# Patient Record
Sex: Female | Born: 1965
Health system: Southern US, Community
[De-identification: ages and names within clinical notes are randomized; demographics above are authoritative.]

## PROBLEM LIST (undated history)

## (undated) DIAGNOSIS — M5030 Other cervical disc degeneration, unspecified cervical region: Secondary | ICD-10-CM

## (undated) DIAGNOSIS — F32A Depression, unspecified: Secondary | ICD-10-CM

## (undated) DIAGNOSIS — R002 Palpitations: Secondary | ICD-10-CM

## (undated) DIAGNOSIS — F419 Anxiety disorder, unspecified: Secondary | ICD-10-CM

## (undated) DIAGNOSIS — M1990 Unspecified osteoarthritis, unspecified site: Secondary | ICD-10-CM

## (undated) DIAGNOSIS — D219 Benign neoplasm of connective and other soft tissue, unspecified: Secondary | ICD-10-CM

## (undated) DIAGNOSIS — Z905 Acquired absence of kidney: Secondary | ICD-10-CM

## (undated) DIAGNOSIS — M5126 Other intervertebral disc displacement, lumbar region: Secondary | ICD-10-CM

## (undated) DIAGNOSIS — E119 Type 2 diabetes mellitus without complications: Secondary | ICD-10-CM

## (undated) DIAGNOSIS — R0602 Shortness of breath: Secondary | ICD-10-CM

## (undated) DIAGNOSIS — K5900 Constipation, unspecified: Secondary | ICD-10-CM

## (undated) DIAGNOSIS — F329 Major depressive disorder, single episode, unspecified: Secondary | ICD-10-CM

## (undated) DIAGNOSIS — E739 Lactose intolerance, unspecified: Secondary | ICD-10-CM

## (undated) DIAGNOSIS — D649 Anemia, unspecified: Secondary | ICD-10-CM

## (undated) HISTORY — DX: Depression, unspecified: F32.A

## (undated) HISTORY — DX: Lactose intolerance, unspecified: E73.9

## (undated) HISTORY — DX: Unspecified osteoarthritis, unspecified site: M19.90

## (undated) HISTORY — DX: Other intervertebral disc displacement, lumbar region: M51.26

## (undated) HISTORY — DX: Constipation, unspecified: K59.00

## (undated) HISTORY — DX: Benign neoplasm of connective and other soft tissue, unspecified: D21.9

## (undated) HISTORY — DX: Anxiety disorder, unspecified: F41.9

## (undated) HISTORY — DX: Acquired absence of kidney: Z90.5

## (undated) HISTORY — PX: NEPHRECTOMY LIVING DONOR: SUR877

## (undated) HISTORY — DX: Shortness of breath: R06.02

## (undated) HISTORY — DX: Anemia, unspecified: D64.9

## (undated) HISTORY — DX: Palpitations: R00.2

## (undated) HISTORY — DX: Major depressive disorder, single episode, unspecified: F32.9

---

## 1993-05-09 HISTORY — PX: TUBAL LIGATION: SHX77

## 2005-05-22 HISTORY — PX: ABDOMINAL HYSTERECTOMY: SHX81

## 2007-09-12 DIAGNOSIS — Z905 Acquired absence of kidney: Secondary | ICD-10-CM

## 2007-09-12 HISTORY — DX: Acquired absence of kidney: Z90.5

## 2015-04-12 ENCOUNTER — Emergency Department (HOSPITAL_COMMUNITY)
Admission: EM | Admit: 2015-04-12 | Discharge: 2015-04-12 | Disposition: A | Discharge status: Home / Self Care | Payer: BLUE CROSS/BLUE SHIELD | Attending: Emergency Medicine | Admitting: Emergency Medicine

## 2015-04-12 ENCOUNTER — Encounter (HOSPITAL_COMMUNITY): Payer: Self-pay | Admitting: Emergency Medicine

## 2015-04-12 DIAGNOSIS — Z905 Acquired absence of kidney: Secondary | ICD-10-CM | POA: Insufficient documentation

## 2015-04-12 DIAGNOSIS — Z87448 Personal history of other diseases of urinary system: Secondary | ICD-10-CM | POA: Insufficient documentation

## 2015-04-12 DIAGNOSIS — M545 Low back pain: Secondary | ICD-10-CM | POA: Diagnosis present

## 2015-04-12 DIAGNOSIS — M5417 Radiculopathy, lumbosacral region: Secondary | ICD-10-CM | POA: Insufficient documentation

## 2015-04-12 MED ORDER — HYDROMORPHONE HCL 2 MG/ML IJ SOLN
2.0000 mg | Freq: Once | INTRAMUSCULAR | Status: AC
  Administered 2015-04-12: 2 mg via INTRAMUSCULAR
  Filled 2015-04-12: qty 1

## 2015-04-12 MED ORDER — OXYCODONE-ACETAMINOPHEN 5-325 MG PO TABS: 1.0000 | ORAL_TABLET | Freq: Four times a day (QID) | ORAL | Status: DC | PRN

## 2015-04-12 NOTE — ED Notes (Signed)
Pt c/o right sided leg pain x 6 days. She reports she was seen at another hospital and was given prednisone and hydrocodone for pain but is getting no relief.

## 2015-04-12 NOTE — ED Provider Notes (Signed)
CSN: [id number]     Arrival date & time 04/12/15  0408 History   First MD Initiated Contact with Patient 04/12/15 0413     Chief Complaint  Patient presents with  . Sciatica     (Consider location/radiation/quality/duration/timing/severity/associated sxs/prior Treatment) HPI Comments: Patient is a 49 year old female with history of solitary kidney. She presents with complaints of pain in her right buttock and right low back radiating into her leg. This is been present for the past week. She was seen at a hospital in [place] and started on prednisone and hydrocodone, however her pain is not improving. She denies any bowel or bladder complaints. She denies any weakness. She does report difficulty ambulating due to pain.  The history is provided by the patient.    Past Medical History  Diagnosis Date  . Renal disorder   . History of kidney removal    Past Surgical History  Procedure Laterality Date  . Kidney surgery    . Abdominal hysterectomy     No family history on file. Social History  Substance Use Topics  . Smoking status: Never Smoker   . Smokeless tobacco: None  . Alcohol Use: Yes     Comment: social   OB History    No data available     Review of Systems  All other systems reviewed and are negative.     Allergies  Review of patient's allergies indicates no known allergies.  Home Medications   Prior to Admission medications   Not on File   BP 146/91 mmHg  Pulse 80  Temp(Src) 97.4 F (36.3 C) (Oral)  Resp 18  SpO2 98% Physical Exam  Constitutional: She is oriented to person, place, and time. She appears well-developed and well-nourished. No distress.  HENT:  Head: Normocephalic and atraumatic.  Neck: Normal range of motion. Neck supple.  Musculoskeletal: Normal range of motion.  There is tenderness to palpation in the right buttock and right lumbar soft tissues.  Neurological: She is alert and oriented to person, place, and time.  Strength is 5  out of 5 in the bilateral lower extremities. DTRs are 2+ and symmetrical in the patellar and Achilles tendons. Is able to ambulate, however with discomfort.  Skin: Skin is warm and dry. She is not diaphoretic.  Nursing note and vitals reviewed.   ED Course  Procedures (including critical care time) Labs Review Labs Reviewed - No data to display  Imaging Review No results found. I have personally reviewed and evaluated these images and lab results as part of my medical decision-making.   EKG Interpretation None      MDM   Final diagnoses:  None    Patient presents with complaints that her consistent with a lumbosacral radiculopathy, possibly sciatica. There are no red flags in today's exam which would suggest an emergent situation. Her strength and reflexes are symmetrical and normal, and there are no bowel or bladder complaints. She will be given an injection of pain medication and discharged with Percocet. She is to follow-up with her primary doctor when she arrives at home in [place]. She is due to go back today.    Veryl Speak, MD 04/12/15 [phone]

## 2015-04-12 NOTE — Discharge Instructions (Signed)
Continue your prednisone as previously prescribed. Stop taking your hydrocodone.  Start Percocet as prescribed as needed for pain.  Follow-up with your primary doctor when you return home to discuss either further imaging or physical therapy if you're not improving.   Lumbosacral Radiculopathy Lumbosacral radiculopathy is a condition that involves the spinal nerves and nerve roots in the low back and bottom of the spine. The condition develops when these nerves and nerve roots move out of place or become inflamed and cause symptoms. CAUSES This condition may be caused by:  Pressure from a disk that bulges out of place (herniated disk). A disk is a plate of cartilage that separates bones in the spine.  Disk degeneration.  A narrowing of the bones of the lower back (spinal stenosis).  A tumor.  An infection.  An injury that places sudden pressure on the disks that cushion the bones of your lower spine. RISK FACTORS This condition is more likely to develop in:  Males aged 30-50 years.  Females aged 21-60 years.  People who lift improperly.  People who are overweight or live a sedentary lifestyle.  People who smoke.  People who perform repetitive activities that strain the spine. SYMPTOMS Symptoms of this condition include:  Pain that goes down from the back into the legs (sciatica). This is the most common symptom. The pain may be worse with sitting, coughing, or sneezing.  Pain and numbness in the arms and legs.  Muscle weakness.  Tingling.  Loss of bladder control or bowel control. DIAGNOSIS This condition is diagnosed with a physical exam and medical history. If the pain is lasting, you may have tests, such as:  MRI scan.  X-ray.  CT scan.  Myelogram.  Nerve conduction study. TREATMENT This condition is often treated with:  Hot packs and ice applied to affected areas.  Stretches to improve flexibility.  Exercises to strengthen back  muscles.  Physical therapy.  Pain medicine.  A steroid injection in the spine. In some cases, no treatment is needed. If the condition is long-lasting (chronic), or if symptoms are severe, treatment may involve surgery or lifestyle changes, such as following a weight loss plan. HOME CARE INSTRUCTIONS Medicines  Take medicines only as directed by your health care provider.  Do not drive or operate heavy machinery while taking pain medicine. Injury Care  Apply a heat pack to the injured area as directed by your health care provider.  Apply ice to the affected area:  Put ice in a plastic bag.  Place a towel between your skin and the bag.  Leave the ice on for 20-30 minutes, every 2 hours while you are awake or as needed. Or, leave the ice on for as long as directed by your health care provider. Other Instructions  If you were shown how to do any exercises or stretches, do them as directed by your health care provider.  If your health care provider prescribed a diet or exercise program, follow it as directed.  Keep all follow-up visits as directed by your health care provider. This is important. SEEK MEDICAL CARE IF:  Your pain does not improve over time even when taking pain medicines. SEEK IMMEDIATE MEDICAL CARE IF:  Your develop severe pain.  Your pain suddenly gets worse.  You develop increasing weakness in your legs.  You lose the ability to control your bladder or bowel.  You have difficulty walking or balancing.  You have a fever.   This information is not intended to  replace advice given to you by your health care provider. Make sure you discuss any questions you have with your health care provider.   Document Released: 05/08/2005 Document Revised: 09/22/2014 Document Reviewed: 05/04/2014 Elsevier Interactive Patient Education [employer].

## 2015-04-20 DIAGNOSIS — M5416 Radiculopathy, lumbar region: Secondary | ICD-10-CM | POA: Insufficient documentation

## 2015-06-18 DIAGNOSIS — G8929 Other chronic pain: Secondary | ICD-10-CM | POA: Insufficient documentation

## 2015-06-18 DIAGNOSIS — M5126 Other intervertebral disc displacement, lumbar region: Secondary | ICD-10-CM

## 2015-06-18 DIAGNOSIS — M545 Low back pain, unspecified: Secondary | ICD-10-CM | POA: Insufficient documentation

## 2015-06-18 HISTORY — DX: Other intervertebral disc displacement, lumbar region: M51.26

## 2015-06-28 DIAGNOSIS — M5416 Radiculopathy, lumbar region: Secondary | ICD-10-CM | POA: Diagnosis not present

## 2015-07-09 MED FILL — AMOXICILLIN 500 MG CAPSULE: 500 | 7 days supply | Qty: 30 | Fill #0

## 2015-07-13 DIAGNOSIS — M5127 Other intervertebral disc displacement, lumbosacral region: Secondary | ICD-10-CM | POA: Diagnosis not present

## 2015-07-13 HISTORY — PX: SPINE SURGERY: SHX786

## 2015-08-15 ENCOUNTER — Ambulatory Visit (INDEPENDENT_AMBULATORY_CARE_PROVIDER_SITE_OTHER): Payer: 59 | Admitting: Physician Assistant

## 2015-08-15 VITALS — BP 130/72 | HR 92 | Temp 97.9°F | Resp 16 | Ht 68.0 in | Wt 201.0 lb

## 2015-08-15 DIAGNOSIS — R05 Cough: Secondary | ICD-10-CM | POA: Diagnosis not present

## 2015-08-15 DIAGNOSIS — R059 Cough, unspecified: Secondary | ICD-10-CM

## 2015-08-15 DIAGNOSIS — J069 Acute upper respiratory infection, unspecified: Secondary | ICD-10-CM | POA: Diagnosis not present

## 2015-08-15 MED ORDER — PSEUDOEPHEDRINE-GUAIFENESIN ER 120-1200 MG PO TB12: 1.0000 | ORAL_TABLET | Freq: Two times a day (BID) | ORAL | Status: DC

## 2015-08-15 MED ORDER — BENZONATATE 100 MG PO CAPS: 100.0000 mg | ORAL_CAPSULE | Freq: Three times a day (TID) | ORAL | Status: DC | PRN

## 2015-08-15 NOTE — Patient Instructions (Signed)
Please take the mucinex-d twice daily for the next few days. Drink lots of water while you take this. Taking the tessalon every 8 hours will help with cough. If you're not better in 4 days or you start to feel worse with fevers please give [place] a call, you may require antibiotics at that time.   Upper Respiratory Infection, Adult Most upper respiratory infections (URIs) are a viral infection of the air passages leading to the lungs. A URI affects the nose, throat, and upper air passages. The most common type of URI is nasopharyngitis and is typically referred to as "the common cold." URIs run their course and usually go away on their own. Most of the time, a URI does not require medical attention, but sometimes a bacterial infection in the upper airways can follow a viral infection. This is called a secondary infection. Sinus and middle ear infections are common types of secondary upper respiratory infections. Bacterial pneumonia can also complicate a URI. A URI can worsen asthma and chronic obstructive pulmonary disease (COPD). Sometimes, these complications can require emergency medical care and may be life threatening.  CAUSES Almost all URIs are caused by viruses. A virus is a type of germ and can spread from one person to another.  RISKS FACTORS You may be at risk for a URI if:   You smoke.   You have chronic heart or lung disease.  You have a weakened defense (immune) system.   You are very young or very old.   You have nasal allergies or asthma.  You work in crowded or poorly ventilated areas.  You work in health care facilities or schools. SIGNS AND SYMPTOMS  Symptoms typically develop 2-3 days after you come in contact with a cold virus. Most viral URIs last 7-10 days. However, viral URIs from the influenza virus (flu virus) can last 14-18 days and are typically more severe. Symptoms may include:   Runny or stuffy (congested) nose.   Sneezing.   Cough.   Sore throat.    Headache.   Fatigue.   Fever.   Loss of appetite.   Pain in your forehead, behind your eyes, and over your cheekbones (sinus pain).  Muscle aches.  DIAGNOSIS  Your health care provider may diagnose a URI by:  Physical exam.  Tests to check that your symptoms are not due to another condition such as:  Strep throat.  Sinusitis.  Pneumonia.  Asthma. TREATMENT  A URI goes away on its own with time. It cannot be cured with medicines, but medicines may be prescribed or recommended to relieve symptoms. Medicines may help:  Reduce your fever.  Reduce your cough.  Relieve nasal congestion. HOME CARE INSTRUCTIONS   Take medicines only as directed by your health care provider.   Gargle warm saltwater or take cough drops to comfort your throat as directed by your health care provider.  Use a warm mist humidifier or inhale steam from a shower to increase air moisture. This may make it easier to breathe.  Drink enough fluid to keep your urine clear or pale yellow.   Eat soups and other clear broths and maintain good nutrition.   Rest as needed.   Return to work when your temperature has returned to normal or as your health care provider advises. You may need to stay home longer to avoid infecting others. You can also use a face mask and careful hand washing to prevent spread of the virus.  Increase the usage of your  inhaler if you have asthma.   Do not use any tobacco products, including cigarettes, chewing tobacco, or electronic cigarettes. If you need help quitting, ask your health care provider. PREVENTION  The best way to protect yourself from getting a cold is to practice good hygiene.   Avoid oral or hand contact with people with cold symptoms.   Wash your hands often if contact occurs.  There is no clear evidence that vitamin C, vitamin E, echinacea, or exercise reduces the chance of developing a cold. However, it is always recommended to get plenty  of rest, exercise, and practice good nutrition.  SEEK MEDICAL CARE IF:   You are getting worse rather than better.   Your symptoms are not controlled by medicine.   You have chills.  You have worsening shortness of breath.  You have brown or red mucus.  You have yellow or brown nasal discharge.  You have pain in your face, especially when you bend forward.  You have a fever.  You have swollen neck glands.  You have pain while swallowing.  You have white areas in the back of your throat. SEEK IMMEDIATE MEDICAL CARE IF:   You have severe or persistent:  Headache.  Ear pain.  Sinus pain.  Chest pain.  You have chronic lung disease and any of the following:  Wheezing.  Prolonged cough.  Coughing up blood.  A change in your usual mucus.  You have a stiff neck.  You have changes in your:  Vision.  Hearing.  Thinking.  Mood. MAKE SURE YOU:   Understand these instructions.  Will watch your condition.  Will get help right away if you are not doing well or get worse.   This information is not intended to replace advice given to you by your health care provider. Make sure you discuss any questions you have with your health care provider.   Document Released: 11/01/2000 Document Revised: 09/22/2014 Document Reviewed: 08/13/2013 Elsevier Interactive Patient Education [employer].

## 2015-08-15 NOTE — Progress Notes (Signed)
   Subjective:    Patient ID: Sarah Duncan, female    DOB: [birth year], 50 y.o.   MRN: [id number]  Chief Complaint  Patient presents with  . Cough    body aches/ cough since Monday   Medications, allergies, past medical history, surgical history, family history, social history and problem list reviewed and updated.  HPI  50 yof presents with above complaints.   Symptoms started gradually 6 days ago with non prod cough, sneezing, generalized body aches, chills, and pressure behind eyes. Persistent. No fevers. Denies sick contacts. Denies abd pain, n/v, diarrhea, otalgia, sore throat.   Review of Systems See HPI     Objective:   Physical Exam  Constitutional: She appears well-developed and well-nourished.  Non-toxic appearance. She does not have a sickly appearance. She does not appear ill. No distress.  BP 130/72 mmHg  Pulse 92  Temp(Src) 97.9 F (36.6 C)  Resp 16  Ht 5\' 8"  (1.727 m)  Wt 201 lb (91.173 kg)  BMI 30.57 kg/m2  SpO2 98%   HENT:  Right Ear: Tympanic membrane normal.  Left Ear: Tympanic membrane normal.  Nose: No mucosal edema or rhinorrhea. Right sinus exhibits no maxillary sinus tenderness and no frontal sinus tenderness. Left sinus exhibits no maxillary sinus tenderness and no frontal sinus tenderness.  Mouth/Throat: Uvula is midline, oropharynx is clear and moist and mucous membranes are normal.  Pulmonary/Chest: Effort normal and breath sounds normal. No tachypnea.      Assessment & Plan:   Viral URI - Plan: Pseudoephedrine-Guaifenesin (MUCINEX D) [phone] MG TB12  Cough - Plan: benzonatate (TESSALON) 100 MG capsule --suspect viral uri vs possible allergic rhinitis --mucinex d, tessalon, discussed flonase and daily antihistamine --let us know if no improvement 4-5 days or start feeling worse  Julieta Gutting, PA-C Physician Assistant-Certified [facility]  08/16/2015 9:17 AM

## 2015-08-16 ENCOUNTER — Encounter: Payer: Self-pay | Admitting: Physician Assistant

## 2015-10-12 ENCOUNTER — Encounter: Payer: Self-pay | Admitting: Physician Assistant

## 2015-10-12 ENCOUNTER — Ambulatory Visit (INDEPENDENT_AMBULATORY_CARE_PROVIDER_SITE_OTHER): Payer: 59 | Admitting: Family Medicine

## 2015-10-12 VITALS — BP 122/78 | HR 80 | Temp 98.2°F | Resp 16 | Ht 67.5 in | Wt 207.6 lb

## 2015-10-12 DIAGNOSIS — M545 Low back pain, unspecified: Secondary | ICD-10-CM

## 2015-10-12 DIAGNOSIS — Z13220 Encounter for screening for lipoid disorders: Secondary | ICD-10-CM | POA: Diagnosis not present

## 2015-10-12 DIAGNOSIS — R635 Abnormal weight gain: Secondary | ICD-10-CM

## 2015-10-12 DIAGNOSIS — Z7689 Persons encountering health services in other specified circumstances: Secondary | ICD-10-CM

## 2015-10-12 DIAGNOSIS — R6889 Other general symptoms and signs: Secondary | ICD-10-CM

## 2015-10-12 DIAGNOSIS — R002 Palpitations: Secondary | ICD-10-CM

## 2015-10-12 DIAGNOSIS — Z0000 Encounter for general adult medical examination without abnormal findings: Secondary | ICD-10-CM

## 2015-10-12 DIAGNOSIS — Z23 Encounter for immunization: Secondary | ICD-10-CM | POA: Diagnosis not present

## 2015-10-12 DIAGNOSIS — G8929 Other chronic pain: Secondary | ICD-10-CM

## 2015-10-12 DIAGNOSIS — Z13228 Encounter for screening for other metabolic disorders: Secondary | ICD-10-CM | POA: Diagnosis not present

## 2015-10-12 DIAGNOSIS — Z113 Encounter for screening for infections with a predominantly sexual mode of transmission: Secondary | ICD-10-CM

## 2015-10-12 DIAGNOSIS — Z114 Encounter for screening for human immunodeficiency virus [HIV]: Secondary | ICD-10-CM | POA: Diagnosis not present

## 2015-10-12 DIAGNOSIS — Z1329 Encounter for screening for other suspected endocrine disorder: Secondary | ICD-10-CM

## 2015-10-12 DIAGNOSIS — Z130 Encounter for screening for diseases of the blood and blood-forming organs and certain disorders involving the immune mechanism: Secondary | ICD-10-CM | POA: Diagnosis not present

## 2015-10-12 DIAGNOSIS — Z1159 Encounter for screening for other viral diseases: Secondary | ICD-10-CM | POA: Diagnosis not present

## 2015-10-12 DIAGNOSIS — Z905 Acquired absence of kidney: Secondary | ICD-10-CM

## 2015-10-12 LAB — CBC WITH DIFFERENTIAL/PLATELET
Basophils Absolute: 0 cells/uL (ref 0–200)
Basophils Relative: 0 %
EOS PCT: 2 %
Eosinophils Absolute: 122 cells/uL (ref 15–500)
HCT: 40.8 % (ref 35.0–45.0)
HEMOGLOBIN: 13.7 g/dL (ref 11.7–15.5)
LYMPHS ABS: 2745 {cells}/uL (ref 850–3900)
Lymphocytes Relative: 45 %
MCH: 28.2 pg (ref 27.0–33.0)
MCHC: 33.6 g/dL (ref 32.0–36.0)
MCV: 84.1 fL (ref 80.0–100.0)
MPV: 10.4 fL (ref 7.5–12.5)
Monocytes Absolute: 610 cells/uL (ref 200–950)
Monocytes Relative: 10 %
NEUTROS ABS: 2623 {cells}/uL (ref 1500–7800)
Neutrophils Relative %: 43 %
PLATELETS: 260 10*3/uL (ref 140–400)
RBC: 4.85 MIL/uL (ref 3.80–5.10)
RDW: 13.8 % (ref 11.0–15.0)
WBC: 6.1 10*3/uL (ref 3.8–10.8)

## 2015-10-12 LAB — POC MICROSCOPIC URINALYSIS (UMFC): MUCUS RE: ABSENT

## 2015-10-12 LAB — LIPID PANEL
CHOL/HDL RATIO: 3.6 ratio (ref <=5.0)
Cholesterol: 195 mg/dL (ref 125–200)
HDL: 54 mg/dL (ref >=46)
LDL CALC: 122 mg/dL (ref <130)
TRIGLYCERIDES: 96 mg/dL (ref <150)
VLDL: 19 mg/dL (ref <30)

## 2015-10-12 LAB — COMPREHENSIVE METABOLIC PANEL
ALBUMIN: 4.3 g/dL (ref 3.6–5.1)
ALT: 5 U/L — ABNORMAL LOW (ref 6–29)
AST: 13 U/L (ref 10–35)
Alkaline Phosphatase: 106 U/L (ref 33–115)
BILIRUBIN TOTAL: 0.3 mg/dL (ref 0.2–1.2)
BUN: 10 mg/dL (ref 7–25)
CO2: 23 mmol/L (ref 20–31)
CREATININE: 1.01 mg/dL (ref 0.50–1.10)
Calcium: 9.9 mg/dL (ref 8.6–10.2)
Chloride: 102 mmol/L (ref 98–110)
Glucose, Bld: 94 mg/dL (ref 65–99)
Potassium: 4.1 mmol/L (ref 3.5–5.3)
SODIUM: 137 mmol/L (ref 135–146)
TOTAL PROTEIN: 7.5 g/dL (ref 6.1–8.1)

## 2015-10-12 LAB — POCT URINALYSIS DIP (MANUAL ENTRY)
BILIRUBIN UA: NEGATIVE
BILIRUBIN UA: NEGATIVE
Blood, UA: NEGATIVE
GLUCOSE UA: NEGATIVE
LEUKOCYTES UA: NEGATIVE
Nitrite, UA: NEGATIVE
Protein Ur, POC: NEGATIVE
SPEC GRAV UA: 1.02
Urobilinogen, UA: 0.2
pH, UA: 5.5

## 2015-10-12 NOTE — Patient Instructions (Signed)
Check the dose of gabapentin-we may be able to increase it.  Bring the records from your hospitalization after your back surgery and a copy of the neurosurgeon's note.    IF you received an x-ray today, you will receive an invoice from [facility]. Please contact [facility] at [phone] with questions or concerns regarding your invoice.   IF you received labwork today, you will receive an invoice from [employer]. Please contact [facility] at [phone] with questions or concerns regarding your invoice.   Our billing staff will not be able to assist you with questions regarding bills from these companies.  You will be contacted with the lab results as soon as they are available. The fastest way to get your results is to activate your My Chart account. Instructions are located on the last page of this paperwork. If you have not heard from [place] regarding the results in 2 weeks, please contact this office.

## 2015-10-12 NOTE — Progress Notes (Signed)
Patient ID: Sarah Duncan, female    DOB: 1965-10-24, 50 y.o.   MRN: [id number]  PCP: No PCP Per Patient  Chief Complaint  Patient presents with  . Annual Exam    Subjective:   HPI: Presents to establish for primary care and for Annual Wellness visit. She is accompanied by her wife, Denyse Dago.  Cervical Cancer Screening: no longer a candidate. Breast Cancer Screening: mammogram in 2008 in preparation for renal transplant; lump, benign by biopsy. Colorectal Cancer Screening: not yet a candidate Bone Density Testing: not yet a candidate HIV Screening: due STI Screening: due Seasonal Influenza Vaccination: "I don't do 'em" Td/Tdap Vaccination: unknown last dose  Pneumococcal Vaccination: not yet a candidate Zoster Vaccination: not yet a candidate Frequency of Dental evaluation: complete edentula, fully compensdated. Frequency of Eye evaluation: last visit 2 years ago.   Patient Active Problem List   Diagnosis Date Noted  . Chronic lower back pain 06/18/2015    Past Medical History  Diagnosis Date  . History of kidney removal   . 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 Fibroids      Prior to Admission medications   Medication Sig Start Date End Date Taking? Authorizing Provider  Acetaminophen (TYLENOL ARTHRITIS PAIN PO) Take 650 mg by mouth daily.   Yes Historical Provider, MD  acetaminophen (TYLENOL) 650 MG CR tablet Take 650 mg by mouth every 8 (eight) hours as needed for pain.   Yes Historical Provider, MD  BIOTIN PO Take by mouth daily.   Yes Historical Provider, MD  Gabapentin (NEURONTIN PO) Take by mouth 3 (three) times daily.   Yes Historical Provider, MD  MELATONIN PO Take by mouth daily.   Yes Historical Provider, MD  Multiple Vitamin (MULTIVITAMIN) tablet Take 1 tablet by mouth daily.   Yes Historical Provider, MD    No Known Allergies  Past Surgical History  Procedure Laterality Date  . 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 Cesarean section  11/19/1989  . Tubal ligation  05/09/1993  . Abdominal hysterectomy  2007  . Spine surgery  07/13/2015    Family History  Problem Relation Age of Onset  . Diabetes Mother   . Hypertension Mother   . Diabetes Brother   . Hypertension Brother   . Cancer Father   . Endometriosis Daughter   . Diabetes Daughter   . HIV Son     Social History   Social History  . Marital Status: Married    Spouse Name: Ronni Rumble  . Number of Children: 4  . Years of Education: associates   Occupational History  . [employer]     out of work since 03/2015   Social History Main Topics  . Smoking status: Former Smoker -- 1 years    Types: Cigarettes  . Smokeless tobacco: Never Used  . Alcohol Use: 0.6 oz/week    1 Standard drinks or equivalent per week     Comment: social  . Drug Use: No  . Sexual Activity:    Partners: Female   Other Topics Concern  . None   Social History Narrative   Divorced. 4 adult children (3 in [place], one in [place]).   Re-Married 05/2015. Lives with her wife.   Her wife's children live locally.       Review of Systems  Constitutional: Positive for unexpected weight change (gain, since 03/2015; more eating, less exercise (my work was my exercise);  milkshakes). Negative for fever, chills, diaphoresis, activity change, appetite change and fatigue.  HENT: Negative.   Eyes: Positive for visual disturbance (has bifocals, doesn't wear them every day, last eye exam 2 years ago.). Negative for photophobia, pain, discharge, redness and itching.  Respiratory: Negative.   Cardiovascular: Positive for palpitations (began in 2010, wore a monitor, pacemaker considered, but she thinks it was due to stress at the time; have recurred with recent stress). Negative for chest pain and leg swelling.  Gastrointestinal: Negative.   Endocrine: Positive for cold intolerance. Negative for heat intolerance, polydipsia, polyphagia and  polyuria.  Genitourinary: Negative.   Musculoskeletal: Positive for back pain. Negative for myalgias, joint swelling, arthralgias, gait problem, neck pain and neck stiffness.  Skin: Negative.   Allergic/Immunologic: Negative.   Neurological: Positive for dizziness (occurs when she skips meals) and light-headedness (once a day; rapid position changes). Negative for tremors, seizures, syncope, facial asymmetry, speech difficulty, weakness, numbness and headaches.       "I have been told I was anemic."  Hematological: Negative.   Psychiatric/Behavioral: Negative for suicidal ideas, hallucinations, behavioral problems, confusion, sleep disturbance, self-injury, dysphoric mood, decreased concentration and agitation. The patient is not hyperactive. Nervous/anxious: back pain, job, finances.         Objective:  Physical Exam  Constitutional: She is oriented to person, place, and time. Vital signs are normal. She appears well-developed and well-nourished. She is active and cooperative. No distress.  BP 122/78 mmHg  Pulse 80  Temp(Src) 98.2 F (36.8 C) (Oral)  Resp 16  Ht 5' 7.5" (1.715 m)  Wt 207 lb 9.6 oz (94.167 kg)  BMI 32.02 kg/m2  SpO2 98%   HENT:  Head: Normocephalic and atraumatic.  Right Ear: Hearing, tympanic membrane, external ear and ear canal normal. No foreign bodies.  Left Ear: Hearing, tympanic membrane, external ear and ear canal normal. No foreign bodies.  Nose: Nose normal.  Mouth/Throat: Uvula is midline, oropharynx is clear and moist and mucous membranes are normal. She has dentures. No oral lesions. Normal dentition. No dental abscesses or uvula swelling. No oropharyngeal exudate.  Eyes: Conjunctivae, EOM and lids are normal. Pupils are equal, round, and reactive to light. Right eye exhibits no discharge. Left eye exhibits no discharge. No scleral icterus.  Fundoscopic exam:      The right eye shows no arteriolar narrowing, no AV nicking, no exudate, no hemorrhage and  no papilledema. The right eye shows red reflex.       The left eye shows no arteriolar narrowing, no AV nicking, no exudate, no hemorrhage and no papilledema. The left eye shows red reflex.  Neck: Trachea normal, normal range of motion and full passive range of motion without pain. Neck supple. No spinous process tenderness and no muscular tenderness present. No thyroid mass and no thyromegaly present.  Cardiovascular: Normal rate, regular rhythm, normal heart sounds, intact distal pulses and normal pulses.   Pulmonary/Chest: Effort normal and breath sounds normal. Right breast exhibits no inverted nipple, no mass, no nipple discharge, no skin change and no tenderness. Left breast exhibits no inverted nipple, no mass, no nipple discharge, no skin change and no tenderness. Breasts are symmetrical.    Musculoskeletal: She exhibits no edema or tenderness.       Cervical back: Normal.       Thoracic back: Normal.       Lumbar back: Normal.  Lymphadenopathy:       Head (right side): No tonsillar, no preauricular, no posterior auricular  and no occipital adenopathy present.       Head (left side): No tonsillar, no preauricular, no posterior auricular and no occipital adenopathy present.    She has no cervical adenopathy.       Right: No supraclavicular adenopathy present.       Left: No supraclavicular adenopathy present.  Neurological: She is alert and oriented to person, place, and time. She has normal strength and normal reflexes. No cranial nerve deficit. She exhibits normal muscle tone. Coordination and gait normal.  Skin: Skin is warm, dry and intact. No rash noted. She is not diaphoretic. No cyanosis or erythema. Nails show no clubbing.  Psychiatric: She has a normal mood and affect. Her speech is normal and behavior is normal. Judgment and thought content normal.      EKG reviewed with Dr. Tamala Julian. NSR.     Assessment & Plan:  1. Annual physical exam 2. Encounter to establish care Age  appropriate anticipatory guidance provided.  3. Need for Tdap vaccination - Tdap vaccine greater than or equal to 7yo IM  4. Screening for HIV (human immunodeficiency virus) - HIV antibody  5. Routine screening for STI (sexually transmitted infection) - GC/Chlamydia Probe Amp - Hepatitis B surface antibody - Hepatitis B surface antigen - Hepatitis C antibody - RPR  6. Screening for hyperlipidemia - Lipid panel  7. Screening for thyroid disorder See below  8. Screening for deficiency anemia - CBC with Differential/Platelet  9. Screening for metabolic disorder - Comprehensive metabolic panel - POCT urinalysis dipstick - POCT Microscopic Urinalysis ([facility])  10. Single kidney - Comprehensive metabolic panel  11. Chronic lower back pain She will get her previous records regarding this and the surgery she had recently. - Ambulatory referral to Physical Therapy  12. Weight gain Likely due to reduced activity and increased caloric intake. - TSH  13. Cold intolerance Await TSH. - TSH  14. Heart palpitations Normal EKG here today. Get previous records. Likely stress induced. - TSH - EKG 12-Lead   Fara Chute, PA-C Physician Assistant-Certified [facility]

## 2015-10-13 LAB — HIV ANTIBODY (ROUTINE TESTING W REFLEX): HIV 1&2 Ab, 4th Generation: NONREACTIVE

## 2015-10-13 LAB — HEPATITIS C ANTIBODY: HCV Ab: NEGATIVE

## 2015-10-13 LAB — TSH: TSH: 2.79 mIU/L

## 2015-10-13 LAB — GC/CHLAMYDIA PROBE AMP
CT Probe RNA: NOT DETECTED
GC Probe RNA: NOT DETECTED

## 2015-10-13 LAB — RPR

## 2015-10-13 LAB — HEPATITIS B SURFACE ANTIBODY, QUANTITATIVE: Hepatitis B-Post: 0 m[IU]/mL

## 2015-10-13 LAB — HEPATITIS B SURFACE ANTIGEN: HEP B S AG: NEGATIVE

## 2015-10-14 ENCOUNTER — Encounter: Payer: Self-pay | Admitting: Physician Assistant

## 2015-10-19 ENCOUNTER — Telehealth: Payer: Self-pay

## 2015-10-19 NOTE — Telephone Encounter (Signed)
Received 34 pages from Dr. Jacqlyn Larsen office

## 2015-10-28 ENCOUNTER — Encounter: Payer: Self-pay | Admitting: Physical Therapy

## 2015-10-28 ENCOUNTER — Ambulatory Visit: Payer: 59 | Attending: Family Medicine | Admitting: Physical Therapy

## 2015-10-28 DIAGNOSIS — R262 Difficulty in walking, not elsewhere classified: Secondary | ICD-10-CM | POA: Insufficient documentation

## 2015-10-28 DIAGNOSIS — R252 Cramp and spasm: Secondary | ICD-10-CM | POA: Diagnosis not present

## 2015-10-28 DIAGNOSIS — M545 Low back pain, unspecified: Secondary | ICD-10-CM

## 2015-10-28 NOTE — Therapy (Signed)
[place], [place], [address] Phone: [phone]   Fax:  [phone]  Physical Therapy Evaluation  Patient Details  Name: Sarah Duncan MRN: [id number] Date of Birth: 02-02-1966 Referring Provider: C. Jacqulynn Cadet  Encounter Date: 10/28/2015      PT End of Session - 10/28/15 1129    Visit Number 1   Date for PT Re-Evaluation 12/28/15   PT Start Time 1100   PT Stop Time 1155   PT Time Calculation (min) 55 min   Activity Tolerance Patient tolerated treatment well   Behavior During Therapy [facility] for tasks assessed/performed      Past Medical History  Diagnosis Date  . History of kidney removal   . 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 Fibroids     Past Surgical History  Procedure Laterality Date  . Nephrectomy living donor Left     donated to her cousin  . Cesarean section  11/19/1989  . Tubal ligation  05/09/1993  . Abdominal hysterectomy  2007  . Spine surgery  07/13/2015    There were no vitals filed for this visit.       Subjective Assessment - 10/28/15 1104    Subjective Patient reports that she has had low back pain for a few years.  She was found to have a ruptured disc and underwent a lumbar surgery, she is unsure of the type of surgery but denies fusion.  Reports that since the surgery the pain in her right leg is gone but now pain and pressure in the low back   Limitations Standing;Sitting;House hold activities   How long can you sit comfortably? 45 minutes   How long can you stand comfortably? 10 minutes   How long can you walk comfortably? 10 minutes   Patient Stated Goals have less pain   Currently in Pain? Yes   Pain Score 8    Pain Location Back   Pain Orientation Lower   Pain Descriptors / Indicators Aching   Pain Type Chronic pain   Pain Onset More than a month ago   Pain Frequency Constant   Aggravating Factors  standing and walking >  10 minutes pain will be up to 10/10, does not take pain meds due to only having one kidney   Pain Relieving Factors really reports nothing helps but at best a 6/10    Effect of Pain on Daily Activities limits everything            [facility] PT Assessment - 10/28/15 0001    Assessment   Medical Diagnosis LBP   Referring Provider Darlin Coco   Onset Date/Surgical Date 09/27/15   Prior Therapy no   Precautions   Precautions None   Balance Screen   Has the patient fallen in the past 6 months No   Has the patient had a decrease in activity level because of a fear of falling?  No   Is the patient reluctant to leave their home because of a fear of falling?  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#, recently was let go secondary to being out with the LBP since November   Leisure no exercise  Posture/Postural Control   Posture Comments increased lordosis   ROM / Strength   AROM / PROM / Strength AROM;Strength   AROM   Overall AROM Comments Lumbar ROM was decreased >75% with low back pain   Strength   Overall Strength Comments 3+/5 for the LE's with some LBP   Flexibility   Soft Tissue Assessment /Muscle Length --  she is very tight in the calves, HS and piriformis mms   Palpation   Palpation comment very tender in the lumbar parapsinals and into the buttocks                   [facility] Adult PT Treatment/Exercise - 10/28/15 0001    Modalities   Modalities Moist Heat;Electrical Stimulation   Moist Heat Therapy   Number Minutes Moist Heat 15 Minutes   Moist Heat Location Lumbar Spine   Electrical Stimulation   Electrical Stimulation Location Lumbar area   Electrical Stimulation Action IFC   Electrical Stimulation Parameters sitting   Electrical Stimulation  Goals Pain                PT Education - 10/28/15 1128    Education provided Yes   Education Details Wms flexion exercise   Person(s) Educated Patient   Methods Explanation;Demonstration;Handout   Comprehension Verbalized understanding;Returned demonstration          PT Short Term Goals - 10/28/15 1133    PT SHORT TERM GOAL #1   Title independent with initial HEP   Time 2   Period Weeks   Status New           PT Long Term Goals - 10/28/15 1133    PT LONG TERM GOAL #1   Title understand posture and body mechanics   Time 8   Period Weeks   Status New   PT LONG TERM GOAL #2   Title decrease pain 50%   Time 8   Period Weeks   Status New   PT LONG TERM GOAL #3   Title increase lumbar ROM 50%   Time 8   Period Weeks   Status New   PT LONG TERM GOAL #4   Title tolerate grocery shopping without increase of pain   Time 8   Period Weeks   Status New   PT LONG TERM GOAL #5   Title increase LE strength to 4/5   Time 8   Period Weeks   Status New               Plan - 10/28/15 1129    Clinical Impression Statement Patient reports LBP for a number of years, she reports she had a back surgery in February.  She reports that it helped the leg pain she was having but she has continued to have LBP, pain is rated high with minimal changes.  She is very limited in her ROM.   Rehab Potential Good   PT Frequency 2x / week   PT Duration 8 weeks   PT Treatment/Interventions ADLs/Self Care Home Management;Cryotherapy;Electrical Stimulation;Moist Heat;Therapeutic exercise;Therapeutic activities;Functional mobility training;Ultrasound;Traction;Neuromuscular re-education;Patient/family education;Manual techniques   PT Next Visit Plan slowly add exercises, could try traction   Consulted and Agree with Plan of Care Patient      Patient will benefit from skilled therapeutic intervention in order to improve the following deficits and impairments:  Decreased mobility,  Decreased range of motion, Difficulty walking, Decreased strength, Impaired flexibility, Increased muscle spasms, Pain, Improper body mechanics  Visit Diagnosis: Bilateral low  back pain without sciatica - Plan: PT plan of care cert/re-cert  Difficulty in walking, not elsewhere classified - Plan: PT plan of care cert/re-cert  Cramp and spasm - Plan: PT plan of care cert/re-cert     Problem List Patient Active Problem List   Diagnosis Date Noted  . 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 Chronic lower back pain 06/18/2015    Sumner Boast., PT 10/28/2015, 11:36 AM  [address] [place], [place], [address] Phone: [phone]   Fax:  [phone]  Name: Sarah Duncan MRN: [id number] Date of Birth: April 29, 1966

## 2015-11-03 ENCOUNTER — Ambulatory Visit: Payer: 59 | Admitting: Physical Therapy

## 2015-11-09 ENCOUNTER — Encounter: Payer: Self-pay | Admitting: Physician Assistant

## 2015-11-11 ENCOUNTER — Ambulatory Visit: Payer: 59 | Admitting: Physical Therapy

## 2015-11-11 ENCOUNTER — Encounter: Payer: Self-pay | Admitting: Physical Therapy

## 2015-11-11 DIAGNOSIS — M545 Low back pain, unspecified: Secondary | ICD-10-CM

## 2015-11-11 DIAGNOSIS — R252 Cramp and spasm: Secondary | ICD-10-CM | POA: Diagnosis not present

## 2015-11-11 DIAGNOSIS — R262 Difficulty in walking, not elsewhere classified: Secondary | ICD-10-CM | POA: Diagnosis not present

## 2015-11-11 NOTE — Therapy (Signed)
[place], [place], [address] Phone: [phone]   Fax:  [phone]  Physical Therapy Treatment  Patient Details  Name: Sarah Duncan MRN: [id number] Date of Birth: 1966-03-04 Referring Provider: C. Jacqulynn Cadet  Encounter Date: 11/11/2015      PT End of Session - 11/11/15 1132    Visit Number 2   Date for PT Re-Evaluation 12/28/15   PT Start Time 1100   PT Stop Time 1148   PT Time Calculation (min) 48 min   Activity Tolerance Patient tolerated treatment well   Behavior During Therapy [facility] for tasks assessed/performed      Past Medical History  Diagnosis Date  . History of kidney removal   . 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 Fibroids     Past Surgical History  Procedure Laterality Date  . Nephrectomy living donor Left     donated to her cousin  . Cesarean section  11/19/1989  . Tubal ligation  05/09/1993  . Abdominal hysterectomy  2007  . Spine surgery  07/13/2015    There were no vitals filed for this visit.      Subjective Assessment - 11/11/15 1105    Subjective Pt reports no change since evaluation. Pt report that doing her HEP hurts   Currently in Pain? Yes   Pain Score 7    Pain Location Back   Pain Orientation Lower                         [facility] Adult PT Treatment/Exercise - 11/11/15 0001    Exercises   Exercises Lumbar;Knee/Hip   Lumbar Exercises: Stretches   Passive Hamstring Stretch 3 reps;10 seconds  seated position   Piriformis Stretch 3 reps;10 seconds  seated   Lumbar Exercises: Machines for Strengthening   Cybex Knee Extension 5lb x5   Cybex Knee Flexion 15lb x5   Other Lumbar Machine Exercise Seated rows & lats 20lb 2x10   Knee/Hip Exercises: Aerobic   Nustep L4 x6 minutes   Modalities   Modalities Moist Heat;Electrical Stimulation   Moist Heat Therapy   Number Minutes Moist Heat 15 Minutes   Moist  Heat Location Lumbar Spine   Electrical Stimulation   Electrical Stimulation Location Lumbar area   Electrical Stimulation Action IFC   Electrical Stimulation Parameters sitting   Electrical Stimulation Goals Pain                  PT Short Term Goals - 10/28/15 1133    PT SHORT TERM GOAL #1   Title independent with initial HEP   Time 2   Period Weeks   Status New           PT Long Term Goals - 10/28/15 1133    PT LONG TERM GOAL #1   Title understand posture and body mechanics   Time 8   Period Weeks   Status New   PT LONG TERM GOAL #2   Title decrease pain 50%   Time 8   Period Weeks   Status New   PT LONG TERM GOAL #3   Title increase lumbar ROM 50%   Time 8   Period Weeks   Status New   PT LONG TERM GOAL #4   Title tolerate grocery shopping without increase of pain   Time 8   Period Weeks   Status New   PT  LONG TERM GOAL #5   Title increase LE strength to 4/5   Time 8   Period Weeks   Status New               Plan - 11/11/15 1133    Clinical Impression Statement Pt with pain throughout treatment. Pt reports that lying down causes increase pain, "I sleep sitting up in the bed". Pt reports only minor increase in pain with scapular stabilization intervention. Increase pain with leg curl and extensions. Pt also reports increase back pain with LE stretches in seated position.    Rehab Potential Good   PT Frequency 2x / week   PT Duration 8 weeks   PT Treatment/Interventions ADLs/Self Care Home Management;Cryotherapy;Electrical Stimulation;Moist Heat;Therapeutic exercise;Therapeutic activities;Functional mobility training;Ultrasound;Traction;Neuromuscular re-education;Patient/family education;Manual techniques   PT Next Visit Plan Try to get pt moving.      Patient will benefit from skilled therapeutic intervention in order to improve the following deficits and impairments:  Decreased mobility, Decreased range of motion, Difficulty walking,  Decreased strength, Impaired flexibility, Increased muscle spasms, Pain, Improper body mechanics  Visit Diagnosis: Bilateral low back pain without sciatica  Cramp and spasm  Difficulty in walking, not elsewhere classified     Problem List Patient Active Problem List   Diagnosis Date Noted  . 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 Chronic lower back pain 06/18/2015    Scot Jun, PTA  11/11/2015, 11:40 AM  [place], [place], [address] Phone: [phone]   Fax:  [phone]  Name: Sarah Duncan MRN: [id number] Date of Birth: July 16, 1965

## 2015-11-17 NOTE — Progress Notes (Signed)
History and physical examinations reviewed in detail with PA Jacqulynn Cadet.  EKG reviewed during visit. Agree with assessment and plan. Redford Behrle Elayne Guerin, M.D. [facility] [address] [place], [place]  [address] [phone] phone [phone] fax

## 2015-11-18 ENCOUNTER — Encounter: Payer: Self-pay | Admitting: Physical Therapy

## 2015-11-18 ENCOUNTER — Ambulatory Visit: Payer: 59 | Admitting: Physical Therapy

## 2015-11-18 ENCOUNTER — Telehealth: Payer: Self-pay

## 2015-11-18 DIAGNOSIS — R262 Difficulty in walking, not elsewhere classified: Secondary | ICD-10-CM

## 2015-11-18 DIAGNOSIS — R252 Cramp and spasm: Secondary | ICD-10-CM

## 2015-11-18 DIAGNOSIS — M545 Low back pain, unspecified: Secondary | ICD-10-CM

## 2015-11-18 NOTE — Telephone Encounter (Signed)
Patient needs forms completed based off her last CPE I will place them in Sarah Duncan's box on 11/18/15 if you could please return them to the FMLA/Disability box at the 102 checkout desk within 5-7 business days. Thank you!

## 2015-11-18 NOTE — Therapy (Signed)
[place], [place], [address] Phone: [phone]   Fax:  [phone]  Physical Therapy Treatment  Patient Details  Name: Sarah Duncan MRN: [id number] Date of Birth: 1965-09-17 Referring Provider: C. Jacqulynn Cadet  Encounter Date: 11/18/2015      PT End of Session - 11/18/15 1039    Visit Number 3   Date for PT Re-Evaluation 12/28/15   PT Start Time [id number]   PT Stop Time 1055   PT Time Calculation (min) 40 min   Activity Tolerance Patient limited by pain   Behavior During Therapy [facility] for tasks assessed/performed      Past Medical History  Diagnosis Date  . History of kidney removal   . 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 Fibroids     Past Surgical History  Procedure Laterality Date  . Nephrectomy living donor Left     donated to her cousin  . Cesarean section  11/19/1989  . Tubal ligation  05/09/1993  . Abdominal hysterectomy  2007  . Spine surgery  07/13/2015    There were no vitals filed for this visit.      Subjective Assessment - 11/18/15 1015    Subjective "I think I over did it a few days ago, I [place]" Pt reports that she has been in increase pain since   Currently in Pain? Yes   Pain Score 9    Pain Location Back                         [facility] Adult PT Treatment/Exercise - 11/18/15 0001    Lumbar Exercises: Seated   Long Arc Quad on Chair 1 set;Both;10 reps   Knee/Hip Exercises: Aerobic   Nustep L3 x6 minutes   Knee/Hip Exercises: Seated   Ball Squeeze x20   Other Seated Knee/Hip Exercises Seated march x10  Lside caused more pain   Hamstring Curl Both;1 set;10 reps   Hamstring Limitations red Tband    Modalities   Modalities Moist Heat;Electrical Stimulation   Moist Heat Therapy   Number Minutes Moist Heat 15 Minutes   Moist Heat Location Lumbar Spine   Electrical Stimulation   Electrical Stimulation Location Lumbar area   Electrical Stimulation Action IFC   Electrical Stimulation Parameters sitting   Electrical Stimulation Goals Pain                  PT Short Term Goals - 10/28/15 1133    PT SHORT TERM GOAL #1   Title independent with initial HEP   Time 2   Period Weeks   Status New           PT Long Term Goals - 10/28/15 1133    PT LONG TERM GOAL #1   Title understand posture and body mechanics   Time 8   Period Weeks   Status New   PT LONG TERM GOAL #2   Title decrease pain 50%   Time 8   Period Weeks   Status New   PT LONG TERM GOAL #3   Title increase lumbar ROM 50%   Time 8   Period Weeks   Status New   PT LONG TERM GOAL #4   Title tolerate grocery shopping without increase of pain   Time 8   Period Weeks   Status New   PT  LONG TERM GOAL #5   Title increase LE strength to 4/5   Time 8   Period Weeks   Status New               Plan - 11/18/15 1040    Clinical Impression Statement Pt again limited due to increase pain. Pt reports 9/10 pain pre treatment, able to complete limited amount of seated exercises. More pain with seated HS curls. Pt is unable to lay down and reports sleeping sitting up.   Rehab Potential Good   PT Frequency 2x / week   PT Duration 8 weeks   PT Treatment/Interventions ADLs/Self Care Home Management;Cryotherapy;Electrical Stimulation;Moist Heat;Therapeutic exercise;Therapeutic activities;Functional mobility training;Ultrasound;Traction;Neuromuscular re-education;Patient/family education;Manual techniques   PT Next Visit Plan Try to get pt moving.      Patient will benefit from skilled therapeutic intervention in order to improve the following deficits and impairments:  Decreased mobility, Decreased range of motion, Difficulty walking, Decreased strength, Impaired flexibility, Increased muscle spasms, Pain, Improper body mechanics  Visit Diagnosis: Bilateral low back pain without  sciatica  Cramp and spasm  Difficulty in walking, not elsewhere classified     Problem List Patient Active Problem List   Diagnosis Date Noted  . 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 Chronic lower back pain 06/18/2015    Scot Jun, PTA 11/18/2015, 10:48 AM  [place] [address] [place], [place], [address] Phone: [phone]   Fax:  [phone]  Name: Sarah Duncan MRN: [id number] Date of Birth: 08-Jun-1965

## 2015-11-20 NOTE — Telephone Encounter (Signed)
I have received the form and reviewed my notes from her CPE.  The forms are much more specific than FMLA forms. I need to review the questions with her, one by one.  I will plan to contact her on Monday 11/22/15.

## 2015-11-23 ENCOUNTER — Encounter: Payer: Self-pay | Admitting: Physician Assistant

## 2015-11-23 DIAGNOSIS — F329 Major depressive disorder, single episode, unspecified: Secondary | ICD-10-CM | POA: Insufficient documentation

## 2015-11-23 DIAGNOSIS — F32A Depression, unspecified: Secondary | ICD-10-CM | POA: Insufficient documentation

## 2015-11-25 ENCOUNTER — Encounter: Payer: 59 | Admitting: *Deleted

## 2015-11-25 ENCOUNTER — Telehealth: Payer: Self-pay | Admitting: *Deleted

## 2015-11-25 ENCOUNTER — Encounter: Payer: Self-pay | Admitting: Physician Assistant

## 2015-11-25 ENCOUNTER — Ambulatory Visit (INDEPENDENT_AMBULATORY_CARE_PROVIDER_SITE_OTHER): Payer: 59

## 2015-11-25 ENCOUNTER — Ambulatory Visit (INDEPENDENT_AMBULATORY_CARE_PROVIDER_SITE_OTHER): Payer: 59 | Admitting: Physician Assistant

## 2015-11-25 VITALS — BP 134/83 | HR 85 | Temp 98.6°F | Resp 16 | Ht 67.5 in | Wt 206.0 lb

## 2015-11-25 DIAGNOSIS — K5909 Other constipation: Secondary | ICD-10-CM

## 2015-11-25 DIAGNOSIS — M545 Low back pain, unspecified: Secondary | ICD-10-CM

## 2015-11-25 DIAGNOSIS — G8929 Other chronic pain: Secondary | ICD-10-CM

## 2015-11-25 DIAGNOSIS — M47816 Spondylosis without myelopathy or radiculopathy, lumbar region: Secondary | ICD-10-CM | POA: Diagnosis not present

## 2015-11-25 MED ORDER — CYCLOBENZAPRINE HCL ER 15 MG PO CP24: 15.0000 mg | ORAL_CAPSULE | Freq: Every day | ORAL | Status: DC | PRN

## 2015-11-25 MED ORDER — PREGABALIN 150 MG PO CAPS: 150.0000 mg | ORAL_CAPSULE | Freq: Two times a day (BID) | ORAL | Status: DC

## 2015-11-25 MED ORDER — HYDROCODONE-ACETAMINOPHEN 5-325 MG PO TABS: 1.0000 | ORAL_TABLET | Freq: Four times a day (QID) | ORAL | Status: DC | PRN

## 2015-11-25 NOTE — Telephone Encounter (Signed)
Faxed document to [employer] ATTN: Wynona Luna, per patient, the original was given to patient. Confirmation page received at 6:38 pm.

## 2015-11-25 NOTE — Patient Instructions (Addendum)
Let me know how these changes are working. Both the Lyrica and the Amrix doses can be increased if needed. Continue the Miralax.    IF you received an x-ray today, you will receive an invoice from [facility]. Please contact [facility] at [phone] with questions or concerns regarding your invoice.   IF you received labwork today, you will receive an invoice from [employer]. Please contact [facility] at [phone] with questions or concerns regarding your invoice.   Our billing staff will not be able to assist you with questions regarding bills from these companies.  You will be contacted with the lab results as soon as they are available. The fastest way to get your results is to activate your My Chart account. Instructions are located on the last page of this paperwork. If you have not heard from [place] regarding the results in 2 weeks, please contact this office.

## 2015-11-25 NOTE — Progress Notes (Signed)
Patient ID: Sarah Duncan, female    DOB: 14-Feb-1966, 50 y.o.   MRN: [id number]  PCP: Harrison Mons, PA-C  Subjective:   Chief Complaint  Patient presents with  . Back Pain  . form to fill out    HPI Presents for evaluation of progressively worsening LBP, and to have some forms completed. She is accompanied by her wife, Ronni Rumble.  She requests resumption of the percocet she took previously. At her last visit with me, I recommended that she increase the gabapentin by 300 mg daily, up to a total of 900 mg TID as tolerated. Increased the gabapentin up to 900 mg TID, and has experienced constipation, but no improvement in the pain. Recall she had stopped the percocet, not wanting to become addicted, and started the gabapentin, following her surgery in 06/2014. 3 sessions of PT. The PT has advised her that he doesn't think he has more to offer, as she cannot tolerate the stretching, but that is not documented in the notes I have access to in the EMR. Feels like her pain is just getting worse.  No recurrent radiculopathy. No loss of bowel/bladder control. No saddle anesthesia.  Isn't to take NSAIDS due to having only one kidney (she donated a kidney to a cousin in 2009). Flexeril caused her to "feel like a zombie" but she doesn't recall if it helped the pain. OTC Aspercreme patched helped some before her original pain was so bad that she sought evaluation, which ultimately revealed a herniated disc and required surgical intervention.     Review of Systems  Constitutional: Negative.  Negative for activity change, appetite change, fatigue and unexpected weight change.  HENT: Negative for congestion, dental problem, ear pain, hearing loss, mouth sores, postnasal drip, rhinorrhea, sneezing, sore throat, tinnitus and trouble swallowing.   Eyes: Negative for photophobia, pain, redness and visual disturbance.  Respiratory: Negative for cough, chest tightness and shortness  of breath.   Cardiovascular: Negative for chest pain, palpitations and leg swelling.  Gastrointestinal: Positive for constipation. Negative for nausea, vomiting, abdominal pain, diarrhea and blood in stool.  Genitourinary: Negative for dysuria, urgency, frequency and hematuria.  Musculoskeletal: Positive for back pain. Negative for myalgias, arthralgias, gait problem and neck stiffness.  Skin: Negative for rash.  Neurological: Negative for dizziness, speech difficulty, weakness, light-headedness, numbness and headaches.  Hematological: Negative for adenopathy.  Psychiatric/Behavioral: Negative for confusion and sleep disturbance. The patient is not nervous/anxious.        Patient Active Problem List   Diagnosis Date Noted  . BMI 31.0-31.9,adult 11/25/2015  . Depression 11/23/2015  . 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 Chronic lower back pain 06/18/2015     Prior to Admission medications   Medication Sig Start Date End Date Taking? Authorizing Provider  Acetaminophen (TYLENOL ARTHRITIS PAIN PO) Take 650 mg by mouth daily.   Yes Historical Provider, MD  acetaminophen (TYLENOL) 650 MG CR tablet Take 650 mg by mouth every 8 (eight) hours as needed for pain.   Yes Historical Provider, MD  BIOTIN PO Take by mouth daily.   Yes Historical Provider, MD  Gabapentin (NEURONTIN PO) Take by mouth 3 (three) times daily.   Yes Historical Provider, MD  MELATONIN PO Take by mouth daily.   Yes Historical Provider, MD  Multiple Vitamin (MULTIVITAMIN) tablet Take 1 tablet by mouth daily.   Yes Historical Provider, MD  HYDROcodone-acetaminophen (NORCO/VICODIN) 5-325 MG tablet Take 1 tablet by mouth every 6 (six) hours as needed for moderate pain. Reported  on 11/25/2015    Historical Provider, MD     No Known Allergies     Objective:  Physical Exam  Constitutional: She is oriented to person, place, and time. She appears well-developed and well-nourished. She is active and cooperative. No distress.  BP  134/83 mmHg  Pulse 85  Temp(Src) 98.6 F (37 C) (Oral)  Resp 16  Ht 5' 7.5" (1.715 m)  Wt 206 lb (93.441 kg)  BMI 31.77 kg/m2  SpO2 99%   Eyes: Conjunctivae are normal.  Pulmonary/Chest: Effort normal.  Musculoskeletal:       Cervical back: Normal.       Thoracic back: She exhibits decreased range of motion, tenderness, bony tenderness and pain. She exhibits no swelling, no edema, no deformity, no laceration, no spasm and normal pulse.       Lumbar back: She exhibits decreased range of motion, tenderness, bony tenderness and pain. She exhibits no swelling, no edema, no deformity, no laceration, no spasm and normal pulse.  Neurological: She is alert and oriented to person, place, and time. She displays no atrophy and no tremor. No cranial nerve deficit or sensory deficit. She exhibits normal muscle tone. She displays no seizure activity.  Reflex Scores:      Patellar reflexes are 1+ on the right side and 1+ on the left side.      Achilles reflexes are 1+ on the right side and 1+ on the left side. Reduced strength in both lower extremities due to pain in the low back (not radiating) on testing. SLR causes pain, again, not radicular, bilaterally. No ankle clonus.  Psychiatric: She has a normal mood and affect. Her speech is normal and behavior is normal.       Dg Lumbar Spine Complete  11/25/2015  CLINICAL DATA:  Chronic low back pain.  Status post lumbar surgery. EXAM: LUMBAR SPINE - COMPLETE 4+ VIEW COMPARISON:  None. FINDINGS: Minimal convex right lumbar spine curvature. Five lumbar type vertebral bodies. Sacroiliac joints are symmetric. Colonic stool burden suggests constipation. Maintenance of vertebral body height and alignment. Loss of intervertebral disc height at L4-5. Facet arthropathy at this level and at the lumbosacral junction. IMPRESSION: Spondylosis, without acute osseous abnormality. Possible constipation. Electronically Signed   By: Abigail Miyamoto M.D.   On: 11/25/2015 18:13         Assessment & Plan:   1. Chronic lower back pain No benefit with increased gabapentin, and constipation, which is likely to exacerbate her pain. Stop gabapentin. Start pregabalin. Trial of Amrix, to see if it helps without causing too much drowsiness. WOuld consider lidocaine patches next, and referral to orthopedics. - DG Lumbar Spine Complete; Future - HYDROcodone-acetaminophen (NORCO/VICODIN) 5-325 MG tablet; Take 1 tablet by mouth every 6 (six) hours as needed for moderate pain. Reported on 11/25/2015  Dispense: 60 tablet; Refill: 0 - pregabalin (LYRICA) 150 MG capsule; Take 1 capsule (150 mg total) by mouth 2 (two) times daily.  Dispense: 60 capsule; Refill: 0 - cyclobenzaprine (AMRIX) 15 MG 24 hr capsule; Take 1 capsule (15 mg total) by mouth daily as needed for muscle spasms.  Dispense: 30 capsule; Refill: 0  2. Other constipation Stop gabapentin. Minimize use of hydrocodone. Continue Miralax.   Fara Chute, PA-C Physician Assistant-Certified [facility]

## 2015-11-25 NOTE — Telephone Encounter (Signed)
Called patient and left a message on 11/25/15, to let her know that she will need to come in for an OV to get the forms completed I will place the forms in the pick up draw at 102 for whenever she comes in to be seen. There is also a blank copy scanned into the system under media file listed as "Signed request for records"

## 2015-11-25 NOTE — Patient Instructions (Signed)
     IF you received an x-ray today, you will receive an invoice from [facility]. Please contact [facility] at [phone] with questions or concerns regarding your invoice.   IF you received labwork today, you will receive an invoice from [facility]. Please contact [facility] at [phone] with questions or concerns regarding your invoice.   Our billing staff will not be able to assist you with questions regarding bills from these companies.  You will be contacted with the lab results as soon as they are available. The fastest way to get your results is to activate your My Chart account. Instructions are located on the last page of this paperwork. If you have not heard from us regarding the results in 2 weeks, please contact this office.      

## 2015-11-29 MED FILL — HYDROCODON-APAP 5-325: 5-325 | 15 days supply | Qty: 60 | Fill #0

## 2015-11-29 MED FILL — LYRICA 150 MG CAPSULE: 150 | 30 days supply | Qty: 60 | Fill #0

## 2015-11-30 ENCOUNTER — Ambulatory Visit: Payer: 59 | Attending: Family Medicine | Admitting: Physical Therapy

## 2015-11-30 ENCOUNTER — Encounter: Payer: Self-pay | Admitting: Physical Therapy

## 2015-11-30 DIAGNOSIS — R252 Cramp and spasm: Secondary | ICD-10-CM | POA: Diagnosis not present

## 2015-11-30 DIAGNOSIS — R262 Difficulty in walking, not elsewhere classified: Secondary | ICD-10-CM | POA: Insufficient documentation

## 2015-11-30 DIAGNOSIS — M545 Low back pain, unspecified: Secondary | ICD-10-CM

## 2015-11-30 MED FILL — AMRIX 15 MG CAPSULE ER: 15 | 30 days supply | Qty: 30 | Fill #0

## 2015-11-30 NOTE — Therapy (Signed)
[place], [place], [address] Phone: [phone]   Fax:  [phone]  Physical Therapy Treatment  Patient Details  Name: Sarah Duncan MRN: [id number] Date of Birth: 03-26-1966 Referring Provider: C. Jacqulynn Cadet  Encounter Date: 11/30/2015      PT End of Session - 11/30/15 1504    Visit Number 4   Date for PT Re-Evaluation 12/28/15   PT Start Time [id number]   PT Stop Time 1522   PT Time Calculation (min) 54 min   Activity Tolerance Patient limited by pain   Behavior During Therapy [facility] for tasks assessed/performed      Past Medical History  Diagnosis Date  . 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 Fibroids   . Anemia     Past Surgical History  Procedure Laterality Date  . Nephrectomy living donor Left     donated to her cousin  . Cesarean section  11/19/1989  . Tubal ligation  05/09/1993  . Abdominal hysterectomy  2007  . Spine surgery  07/13/2015    Dr. Christia Reading    There were no vitals filed for this visit.      Subjective Assessment - 11/30/15 1429    Subjective Pt reports that she is doing a little bit better than she was last time   Currently in Pain? Yes   Pain Score 6   Took pain pills 30 minutes ago                         [facility] Adult PT Treatment/Exercise - 11/30/15 0001    Lumbar Exercises: Machines for Strengthening   Other Lumbar Machine Exercise Seated rows & lats 10lb 2x10   Knee/Hip Exercises: Aerobic   Nustep L3 x6 minutes   Knee/Hip Exercises: Seated   Long Arc Quad 2 sets;Both;10 reps   [facility] x20   Other Seated Knee/Hip Exercises Seated march 2x10   Hamstring Curl Both;1 set;10 reps   Hamstring Limitations red Tband    Abduction/Adduction  15 reps;Strengthening;2 sets;Both   Modalities   Modalities Moist Heat;Electrical Stimulation   Moist Heat Therapy   Number Minutes Moist Heat 15 Minutes   Moist Heat  Location Lumbar Spine   Electrical Stimulation   Electrical Stimulation Location Lumbar area   Electrical Stimulation Action IFC   Electrical Stimulation Parameters Sitting   Electrical Stimulation Goals Pain   Manual Therapy   Manual Therapy Scapular mobilization                  PT Short Term Goals - 10/28/15 1133    PT SHORT TERM GOAL #1   Title independent with initial HEP   Time 2   Period Weeks   Status New           PT Long Term Goals - 10/28/15 1133    PT LONG TERM GOAL #1   Title understand posture and body mechanics   Time 8   Period Weeks   Status New   PT LONG TERM GOAL #2   Title decrease pain 50%   Time 8   Period Weeks   Status New   PT LONG TERM GOAL #3   Title increase lumbar ROM 50%   Time 8   Period Weeks   Status New   PT LONG TERM GOAL #4   Title tolerate grocery shopping without increase of pain  Time 8   Period Weeks   Status New   PT LONG TERM GOAL #5   Title increase LE strength to 4/5   Time 8   Period Weeks   Status New               Plan - 11/30/15 1505    Clinical Impression Statement Pt continues to be limited by pain. Pt with a slightly better tolerance to LE exercises compared to previous treatments. Previously pt unable to tolerate any activity or passive stretching due to low back pain. Today pt able to complete more interventions  in the seated position but still reports pain.  Pt attempted to get into the supine position for passive LE stretching. Pt unable to tolerate supine position for LE stretching.   Rehab Potential Good   PT Frequency 2x / week   PT Duration 8 weeks   PT Treatment/Interventions ADLs/Self Care Home Management;Cryotherapy;Electrical Stimulation;Moist Heat;Therapeutic exercise;Therapeutic activities;Functional mobility training;Ultrasound;Traction;Neuromuscular re-education;Patient/family education;Manual techniques   PT Next Visit Plan assess Tx, progress as tolerated.      Patient  will benefit from skilled therapeutic intervention in order to improve the following deficits and impairments:  Decreased mobility, Decreased range of motion, Difficulty walking, Decreased strength, Impaired flexibility, Increased muscle spasms, Pain, Improper body mechanics  Visit Diagnosis: Bilateral low back pain without sciatica  Cramp and spasm  Difficulty in walking, not elsewhere classified     Problem List Patient Active Problem List   Diagnosis Date Noted  . BMI 31.0-31.9,adult 11/25/2015  . Depression 11/23/2015  . 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 Chronic lower back pain 06/18/2015    Scot Jun, PTA   11/30/2015, 3:11 PM  [place], [place], [address] Phone: [phone]   Fax:  [phone]  Name: Sarah Duncan MRN: [id number] Date of Birth: [birth year]

## 2015-12-07 ENCOUNTER — Ambulatory Visit: Payer: 59 | Admitting: Physical Therapy

## 2015-12-07 ENCOUNTER — Encounter: Payer: Self-pay | Admitting: Physical Therapy

## 2015-12-07 DIAGNOSIS — R262 Difficulty in walking, not elsewhere classified: Secondary | ICD-10-CM

## 2015-12-07 DIAGNOSIS — M545 Low back pain, unspecified: Secondary | ICD-10-CM

## 2015-12-07 DIAGNOSIS — R252 Cramp and spasm: Secondary | ICD-10-CM | POA: Diagnosis not present

## 2015-12-07 NOTE — Therapy (Signed)
[place], [place], [address] Phone: [phone]   Fax:  [phone]  Physical Therapy Treatment  Patient Details  Name: ATIYAH HIGGENS MRN: [id number] Date of Birth: [birth year] Referring Provider: C. Jacqulynn Cadet  Encounter Date: 12/07/2015      PT End of Session - 12/07/15 1015    Visit Number 5   Date for PT Re-Evaluation 12/28/15   PT Start Time 0932   PT Stop Time 1030   PT Time Calculation (min) 58 min   Activity Tolerance Patient tolerated treatment well   Behavior During Therapy [facility] for tasks assessed/performed      Past Medical History  Diagnosis Date  . 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 Fibroids   . Anemia     Past Surgical History  Procedure Laterality Date  . Nephrectomy living donor Left     donated to her cousin  . Cesarean section  11/19/1989  . Tubal ligation  05/09/1993  . Abdominal hysterectomy  2007  . Spine surgery  07/13/2015    Dr. Christia Reading    There were no vitals filed for this visit.      Subjective Assessment - 12/07/15 0934    Subjective Pt reports that she was all right after last treatment. Pt reports that she did not take her medicine this morning, but her med's does help when she takes it    Currently in Pain? Yes   Pain Score 6    Pain Location Back   Pain Orientation Lower                         [facility] Adult PT Treatment/Exercise - 12/07/15 0001    Lumbar Exercises: Stretches   Passive Hamstring Stretch 10 seconds;4 reps  seated position    Lumbar Exercises: Standing   Row 15 reps   Theraband Level (Row) Level 2 (Red)   Shoulder Extension 15 reps;Theraband   Theraband Level (Shoulder Extension) Level 2 (Red)   Shoulder ADduction 15 reps   Shoulder Adduction Limitations 2   Knee/Hip Exercises: Aerobic   Nustep L3 x6 minutes   Knee/Hip Exercises: Seated   Long Arc Quad 2 sets;Both;15 reps   Ball Squeeze 2x15   Hamstring Curl Both;15 reps;2 sets   Hamstring Limitations yellow Tband   Abduction/Adduction  15 reps;Strengthening;2 sets;Both   Abd/Adduction Limitations green Tband    Sit to Sand 5 reps;with UE support  from lowered UBE seat.    Modalities   Modalities Moist Heat;Electrical Stimulation   Moist Heat Therapy   Number Minutes Moist Heat 15 Minutes   Moist Heat Location Lumbar Spine   Electrical Stimulation   Electrical Stimulation Location Lumbar area   Electrical Stimulation Action IFC   Electrical Stimulation Parameters sitting   Electrical Stimulation Goals Pain                  PT Short Term Goals - 10/28/15 1133    PT SHORT TERM GOAL #1   Title independent with initial HEP   Time 2   Period Weeks   Status New           PT Long Term Goals - 10/28/15 1133    PT LONG TERM GOAL #1   Title understand posture and body mechanics   Time 8   Period Weeks   Status New   PT LONG TERM GOAL #  2   Title decrease pain 50%   Time 8   Period Weeks   Status New   PT LONG TERM GOAL #3   Title increase lumbar ROM 50%   Time 8   Period Weeks   Status New   PT LONG TERM GOAL #4   Title tolerate grocery shopping without increase of pain   Time 8   Period Weeks   Status New   PT LONG TERM GOAL #5   Title increase LE strength to 4/5   Time 8   Period Weeks   Status New               Plan - 12/07/15 1015    Clinical Impression Statement Pt with a better tolerance of therapeutic exercises. Despite reports pain pt able to push through and complete all of today's interventions. Does have more pain with LE interventions R>L.    Rehab Potential Good   PT Frequency 2x / week   PT Duration 8 weeks   PT Treatment/Interventions ADLs/Self Care Home Management;Cryotherapy;Electrical Stimulation;Moist Heat;Therapeutic exercise;Therapeutic activities;Functional mobility training;Ultrasound;Traction;Neuromuscular re-education;Patient/family  education;Manual techniques   PT Next Visit Plan assess Tx, progress as tolerated.      Patient will benefit from skilled therapeutic intervention in order to improve the following deficits and impairments:  Decreased mobility, Decreased range of motion, Difficulty walking, Decreased strength, Impaired flexibility, Increased muscle spasms, Pain, Improper body mechanics  Visit Diagnosis: Cramp and spasm  Difficulty in walking, not elsewhere classified  Bilateral low back pain without sciatica     Problem List Patient Active Problem List   Diagnosis Date Noted  . BMI 31.0-31.9,adult 11/25/2015  . Depression 11/23/2015  . 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 Chronic lower back pain 06/18/2015    Scot Jun, PTA  12/07/2015, 10:18 AM  [place], [place], [address] Phone: [phone]   Fax:  [phone]  Name: PAITON KOESTLER MRN: [id number] Date of Birth: December 20, 1965

## 2015-12-14 ENCOUNTER — Encounter: Payer: Self-pay | Admitting: Physical Therapy

## 2015-12-14 ENCOUNTER — Ambulatory Visit: Payer: 59 | Admitting: Physical Therapy

## 2015-12-14 DIAGNOSIS — R262 Difficulty in walking, not elsewhere classified: Secondary | ICD-10-CM

## 2015-12-14 DIAGNOSIS — M545 Low back pain, unspecified: Secondary | ICD-10-CM

## 2015-12-14 DIAGNOSIS — R252 Cramp and spasm: Secondary | ICD-10-CM | POA: Diagnosis not present

## 2015-12-14 NOTE — Therapy (Signed)
[place], [place], [address] Phone: [phone]   Fax:  [phone]  Physical Therapy Treatment  Patient Details  Name: Sarah Duncan MRN: [id number] Date of Birth: April 24, 1966 Referring Provider: C. Jacqulynn Cadet  Encounter Date: 12/14/2015      PT End of Session - 12/14/15 1012    Visit Number 6   Date for PT Re-Evaluation 12/28/15   PT Start Time 0933   PT Stop Time 1028   PT Time Calculation (min) 55 min   Activity Tolerance Patient tolerated treatment well;Patient limited by pain   Behavior During Therapy [facility] for tasks assessed/performed      Past Medical History:  Diagnosis Date  . Anemia   . Fibroids   . 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 ABDOMINAL HYSTERECTOMY  2007  . CESAREAN SECTION  11/19/1989  . 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 SPINE SURGERY  07/13/2015   Dr. Christia Reading  . TUBAL LIGATION  05/09/1993    There were no vitals filed for this visit.      Subjective Assessment - 12/14/15 0937    Subjective "Saturday we went to [place] to the flea market, I was good for about an hour then I hd to stop"   Currently in Pain? Yes   Pain Score 6    Pain Location Back                         [facility] Adult PT Treatment/Exercise - 12/14/15 0001      Lumbar Exercises: Standing   Other Standing Lumbar Exercises Sit to stand x5  with UE use     Lumbar Exercises: Seated   Long Arc Quad on Chair Both;10 reps;2 sets   LAQ on Chair Weights (lbs) 3     Lumbar Exercises: Supine   Other Supine Lumbar Exercises Seeated rows blue Tband 2x15   Other Supine Lumbar Exercises seated back ext black tband x10     Knee/Hip Exercises: Aerobic   Nustep L3 x7 minutes     Knee/Hip Exercises: Seated   Hamstring Curl Both;15 reps;2 sets   Hamstring Limitations red     Modalities   Modalities Moist Heat;Electrical Stimulation     Moist Heat Therapy   Number Minutes Moist Heat 15 Minutes   Moist Heat Location Lumbar Spine     Electrical Stimulation   Electrical Stimulation Location Lumbar area   Electrical Stimulation Action IFC   Electrical Stimulation Parameters sitting   Electrical Stimulation Goals Pain                  PT Short Term Goals - 12/14/15 0941      PT SHORT TERM GOAL #1   Title independent with initial HEP   Status Achieved           PT Long Term Goals - 12/14/15 0940      PT LONG TERM GOAL #1   Title understand posture and body mechanics   Status Partially Met     PT LONG TERM GOAL #2   Title decrease pain 50%   Status On-going     PT LONG TERM GOAL #3   Title increase lumbar ROM 50%   Status On-going     PT LONG TERM GOAL #4   Title  tolerate grocery shopping without increase of pain   Status Partially Met     PT LONG TERM GOAL #5   Title increase LE strength to 4/5   Status On-going               Plan - 12/14/15 1013    Clinical Impression Statement Pt continues to be limited by pain. despite reporting pain throughout treatment pat able to complete all exercises. Pt does state that she was able to walk for an hour this weekend before having increase back pain. Throughout session Pt L extremities appears to be stronger and causes less pain compared to R.   Rehab Potential Good   PT Frequency 2x / week   PT Duration 8 weeks   PT Treatment/Interventions ADLs/Self Care Home Management;Cryotherapy;Electrical Stimulation;Moist Heat;Therapeutic exercise;Therapeutic activities;Functional mobility training;Ultrasound;Traction;Neuromuscular re-education;Patient/family education;Manual techniques   PT Next Visit Plan assess Tx, progress as tolerated.      Patient will benefit from skilled therapeutic intervention in order to improve the following deficits and impairments:  Decreased mobility,  Decreased range of motion, Difficulty walking, Decreased strength, Impaired flexibility, Increased muscle spasms, Pain, Improper body mechanics  Visit Diagnosis: Cramp and spasm  Difficulty in walking, not elsewhere classified  Bilateral low back pain without sciatica     Problem List Patient Active Problem List   Diagnosis Date Noted  . BMI 31.0-31.9,adult 11/25/2015  . Depression 11/23/2015  . 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 Chronic lower back pain 06/18/2015    Scot Jun, PTA  12/14/2015, 10:16 AM  [place], [place], [address] Phone: [phone]   Fax:  [phone]  Name: Sarah Duncan MRN: [id number] Date of Birth: Dec 30, 1965

## 2015-12-21 ENCOUNTER — Ambulatory Visit: Payer: 59 | Admitting: Physical Therapy

## 2015-12-29 ENCOUNTER — Ambulatory Visit: Payer: 59 | Attending: Physician Assistant | Admitting: Physical Therapy

## 2015-12-29 ENCOUNTER — Encounter: Payer: Self-pay | Admitting: Physical Therapy

## 2015-12-29 DIAGNOSIS — R252 Cramp and spasm: Secondary | ICD-10-CM | POA: Insufficient documentation

## 2015-12-29 DIAGNOSIS — M545 Low back pain, unspecified: Secondary | ICD-10-CM

## 2015-12-29 DIAGNOSIS — R262 Difficulty in walking, not elsewhere classified: Secondary | ICD-10-CM | POA: Diagnosis not present

## 2015-12-29 NOTE — Therapy (Signed)
[place], [place], [address] Phone: [phone]   Fax:  [phone]  Physical Therapy Treatment  Patient Details  Name: Sarah Duncan MRN: [id number] Date of Birth: 11/24/1965 Referring Provider: C. Jacqulynn Cadet  Encounter Date: 12/29/2015      PT End of Session - 12/29/15 1441    Visit Number 7   Date for PT Re-Evaluation 12/28/15   PT Start Time 1347   PT Stop Time 1453   PT Time Calculation (min) 66 min   Activity Tolerance Patient tolerated treatment well;Patient limited by pain   Behavior During Therapy [facility] for tasks assessed/performed      Past Medical History:  Diagnosis Date  . Anemia   . Fibroids   . 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 ABDOMINAL HYSTERECTOMY  2007  . CESAREAN SECTION  11/19/1989  . 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 SPINE SURGERY  07/13/2015   Dr. Christia Reading  . TUBAL LIGATION  05/09/1993    There were no vitals filed for this visit.      Subjective Assessment - 12/29/15 1347    Subjective "Since the last time I was here we did a lot of bottom stuff, that killed me"   Currently in Pain? Yes   Pain Score 6   On med's right now   Pain Location Back            [facility] PT Assessment - 12/29/15 0001      Posture/Postural Control   Posture Comments increased lordosis     ROM / Strength   AROM / PROM / Strength AROM;Strength     AROM   Overall AROM Comments Lumbar ROM was decreased >75% with low back pain, decreased 25% with extension      Strength   Overall Strength Comments 3+/5 bilat knee flexors' 4/5 bilat knee extensors, Hip abd/add 3+/5, hip flexors 4/5     Palpation   Palpation comment very tender in the lumbar paraspinals and into the buttocks                     [facility] Adult PT Treatment/Exercise - 12/29/15 0001      Lumbar Exercises: Seated   Long Arc Quad on Chair Both;10 reps;2 sets   LAQ on Chair Weights (lbs) 0   Sit to Stand 5 reps  x2 with UE support     Lumbar Exercises: Supine   Other Supine Lumbar Exercises Seated rows & Lats 20lb 2x10   Other Supine Lumbar Exercises seated back ext black tband 2x10; Seated OHP with yellow ball     Knee/Hip Exercises: Stretches   Passive Hamstring Stretch 2 reps;10 seconds  in seated position.     Knee/Hip Exercises: Aerobic   Nustep L3 x7 minutes     Knee/Hip Exercises: Standing   Hip Abduction Both;1 set;10 reps   Hip Extension Both;1 set;10 reps     Modalities   Modalities Moist Heat;Electrical Stimulation     Moist Heat Therapy   Number Minutes Moist Heat 15 Minutes   Moist Heat Location Lumbar Spine     Electrical Stimulation   Electrical Stimulation Location Lumbar area   Electrical Stimulation Action IFC   Electrical Stimulation Parameters sitting to pt tolerance   Electrical Stimulation Goals Pain  PT Short Term Goals - 12/14/15 0941      PT SHORT TERM GOAL #1   Title independent with initial HEP   Status Achieved           PT Long Term Goals - 12/29/15 1400      PT LONG TERM GOAL #3   Title increase lumbar ROM 50%   Status On-going     PT LONG TERM GOAL #5   Title increase LE strength to 4/5   Status On-going               Plan - 12/29/15 1441    Clinical Impression Statement Pt again limited by pain, pt reports pain throughout treatment with all interventions. Pt lumbar ROM has increase with extension, but has remained the same with all other movements. LE strength has improve only with extension. Pt is limited positionally she is unable to tolerate supine position.  Pt stated that she has been in more pain and has to take more pain medicine since last treatment which consisted or more LE strengthening.    Rehab Potential Fair   PT Frequency 2x / week   PT Duration 8 weeks   PT  Treatment/Interventions ADLs/Self Care Home Management;Cryotherapy;Electrical Stimulation;Moist Heat;Therapeutic exercise;Therapeutic activities;Functional mobility training;Ultrasound;Traction;Neuromuscular re-education;Patient/family education;Manual techniques   PT Next Visit Plan assess Tx, progress as tolerated.      Patient will benefit from skilled therapeutic intervention in order to improve the following deficits and impairments:  Decreased mobility, Decreased range of motion, Difficulty walking, Decreased strength, Impaired flexibility, Increased muscle spasms, Pain, Improper body mechanics  Visit Diagnosis: Difficulty in walking, not elsewhere classified  Bilateral low back pain without sciatica  Cramp and spasm     Problem List Patient Active Problem List   Diagnosis Date Noted  . BMI 31.0-31.9,adult 11/25/2015  . Depression 11/23/2015  . 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 Chronic lower back pain 06/18/2015    Scot Jun 12/29/2015, 2:47 PM  [place], [place], [address] Phone: [phone]   Fax:  [phone]  Name: Sarah Duncan MRN: [id number] Date of Birth: [birth year]

## 2016-01-02 ENCOUNTER — Encounter: Payer: Self-pay | Admitting: Physician Assistant

## 2016-01-03 MED ORDER — LIDOCAINE 5 % EX PTCH: 1.0000 | MEDICATED_PATCH | CUTANEOUS | 0 refills | Status: DC

## 2016-01-03 NOTE — Telephone Encounter (Signed)
Patient notified via My Chart.  Meds ordered this encounter  Medications  . lidocaine (LIDODERM) 5 %    Sig: Place 1 patch onto the skin daily. Remove & Discard patch within 12 hours or as directed by MD    Dispense:  30 patch    Refill:  0    Order Specific Question:   Supervising Provider    Answer:   Brigitte Pulse, EVA N [[id number]]

## 2016-01-04 MED FILL — LIDOCAINE 5% PATCH: 5 | 30 days supply | Qty: 30 | Fill #0

## 2016-01-05 ENCOUNTER — Encounter: Payer: Self-pay | Admitting: Physical Therapy

## 2016-01-05 ENCOUNTER — Ambulatory Visit: Payer: 59 | Admitting: Physical Therapy

## 2016-01-05 DIAGNOSIS — M545 Low back pain, unspecified: Secondary | ICD-10-CM

## 2016-01-05 DIAGNOSIS — R252 Cramp and spasm: Secondary | ICD-10-CM | POA: Diagnosis not present

## 2016-01-05 DIAGNOSIS — R262 Difficulty in walking, not elsewhere classified: Secondary | ICD-10-CM

## 2016-01-05 NOTE — Therapy (Signed)
[place], [place], [address] Phone: [phone]   Fax:  [phone]  Physical Therapy Treatment  Patient Details  Name: PATRESE BRUTTO MRN: [id number] Date of Birth: [birth year] Referring Provider: C. Jacqulynn Cadet  Encounter Date: 01/05/2016      PT End of Session - 01/05/16 1010    Visit Number 8   Date for PT Re-Evaluation 01/28/16   PT Start Time 0930   PT Stop Time 1025   PT Time Calculation (min) 55 min   Activity Tolerance Patient tolerated treatment well;Patient limited by pain   Behavior During Therapy [facility] for tasks assessed/performed      Past Medical History:  Diagnosis Date  . Anemia   . Fibroids   . 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 ABDOMINAL HYSTERECTOMY  2007  . CESAREAN SECTION  11/19/1989  . 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 SPINE SURGERY  07/13/2015   Dr. Christia Reading  . TUBAL LIGATION  05/09/1993    There were no vitals filed for this visit.      Subjective Assessment - 01/05/16 0929    Subjective "Im ok, Im always in pain"   Currently in Pain? Yes   Pain Score 6    Pain Location Back   Pain Orientation Lower                         [facility] Adult PT Treatment/Exercise - 01/05/16 0001      Lumbar Exercises: Seated   Long Arc Quad on Chair Both;2 sets;15 reps   LAQ on Chair Weights (lbs) 3     Lumbar Exercises: Supine   Other Supine Lumbar Exercises Seated OHP blue ball 2x10; Seated lumbar extensions black Tband 3x10   Other Supine Lumbar Exercises Seated overhead throws with yellow ball 2x10     Knee/Hip Exercises: Aerobic   Nustep L3 x7 minutes     Knee/Hip Exercises: Standing   Hip Abduction Both;10 reps;2 sets   Hip Extension Both;10 reps;2 sets   Other Standing Knee Exercises Standing march 2x10     Modalities   Modalities  Moist Heat;Electrical Stimulation     Moist Heat Therapy   Number Minutes Moist Heat 15 Minutes   Moist Heat Location Lumbar Spine     Electrical Stimulation   Electrical Stimulation Location Lumbar area   Electrical Stimulation Action IFC   Electrical Stimulation Parameters sitting to pt tolerance   Electrical Stimulation Goals Pain                  PT Short Term Goals - 12/14/15 0941      PT SHORT TERM GOAL #1   Title independent with initial HEP   Status Achieved           PT Long Term Goals - 12/29/15 1400      PT LONG TERM GOAL #3   Title increase lumbar ROM 50%   Status On-going     PT LONG TERM GOAL #5   Title increase LE strength to 4/5   Status On-going               Plan - 01/05/16 1010    Clinical Impression Statement Continues to be limited by pain. Despite, pt with abetter tolerance to activity. She completed all interventions  well but does report pain throughout. Pt reports no change since starting therapy   Rehab Potential Fair   PT Frequency 2x / week   PT Duration 8 weeks   PT Treatment/Interventions ADLs/Self Care Home Management;Cryotherapy;Electrical Stimulation;Moist Heat;Therapeutic exercise;Therapeutic activities;Functional mobility training;Ultrasound;Traction;Neuromuscular re-education;Patient/family education;Manual techniques   PT Next Visit Plan assess Tx, HS stretching in seated position      Patient will benefit from skilled therapeutic intervention in order to improve the following deficits and impairments:  Decreased mobility, Decreased range of motion, Difficulty walking, Decreased strength, Impaired flexibility, Increased muscle spasms, Pain, Improper body mechanics  Visit Diagnosis: Difficulty in walking, not elsewhere classified  Bilateral low back pain without sciatica  Cramp and spasm     Problem List Patient Active Problem List   Diagnosis Date Noted  . BMI 31.0-31.9,adult 11/25/2015  . Depression  11/23/2015  . 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 Chronic lower back pain 06/18/2015    Scot Jun, PTA  01/05/2016, 10:12 AM  [place], [place], [address] Phone: [phone]   Fax:  [phone]  Name: JAHMIA JOSE MRN: [id number] Date of Birth: Apr 20, 1966

## 2016-01-19 ENCOUNTER — Encounter: Payer: Self-pay | Admitting: Physical Therapy

## 2016-01-19 ENCOUNTER — Ambulatory Visit: Payer: 59 | Admitting: Physical Therapy

## 2016-01-19 DIAGNOSIS — M545 Low back pain, unspecified: Secondary | ICD-10-CM

## 2016-01-19 DIAGNOSIS — R252 Cramp and spasm: Secondary | ICD-10-CM | POA: Diagnosis not present

## 2016-01-19 DIAGNOSIS — R262 Difficulty in walking, not elsewhere classified: Secondary | ICD-10-CM

## 2016-01-19 NOTE — Therapy (Signed)
[place], [place], [address] Phone: [phone]   Fax:  [phone]  Physical Therapy Treatment  Patient Details  Name: Sarah Duncan MRN: [id number] Date of Birth: [birth year] Referring Provider: C. Jacqulynn Cadet  Encounter Date: 01/19/2016      PT End of Session - 01/19/16 1016    Visit Number 9   Date for PT Re-Evaluation 01/28/16   PT Start Time 0930   PT Stop Time 1030   PT Time Calculation (min) 60 min   Activity Tolerance Patient tolerated treatment well   Behavior During Therapy [facility] for tasks assessed/performed      Past Medical History:  Diagnosis Date  . Anemia   . Fibroids   . 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 ABDOMINAL HYSTERECTOMY  2007  . CESAREAN SECTION  11/19/1989  . 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 SPINE SURGERY  07/13/2015   Dr. Christia Reading  . TUBAL LIGATION  05/09/1993    There were no vitals filed for this visit.      Subjective Assessment - 01/19/16 0939    Subjective "I can walk a little more, but still cant stand for a long period of time"   Currently in Pain? Yes   Pain Score 6   "Im medicated"   Pain Location Back                         [facility] Adult PT Treatment/Exercise - 01/19/16 0001      Lumbar Exercises: Aerobic   UBE (Upper Arm Bike) L2 x4 min      Lumbar Exercises: Machines for Strengthening   Other Lumbar Machine Exercise Seated rows & lats 20lb 2x10     Lumbar Exercises: Seated   Long Arc Quad on Chair Both;2 sets;15 reps   LAQ on Chair Weights (lbs) 3   Sit to Stand 10 reps  x2, elevated UBE seat      Lumbar Exercises: Supine   Other Supine Lumbar Exercises black Tband x10   Other Supine Lumbar Exercises Seated overhead throws with yellow ball 2x10     Knee/Hip Exercises: Aerobic   Nustep L4 x7 minutes     Modalities   Modalities Moist Heat;Electrical Stimulation     Moist Heat Therapy   Number Minutes Moist Heat 15 Minutes   Moist Heat Location Lumbar Spine     Electrical Stimulation   Electrical Stimulation Location Lumbar area   Electrical Stimulation Action IFC   Electrical Stimulation Parameters sitting to pt tolerance    Electrical Stimulation Goals Pain                  PT Short Term Goals - 12/14/15 0941      PT SHORT TERM GOAL #1   Title independent with initial HEP   Status Achieved           PT Long Term Goals - 01/19/16 1021      PT LONG TERM GOAL #1   Title understand posture and body mechanics   Status Achieved     PT LONG TERM GOAL #2   Title decrease pain 50%   Status On-going     PT LONG TERM GOAL #3   Title increase lumbar ROM 50%   Status On-going  PT LONG TERM GOAL #4   Title tolerate grocery shopping without increase of pain   Status Partially Met     PT LONG TERM GOAL #5   Title increase LE strength to 4/5   Status On-going               Plan - 01/19/16 1019    Clinical Impression Statement Pt return to therapy after 2 week, she was on vacation. Pt reports thst she has been able to walk more. Pt with abetter tolerance to activity this date. Pt completed all exercises without reporting increase pain. Pt did reports some discomfort with weights LAQ's with RLE initially but it subsided as with additional reps.   Rehab Potential Fair   PT Frequency 2x / week   PT Duration 8 weeks   PT Treatment/Interventions ADLs/Self Care Home Management;Cryotherapy;Electrical Stimulation;Moist Heat;Therapeutic exercise;Therapeutic activities;Functional mobility training;Ultrasound;Traction;Neuromuscular re-education;Patient/family education;Manual techniques   PT Next Visit Plan assess Tx, HS stretching in seated position      Patient will benefit from skilled therapeutic intervention in order to improve the following deficits and  impairments:  Decreased mobility, Decreased range of motion, Difficulty walking, Decreased strength, Impaired flexibility, Increased muscle spasms, Pain, Improper body mechanics  Visit Diagnosis: Difficulty in walking, not elsewhere classified  Bilateral low back pain without sciatica  Cramp and spasm     Problem List Patient Active Problem List   Diagnosis Date Noted  . BMI 31.0-31.9,adult 11/25/2015  . Depression 11/23/2015  . 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 Chronic lower back pain 06/18/2015    Scot Jun, PTA  01/19/2016, 10:22 AM  [place], [place], [address] Phone: [phone]   Fax:  [phone]  Name: Sarah Duncan MRN: [id number] Date of Birth: 09-06-1965

## 2016-01-27 ENCOUNTER — Ambulatory Visit: Payer: 59 | Attending: Physician Assistant | Admitting: Physical Therapy

## 2016-01-27 ENCOUNTER — Encounter: Payer: Self-pay | Admitting: Physical Therapy

## 2016-01-27 DIAGNOSIS — R262 Difficulty in walking, not elsewhere classified: Secondary | ICD-10-CM | POA: Diagnosis not present

## 2016-01-27 DIAGNOSIS — M545 Low back pain, unspecified: Secondary | ICD-10-CM

## 2016-01-27 DIAGNOSIS — R252 Cramp and spasm: Secondary | ICD-10-CM | POA: Insufficient documentation

## 2016-01-27 NOTE — Therapy (Signed)
[place], [place], [address] Phone: [phone]   Fax:  [phone]  Physical Therapy Treatment  Patient Details  Name: DEMIKA LANGENDERFER MRN: [id number] Date of Birth: 1966-05-15 Referring Provider: C. Jacqulynn Cadet  Encounter Date: 01/27/2016      PT End of Session - 01/27/16 1056    Visit Number 10   Date for PT Re-Evaluation 01/28/16   PT Start Time 0258   PT Stop Time 1110   PT Time Calculation (min) 55 min   Activity Tolerance Patient tolerated treatment well   Behavior During Therapy [facility] for tasks assessed/performed      Past Medical History:  Diagnosis Date  . Anemia   . Fibroids   . 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 ABDOMINAL HYSTERECTOMY  2007  . CESAREAN SECTION  11/19/1989  . 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 SPINE SURGERY  07/13/2015   Dr. Christia Reading  . TUBAL LIGATION  05/09/1993    There were no vitals filed for this visit.      Subjective Assessment - 01/27/16 1015    Subjective "Im ok"   Currently in Pain? Yes   Pain Score 7    Pain Location Back            [facility] PT Assessment - 01/27/16 0001      Posture/Postural Control   Posture Comments increased lordosis     AROM   Overall AROM Comments Lumbar ROM was decreased 50% with low back pain, Extension WFL                     [facility] Adult PT Treatment/Exercise - 01/27/16 0001      Lumbar Exercises: Aerobic   UBE (Upper Arm Bike) L3 x7 min      Lumbar Exercises: Seated   Long Arc Quad on Chair Both;2 sets;15 reps   LAQ on Chair Weights (lbs) 3     Lumbar Exercises: Supine   Other Supine Lumbar Exercises black Tband ext x15; Seated blue Tband rows 2x15; seated trunk rotations yellow ball 2x10     Knee/Hip Exercises: Standing   Hip Extension Both;10 reps;2 sets     Knee/Hip Exercises:  Seated   Marching Limitations 2x10   Marching Weights 3 lbs.   Hamstring Curl Both;1 set;10 reps   Hamstring Limitations blue Tband      Modalities   Modalities Moist Heat;Electrical Stimulation     Moist Heat Therapy   Number Minutes Moist Heat 15 Minutes   Moist Heat Location Lumbar Spine     Electrical Stimulation   Electrical Stimulation Location Lumbar area   Electrical Stimulation Action IFC   Electrical Stimulation Parameters sitting to pt tolerance   Electrical Stimulation Goals Pain                  PT Short Term Goals - 12/14/15 0941      PT SHORT TERM GOAL #1   Title independent with initial HEP   Status Achieved           PT Long Term Goals - 01/27/16 1059      PT LONG TERM GOAL #1   Title understand posture and body mechanics   Status Achieved     PT LONG TERM GOAL #2   Title decrease pain 50%  Status On-going     PT LONG TERM GOAL #3   Title increase lumbar ROM 50%   Status On-going     PT LONG TERM GOAL #4   Title tolerate grocery shopping without increase of pain   Status Partially Met     PT LONG TERM GOAL #5   Title increase LE strength to 4/5   Status On-going               Plan - 01/27/16 1056    Clinical Impression Statement Pt demos good carryover from previous treatment with a better tolerance to activity. Completed all of today's interventions but continues to report back pain throughout. Again pt reports that she can stand and walk more. Pt has also progressed increasing her lumbar ROM.   Rehab Potential Fair   PT Frequency 2x / week   PT Duration 8 weeks   PT Treatment/Interventions ADLs/Self Care Home Management;Cryotherapy;Electrical Stimulation;Moist Heat;Therapeutic exercise;Therapeutic activities;Functional mobility training;Ultrasound;Traction;Neuromuscular re-education;Patient/family education;Manual techniques   PT Next Visit Plan HS stretching in seated position, progress as tolerated       Patient  will benefit from skilled therapeutic intervention in order to improve the following deficits and impairments:  Decreased mobility, Decreased range of motion, Difficulty walking, Decreased strength, Impaired flexibility, Increased muscle spasms, Pain, Improper body mechanics  Visit Diagnosis: Difficulty in walking, not elsewhere classified  Cramp and spasm  Bilateral low back pain without sciatica     Problem List Patient Active Problem List   Diagnosis Date Noted  . BMI 31.0-31.9,adult 11/25/2015  . Depression 11/23/2015  . 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 Chronic lower back pain 06/18/2015    Scot Jun, PTA  01/27/2016, 10:59 AM  [place], [place], [address] Phone: [phone]   Fax:  [phone]  Name: CRISLYN WILLBANKS MRN: [id number] Date of Birth: Dec 11, 1965

## 2016-01-27 NOTE — Addendum Note (Signed)
Addended by: Sumner Boast on: 01/27/2016 01:09 PM   Modules accepted: Orders

## 2016-02-03 ENCOUNTER — Ambulatory Visit: Payer: 59 | Admitting: Physical Therapy

## 2016-02-03 ENCOUNTER — Encounter: Payer: Self-pay | Admitting: Physical Therapy

## 2016-02-03 ENCOUNTER — Other Ambulatory Visit: Payer: Self-pay | Admitting: Physician Assistant

## 2016-02-03 DIAGNOSIS — R262 Difficulty in walking, not elsewhere classified: Secondary | ICD-10-CM | POA: Diagnosis not present

## 2016-02-03 DIAGNOSIS — M545 Low back pain, unspecified: Secondary | ICD-10-CM

## 2016-02-03 DIAGNOSIS — R252 Cramp and spasm: Secondary | ICD-10-CM | POA: Diagnosis not present

## 2016-02-03 DIAGNOSIS — G8929 Other chronic pain: Secondary | ICD-10-CM

## 2016-02-03 NOTE — Therapy (Signed)
[place], [place], [address] Phone: [phone]   Fax:  [phone]  Physical Therapy Treatment  Patient Details  Name: Sarah Duncan MRN: [id number] Date of Birth: 03-20-1966 Referring Provider: C. Jacqulynn Cadet  Encounter Date: 02/03/2016      PT End of Session - 02/03/16 1010    Visit Number 11   Date for PT Re-Evaluation 02/27/16   PT Start Time 0930   PT Stop Time 1025   PT Time Calculation (min) 55 min   Activity Tolerance Patient tolerated treatment well   Behavior During Therapy [facility] for tasks assessed/performed      Past Medical History:  Diagnosis Date  . Anemia   . Fibroids   . 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 ABDOMINAL HYSTERECTOMY  2007  . CESAREAN SECTION  11/19/1989  . 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 SPINE SURGERY  07/13/2015   Dr. Christia Reading  . TUBAL LIGATION  05/09/1993    There were no vitals filed for this visit.      Subjective Assessment - 02/03/16 0933    Subjective "I had a set back, I called myself trying to clean up the house Monday, and was in the bed for 2 days after that"   Currently in Pain? Yes   Pain Score 8    Pain Location Back                         [facility] Adult PT Treatment/Exercise - 02/03/16 0001      Posture/Postural Control   Posture Comments increased lordosis     Lumbar Exercises: Stretches   Passive Hamstring Stretch 3 reps;10 seconds  sitting     Lumbar Exercises: Aerobic   UBE (Upper Arm Bike) L3 x7 min      Lumbar Exercises: Standing   Row 15 reps;Theraband  x2   Theraband Level (Row) Other (comment)  black   Other Standing Lumbar Exercises standing march 2x10   Other Standing Lumbar Exercises Standing OHP with blue ball 2x10; Standing ball overhead ext 2x10; Trunk rotations x10     Moist Heat  Therapy   Number Minutes Moist Heat 15 Minutes   Moist Heat Location Lumbar Spine     Electrical Stimulation   Electrical Stimulation Location Lumbar area   Electrical Stimulation Action IFC   Electrical Stimulation Parameters sitting to pt tolerance   Electrical Stimulation Goals Pain                  PT Short Term Goals - 12/14/15 0941      PT SHORT TERM GOAL #1   Title independent with initial HEP   Status Achieved           PT Long Term Goals - 01/27/16 1059      PT LONG TERM GOAL #1   Title understand posture and body mechanics   Status Achieved     PT LONG TERM GOAL #2   Title decrease pain 50%   Status On-going     PT LONG TERM GOAL #3   Title increase lumbar ROM 50%   Status On-going     PT LONG TERM GOAL #4   Title tolerate grocery shopping without increase of pain   Status Partially Met     PT  LONG TERM GOAL #5   Title increase LE strength to 4/5   Status On-going               Plan - 02/03/16 1011    Clinical Impression Statement Today's interventions performed in standing. Pt able to complete them all but reports increase pain. Pt reports no pain med's taken this morning.    PT Frequency 2x / week   PT Duration 8 weeks   PT Treatment/Interventions ADLs/Self Care Home Management;Cryotherapy;Electrical Stimulation;Moist Heat;Therapeutic exercise;Therapeutic activities;Functional mobility training;Ultrasound;Traction;Neuromuscular re-education;Patient/family education;Manual techniques   PT Next Visit Plan standing interventions      Patient will benefit from skilled therapeutic intervention in order to improve the following deficits and impairments:  Decreased mobility, Decreased range of motion, Difficulty walking, Decreased strength, Impaired flexibility, Increased muscle spasms, Pain, Improper body mechanics  Visit Diagnosis: Difficulty in walking, not elsewhere classified  Cramp and spasm  Bilateral low back pain without  sciatica     Problem List Patient Active Problem List   Diagnosis Date Noted  . BMI 31.0-31.9,adult 11/25/2015  . Depression 11/23/2015  . 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 Chronic lower back pain 06/18/2015    Scot Jun 02/03/2016, 10:15 AM  [place], [place], [address] Phone: [phone]   Fax:  [phone]  Name: Sarah Duncan MRN: [id number] Date of Birth: Sep 19, 1965

## 2016-02-08 MED ORDER — CYCLOBENZAPRINE HCL ER 15 MG PO CP24: 15.0000 mg | ORAL_CAPSULE | Freq: Every day | ORAL | 1 refills | Status: DC | PRN

## 2016-02-08 NOTE — Telephone Encounter (Signed)
[facility] is calling because the patient would like the amrix refill for 10MG  plain instead of 15MG  because it's cheaper. Please advise!

## 2016-02-08 NOTE — Telephone Encounter (Signed)
Chelle, it looks like you gave pt a month's trial of Amrix. Do you want to give her RFs to continue?

## 2016-02-08 NOTE — Telephone Encounter (Signed)
Meds ordered this encounter  Medications  . cyclobenzaprine (AMRIX) 15 MG 24 hr capsule    Sig: Take 1 capsule (15 mg total) by mouth daily as needed for muscle spasms.    Dispense:  90 capsule    Refill:  1

## 2016-02-09 ENCOUNTER — Ambulatory Visit: Payer: 59 | Admitting: Physical Therapy

## 2016-02-09 MED FILL — AMRIX 15 MG CAPSULE ER: 15 | 30 days supply | Qty: 30 | Fill #0

## 2016-02-09 NOTE — Telephone Encounter (Signed)
Called pharm to see if they are asking to change to regular flexeril instead of ER Amrix. They reported that the Amrix card is not working and they have called the pharm help # on card. They were told that because pt has [employer] ins, the card doesn't recognize the pharmacy as a pharm, but as a health system, and it will not approve it. According to pharm, the Amrix help desk stated they can not fix the problem. Chelle, do you want to OK switch to flexeril?

## 2016-02-09 NOTE — Telephone Encounter (Signed)
[facility] called back and they compared how it was run through last time when it went through to this time, and it was put through as 30 day supply last time. They changed current Rx to 30 day and it went through with no problem. No need to switch to IR.

## 2016-02-14 ENCOUNTER — Ambulatory Visit: Payer: 59 | Admitting: Physical Therapy

## 2016-02-14 ENCOUNTER — Encounter: Payer: Self-pay | Admitting: Physical Therapy

## 2016-02-14 DIAGNOSIS — R252 Cramp and spasm: Secondary | ICD-10-CM

## 2016-02-14 DIAGNOSIS — M545 Low back pain, unspecified: Secondary | ICD-10-CM

## 2016-02-14 DIAGNOSIS — R262 Difficulty in walking, not elsewhere classified: Secondary | ICD-10-CM

## 2016-02-14 NOTE — Therapy (Signed)
[place], [place], [address] Phone: [phone]   Fax:  [phone]  Physical Therapy Treatment  Patient Details  Name: Sarah Duncan MRN: [id number] Date of Birth: 07/06/1965 Referring Provider: C. Jacqulynn Cadet  Encounter Date: 02/14/2016      PT End of Session - 02/14/16 0841    Visit Number 12   Date for PT Re-Evaluation 02/27/16   PT Start Time 0758   PT Stop Time 0857   PT Time Calculation (min) 59 min   Activity Tolerance Patient tolerated treatment well   Behavior During Therapy [facility] for tasks assessed/performed      Past Medical History:  Diagnosis Date  . Anemia   . Fibroids   . 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 ABDOMINAL HYSTERECTOMY  2007  . CESAREAN SECTION  11/19/1989  . 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 SPINE SURGERY  07/13/2015   Dr. Christia Reading  . TUBAL LIGATION  05/09/1993    There were no vitals filed for this visit.      Subjective Assessment - 02/14/16 0759    Subjective "Ive been ok, I cant complain this time"   Currently in Pain? Yes   Pain Score 6   "Im medicated"   Pain Location Back   Pain Orientation Left;Right                         [facility] Adult PT Treatment/Exercise - 02/14/16 0001      Lumbar Exercises: Aerobic   UBE (Upper Arm Bike) L3 x7 min      Lumbar Exercises: Machines for Strengthening   Cybex Knee Extension 5lb 2x10   Cybex Knee Flexion 15lb 2x10   Other Lumbar Machine Exercise Seated rows & lats 20lb 2x10     Lumbar Exercises: Standing   Row 15 reps;Theraband   Theraband Level (Row) Level 3 (Green)   Shoulder Extension Strengthening;Both;10 reps   Theraband Level (Shoulder Extension) Level 3 (Green)   Other Standing Lumbar Exercises standing march 2x10   Other Standing Lumbar Exercises Standing OHP with blue  ball 2x10; Standing ball overhead ext 2x10; Trunk rotations x10     Lumbar Exercises: Seated   Sit to Stand 5 reps  x3 with UE support     Moist Heat Therapy   Number Minutes Moist Heat 15 Minutes   Moist Heat Location Lumbar Spine     Electrical Stimulation   Electrical Stimulation Location Lumbar area   Electrical Stimulation Action IFC   Electrical Stimulation Parameters sitting   Electrical Stimulation Goals Pain                  PT Short Term Goals - 12/14/15 0941      PT SHORT TERM GOAL #1   Title independent with initial HEP   Status Achieved           PT Long Term Goals - 01/27/16 1059      PT LONG TERM GOAL #1   Title understand posture and body mechanics   Status Achieved     PT LONG TERM GOAL #2   Title decrease pain 50%   Status On-going     PT LONG TERM GOAL #3   Title increase lumbar ROM 50%   Status On-going  PT LONG TERM GOAL #4   Title tolerate grocery shopping without increase of pain   Status Partially Met     PT LONG TERM GOAL #5   Title increase LE strength to 4/5   Status On-going               Plan - 02/14/16 0841    Clinical Impression Statement Pt has progressed with her activity tolerance. She was able to perform more interventions in standing. Pt also able to tolerate addition machine level interventions. Pt reports that activity does cause her pain to flare up more.    Rehab Potential Fair   PT Frequency 2x / week   PT Duration 8 weeks   PT Treatment/Interventions ADLs/Self Care Home Management;Cryotherapy;Electrical Stimulation;Moist Heat;Therapeutic exercise;Therapeutic activities;Functional mobility training;Ultrasound;Traction;Neuromuscular re-education;Patient/family education;Manual techniques   PT Next Visit Plan progress as tolerated      Patient will benefit from skilled therapeutic intervention in order to improve the following deficits and impairments:  Decreased mobility, Decreased range of  motion, Difficulty walking, Decreased strength, Impaired flexibility, Increased muscle spasms, Pain, Improper body mechanics  Visit Diagnosis: Difficulty in walking, not elsewhere classified  Bilateral low back pain without sciatica  Cramp and spasm     Problem List Patient Active Problem List   Diagnosis Date Noted  . BMI 31.0-31.9,adult 11/25/2015  . Depression 11/23/2015  . 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 Chronic lower back pain 06/18/2015    Scot Jun 02/14/2016, 8:44 AM  [place], [place], [address] Phone: [phone]   Fax:  [phone]  Name: SHAN PADGETT MRN: [id number] Date of Birth: Dec 13, 1965

## 2016-02-24 ENCOUNTER — Encounter: Payer: Self-pay | Admitting: Physical Therapy

## 2016-02-24 ENCOUNTER — Ambulatory Visit: Payer: 59 | Attending: Physician Assistant | Admitting: Physical Therapy

## 2016-02-24 DIAGNOSIS — R252 Cramp and spasm: Secondary | ICD-10-CM | POA: Diagnosis not present

## 2016-02-24 DIAGNOSIS — M545 Low back pain: Secondary | ICD-10-CM | POA: Diagnosis not present

## 2016-02-24 DIAGNOSIS — R262 Difficulty in walking, not elsewhere classified: Secondary | ICD-10-CM | POA: Diagnosis not present

## 2016-02-24 DIAGNOSIS — G8929 Other chronic pain: Secondary | ICD-10-CM | POA: Diagnosis not present

## 2016-02-24 NOTE — Therapy (Signed)
[place], [place], [address] Phone: [phone]   Fax:  [phone]  Physical Therapy Treatment  Patient Details  Name: Sarah Duncan MRN: [id number] Date of Birth: 01-06-1966 Referring Provider: C. Jacqulynn Cadet  Encounter Date: 02/24/2016      PT End of Session - 02/24/16 1008    PT Start Time 0930   PT Stop Time 1027   PT Time Calculation (min) 57 min   Activity Tolerance Patient tolerated treatment well   Behavior During Therapy [facility] for tasks assessed/performed      Past Medical History:  Diagnosis Date  . Anemia   . Fibroids   . 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 ABDOMINAL HYSTERECTOMY  2007  . CESAREAN SECTION  11/19/1989  . 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 SPINE SURGERY  07/13/2015   Dr. Christia Reading  . TUBAL LIGATION  05/09/1993    There were no vitals filed for this visit.      Subjective Assessment - 02/24/16 0934    Subjective "Things have been going ok"   Currently in Pain? Yes   Pain Score 3   On med's   Pain Location Back   Pain Orientation Left;Right                         [facility] Adult PT Treatment/Exercise - 02/24/16 0001      Lumbar Exercises: Aerobic   UBE (Upper Arm Bike) L3 x7 min      Lumbar Exercises: Machines for Strengthening   Cybex Knee Extension 5lb 2x10   Cybex Knee Flexion 20lb 2x10   Other Lumbar Machine Exercise Seated rows & lats 20lb 2x10     Lumbar Exercises: Standing   Row 15 reps;Theraband   Theraband Level (Row) Level 3 (Green)   Shoulder Extension Strengthening;Both;10 reps  x2   Theraband Level (Shoulder Extension) Level 3 (Green)   Other Standing Lumbar Exercises standing march 2x10; Standing hip abd 2x10   Other Standing Lumbar Exercises Standing OHP with yellow ball 2x10; Standing ball overhead ext 2x10;  Trunk rotations x10     Moist Heat Therapy   Number Minutes Moist Heat 15 Minutes   Moist Heat Location Lumbar Spine     Electrical Stimulation   Electrical Stimulation Location Lumbar area   Electrical Stimulation Action IFC   Electrical Stimulation Parameters sitting   Electrical Stimulation Goals Pain                  PT Short Term Goals - 12/14/15 0941      PT SHORT TERM GOAL #1   Title independent with initial HEP   Status Achieved           PT Long Term Goals - 02/24/16 1020      PT LONG TERM GOAL #1   Title understand posture and body mechanics   Status Achieved     PT LONG TERM GOAL #2   Status On-going     PT LONG TERM GOAL #3   Title increase lumbar ROM 50%   Status Partially Met     PT LONG TERM GOAL #4   Title tolerate grocery shopping without increase of pain   Status Partially Met     PT LONG TERM GOAL #5   Title increase  LE strength to 4/5   Status On-going             Patient will benefit from skilled therapeutic intervention in order to improve the following deficits and impairments:  Decreased mobility, Decreased range of motion, Difficulty walking, Decreased strength, Impaired flexibility, Increased muscle spasms, Pain, Improper body mechanics  Visit Diagnosis: Chronic bilateral low back pain without sciatica  Difficulty in walking, not elsewhere classified  Cramp and spasm     Problem List Patient Active Problem List   Diagnosis Date Noted  . BMI 31.0-31.9,adult 11/25/2015  . Depression 11/23/2015  . 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 Chronic lower back pain 06/18/2015    Scot Jun, PTA  02/24/2016, 10:21 AM  [place], [place], [address] Phone: [phone]   Fax:  [phone]  Name: ITZAMARA CASAS MRN: [id number] Date of Birth: 05-03-1966

## 2016-02-28 ENCOUNTER — Encounter: Payer: Self-pay | Admitting: Physician Assistant

## 2016-03-02 ENCOUNTER — Ambulatory Visit: Payer: 59 | Admitting: Physical Therapy

## 2016-03-03 ENCOUNTER — Ambulatory Visit (INDEPENDENT_AMBULATORY_CARE_PROVIDER_SITE_OTHER): Payer: 59

## 2016-03-03 ENCOUNTER — Encounter: Payer: Self-pay | Admitting: Physician Assistant

## 2016-03-03 ENCOUNTER — Ambulatory Visit (INDEPENDENT_AMBULATORY_CARE_PROVIDER_SITE_OTHER): Payer: 59 | Admitting: Physician Assistant

## 2016-03-03 VITALS — BP 122/80 | HR 94 | Temp 98.6°F | Resp 18 | Ht 67.5 in | Wt 224.0 lb

## 2016-03-03 DIAGNOSIS — G8929 Other chronic pain: Secondary | ICD-10-CM | POA: Diagnosis not present

## 2016-03-03 DIAGNOSIS — Z23 Encounter for immunization: Secondary | ICD-10-CM

## 2016-03-03 DIAGNOSIS — M545 Low back pain: Secondary | ICD-10-CM

## 2016-03-03 MED ORDER — LIDOCAINE 5 % EX PTCH: 1.0000 | MEDICATED_PATCH | CUTANEOUS | 0 refills | Status: DC

## 2016-03-03 MED ORDER — PREGABALIN 200 MG PO CAPS: 200.0000 mg | ORAL_CAPSULE | Freq: Three times a day (TID) | ORAL | 0 refills | Status: DC

## 2016-03-03 MED ORDER — HYDROCODONE-ACETAMINOPHEN 5-325 MG PO TABS: 1.0000 | ORAL_TABLET | Freq: Four times a day (QID) | ORAL | 0 refills | Status: DC | PRN

## 2016-03-03 MED FILL — LIDOCAINE 5% PATCH: 5 | 30 days supply | Qty: 30 | Fill #0

## 2016-03-03 NOTE — Patient Instructions (Signed)
     IF you received an x-ray today, you will receive an invoice from [facility]. Please contact [facility] at [phone] with questions or concerns regarding your invoice.   IF you received labwork today, you will receive an invoice from [facility]. Please contact [facility] at [phone] with questions or concerns regarding your invoice.   Our billing staff will not be able to assist you with questions regarding bills from these companies.  You will be contacted with the lab results as soon as they are available. The fastest way to get your results is to activate your My Chart account. Instructions are located on the last page of this paperwork. If you have not heard from us regarding the results in 2 weeks, please contact this office.      

## 2016-03-03 NOTE — Progress Notes (Signed)
Patient ID: Sarah Duncan, female    DOB: 1965/08/13, 50 y.o.   MRN: [id number]  PCP: Harrison Mons, PA-C  Subjective:   Chief Complaint  Patient presents with  . Back Pain    SINCE LAST YEAR  . Medication Refill    lyrica and hydrocodone    HPI Presents for evaluation of worsening back pain.  Back pain is now constant. "It won't let up." PT sessions make it worse.  My Chart message she sent on 10/09: "Good Morning Dashanti Burr  I have an appointment with you on November 21 but I'm gonna have to see you before then.Marland KitchenMarland KitchenI've been in physical therapy for a little over 2 months and I don't feel that it's working.Marland KitchenMarland KitchenMarland KitchenI'm in CONSTANT pain...PT makes it worse.Marland KitchenMarland KitchenI try to stay medicated when I go but my pain over powers the Medicine.Marland KitchenMarland KitchenMarland KitchenI've tried EVERYTHING...meds ,heating pad, baths with epsom salt, lidocaine patches, massages, etc..Marland KitchenThursday I had an episode where I was trying to prepare dinner for [facility] and my back gave out and I nearly hit the floor..The counter broke my fall...this morning I had another episode where I couldn't move...with the help of Tawanda and my walker I was able to get to the bathroom.Marland KitchenMarland KitchenI can't stand for a long period of time and I can't sit for a long period of time.Marland KitchenMarland KitchenI have a PT appointment with Jori Moll on Thursday but I need to see you before then if possible...thanks in advance..."  Our nurse advised her to come in today to see me.  The episode on Thursday was due to pain, not leg weakness. She had one brief episode of radicular pain from the RIGHT buttock into the RIGHT leg. No loss of bowel or bladder changes. Her wife notes that she moans in her sleep when she rolls over, and her sleep is restless.   At her visit in July, we started Amrix, lidocaine patches and pregabalin. Regular cyclobenzaprine had caused too much drowsiness. Gabapentin didn't help and caused constipation. She relates that steroid injections in the past have not helped.  I note  that she hasn't requested refills of the medications since they were originally written in July.    Review of Systems As above.    Patient Active Problem List   Diagnosis Date Noted  . BMI 31.0-31.9,adult 11/25/2015  . Depression 11/23/2015  . 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 Chronic lower back pain 06/18/2015     Prior to Admission medications   Medication Sig Start Date End Date Taking? Authorizing Provider  acetaminophen (TYLENOL) 650 MG CR tablet Take 650 mg by mouth every 8 (eight) hours as needed for pain.   Yes Historical Provider, MD  BIOTIN PO Take by mouth daily.   Yes Historical Provider, MD  cyclobenzaprine (AMRIX) 15 MG 24 hr capsule Take 1 capsule (15 mg total) by mouth daily as needed for muscle spasms. 02/08/16  Yes Deliana Avalos, PA-C  HYDROcodone-acetaminophen (NORCO/VICODIN) 5-325 MG tablet Take 1 tablet by mouth every 6 (six) hours as needed for moderate pain. Reported on 11/25/2015 11/25/15  Yes Kenyetta Fife, PA-C  lidocaine (LIDODERM) 5 % Place 1 patch onto the skin daily. Remove & Discard patch within 12 hours or as directed by MD 01/03/16  Yes [place]ton Mielke, PA-C  MELATONIN PO Take by mouth daily.   Yes Historical Provider, MD  Multiple Vitamin (MULTIVITAMIN) tablet Take 1 tablet by mouth daily.   Yes Historical Provider, MD  pregabalin (LYRICA) 150 MG capsule Take 1 capsule (150 mg total) by mouth  2 (two) times daily. 11/25/15  Yes Jameria Bradway, PA-C     No Known Allergies     Objective:  Physical Exam  Constitutional: She is oriented to person, place, and time. She appears well-developed and well-nourished. She is active and cooperative. No distress.  BP 122/80 (BP Location: Right Arm, Patient Position: Sitting, Cuff Size: Large)   Pulse 94   Temp 98.6 F (37 C) (Oral)   Resp 18   Ht 5' 7.5" (1.715 m)   Wt 224 lb (101.6 kg)   SpO2 99%   BMI 34.57 kg/m    Eyes: Conjunctivae are normal.  Pulmonary/Chest: Effort normal.  Musculoskeletal:        Cervical back: Normal.       Thoracic back: Normal.       Lumbar back: She exhibits decreased range of motion, tenderness, bony tenderness and pain. She exhibits no swelling, no edema, no deformity, no laceration, no spasm and normal pulse.  Neurological: She is alert and oriented to person, place, and time. She has normal strength. No sensory deficit.  Reflex Scores:      Patellar reflexes are 1+ on the right side and 1+ on the left side.      Achilles reflexes are 1+ on the right side and 1+ on the left side. Psychiatric: She has a normal mood and affect. Her speech is normal and behavior is normal.     Dg Lumbar Spine Complete  Result Date: 03/03/2016 CLINICAL DATA:  Worsening low back pain, history of disc herniation EXAM: LUMBAR SPINE - COMPLETE 4+ VIEW COMPARISON:  None. FINDINGS: Five views of the lumbar spine submitted. No acute fracture or subluxation. Again noted disc space flattening with vacuum disc phenomenon and mild anterior spurring at L4-L5 level. Stable disc space flattening at L5-S1 level. Facet degenerative changes L4 and L5 level. Alignment and vertebral body heights are preserved. IMPRESSION: No acute fracture or subluxation. Degenerative changes as described above. Electronically Signed   By: Lahoma Crocker M.D.   On: 03/03/2016 14:47         Assessment & Plan:   1. Chronic bilateral low back pain without sciatica DJD. Resume previously prescribed treatment, but INCREASE pregabalin to 200 mg TID and increase hydrocodone to 1-2 Q hours PRN. Refer back to orthopedics (previously in [place]) for additional evaluation and treatment. - DG Lumbar Spine Complete; Future - Ambulatory referral to Orthopedic Surgery - pregabalin (LYRICA) 200 MG capsule; Take 1 capsule (200 mg total) by mouth 3 (three) times daily.  Dispense: 90 capsule; Refill: 0 - HYDROcodone-acetaminophen (NORCO/VICODIN) 5-325 MG tablet; Take 1-2 tablets by mouth every 6 (six) hours as needed for moderate pain.  Reported on 11/25/2015  Dispense: 60 tablet; Refill: 0 - lidocaine (LIDODERM) 5 %; Place 1 patch onto the skin daily. Remove & Discard patch within 12 hours or as directed by MD  Dispense: 30 patch; Refill: 0  2. Need for prophylactic vaccination and inoculation against influenza - Flu Vaccine QUAD 36+ mos IM  Re-evaluate in November, as previously planned.   Fara Chute, PA-C Physician Assistant-Certified [facility]

## 2016-03-06 MED FILL — HYDROCODON-APAP 5-325: 5-325 | 7 days supply | Qty: 60 | Fill #0

## 2016-03-06 MED FILL — LYRICA 200 MG CAPSULE: 200 | 30 days supply | Qty: 90 | Fill #0

## 2016-03-30 ENCOUNTER — Ambulatory Visit (INDEPENDENT_AMBULATORY_CARE_PROVIDER_SITE_OTHER): Payer: 59 | Admitting: Physician Assistant

## 2016-03-30 VITALS — BP 130/88 | HR 80 | Temp 98.3°F | Resp 17 | Ht 67.5 in | Wt 229.0 lb

## 2016-03-30 DIAGNOSIS — F329 Major depressive disorder, single episode, unspecified: Secondary | ICD-10-CM

## 2016-03-30 DIAGNOSIS — F32A Depression, unspecified: Secondary | ICD-10-CM

## 2016-03-30 DIAGNOSIS — M545 Low back pain, unspecified: Secondary | ICD-10-CM

## 2016-03-30 DIAGNOSIS — G8929 Other chronic pain: Secondary | ICD-10-CM

## 2016-03-30 MED ORDER — HYDROCODONE-ACETAMINOPHEN 5-325 MG PO TABS: 1.0000 | ORAL_TABLET | Freq: Three times a day (TID) | ORAL | 0 refills | Status: DC | PRN

## 2016-03-30 MED ORDER — ESCITALOPRAM OXALATE 10 MG PO TABS: 10.0000 mg | ORAL_TABLET | Freq: Every day | ORAL | 3 refills | Status: DC

## 2016-03-30 MED FILL — ESCITALOPRAM 10 MG TABLET: 10 | 30 days supply | Qty: 30 | Fill #0

## 2016-03-30 NOTE — Progress Notes (Signed)
Subjective:   Chief Complaint  Patient presents with  . Disability paperwork  . Depression    Per screening   Patient Care Team: Harrison Mons, PA-C as PCP - General (Family Medicine)    Patient ID: Sarah Duncan, female    DOB: Jun 14, 1965, 50 y.o.   MRN: [id number]  HPI: Presents for completion of disability paperwork for her back pain and recent depressive symptoms.   States she was first diagnosed with MDD back in the 90's with all of the issues with her ex-husband. Patient separated from her husband due to infidelity and at that time (in the 24's) was hospitalized at inpatient behavioral health for MDD and suicidal and homicidal ideation. States she was prescribed Zoloft at this time but only took it for a short period and took herself off of it own her own because of bad nightmares. States she has been able to self-manage her depression on her own in the interim and has not noticed it being unmanageable until recently. States she is not sleeping well, often tearful, not up to her normal activity and feels "not worthy". States she does not have the energy to do the typical chores around the house and although her wife never says anything to her, she feels very bad about this and down on herself. She notes her mind is often "racing" preventing her from sleeping and she has decreased concentration. Also has chronic back pain which may contribute. She denies current self-harm, suicidal ideation, or plan.  Patient was seen a month ago for her back pain and referral to orthopedics was made at that time. Appointment scheduled for 04/12/2016. States her pain is consistent in her lower back, no sciatica, and mildly relieved with current regimen of Cyclobenzaprine, Hydrocodone-Acetaminophen, Lidocaine patches, and Pregabalin. States she is currently taking her Hydrocodone-Acetaminophen three times a day to keep her pain under control. Recently weight gain of 30+ lbs and wonders if this is  from the Lyrica.  Review of Systems  Constitutional: Positive for activity change, fatigue and unexpected weight change. Negative for chills and fever.  HENT: Negative for congestion, ear pain, rhinorrhea, sinus pain, sinus pressure and sore throat.   Respiratory: Negative for cough, chest tightness and shortness of breath.   Cardiovascular: Negative for chest pain and palpitations.  Gastrointestinal: Negative for abdominal pain, constipation, diarrhea, nausea and vomiting.  Genitourinary: Negative for dysuria, frequency, hematuria and urgency.  Musculoskeletal: Positive for back pain.  Psychiatric/Behavioral: Positive for agitation, decreased concentration, dysphoric mood and sleep disturbance. Negative for hallucinations, self-injury and suicidal ideas. The patient is not nervous/anxious.    No Known Allergies  Prior to Admission medications   Medication Sig Start Date End Date Taking? Authorizing Provider  acetaminophen (TYLENOL) 650 MG CR tablet Take 650 mg by mouth every 8 (eight) hours as needed for pain.   Yes Historical Provider, MD  BIOTIN PO Take by mouth daily.   Yes Historical Provider, MD  cyclobenzaprine (AMRIX) 15 MG 24 hr capsule Take 1 capsule (15 mg total) by mouth daily as needed for muscle spasms. 02/08/16  Yes Chelle Jeffery, PA-C  HYDROcodone-acetaminophen (NORCO/VICODIN) 5-325 MG tablet Take 1 tablet by mouth every 8 (eight) hours as needed for severe pain. 03/30/16  Yes Chelle Jeffery, PA-C  lidocaine (LIDODERM) 5 % Place 1 patch onto the skin daily. Remove & Discard patch within 12 hours or as directed by MD 03/03/16  Yes Chelle Jeffery, PA-C  MELATONIN PO Take by mouth daily.   Yes Historical  Provider, MD  Multiple Vitamin (MULTIVITAMIN) tablet Take 1 tablet by mouth daily.   Yes Historical Provider, MD  pregabalin (LYRICA) 200 MG capsule Take 1 capsule (200 mg total) by mouth 3 (three) times daily. 03/03/16  Yes Chelle Jeffery, PA-C  escitalopram (LEXAPRO) 10 MG  tablet Take 1 tablet (10 mg total) by mouth daily. 03/30/16   Harrison Mons, PA-C   Patient Active Problem List   Diagnosis Date Noted  . BMI 34.0-34.9,adult 11/25/2015  . Depression 11/23/2015  . 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 Chronic lower back pain 06/18/2015      Objective: Blood pressure 130/88, pulse 80, temperature 98.3 F (36.8 C), temperature source Oral, resp. rate 17, height 5' 7.5" (1.715 m), weight 229 lb (103.9 kg), SpO2 100 %.   Physical Exam  Constitutional: She is oriented to person, place, and time. She appears well-developed and well-nourished. No distress.  HENT:  Head: Normocephalic and atraumatic.  Eyes: Conjunctivae are normal. Pupils are equal, round, and reactive to light. Right eye exhibits no discharge. Left eye exhibits no discharge. No scleral icterus.  Neck: Normal range of motion. Neck supple. No tracheal deviation present. No thyromegaly present.  Cardiovascular: Normal rate, regular rhythm, normal heart sounds and intact distal pulses.  Exam reveals no gallop and no friction rub.   No murmur heard. Pulmonary/Chest: Effort normal and breath sounds normal. No stridor. No respiratory distress. She has no wheezes. She has no rales.  Lymphadenopathy:    She has no cervical adenopathy.  Neurological: She is alert and oriented to person, place, and time.  Skin: Skin is warm and dry. She is not diaphoretic.  Psychiatric: She has a normal mood and affect. Her behavior is normal. Judgment and thought content normal.          Assessment & Plan:  1. Depression, unspecified depression type Patient interested in starting new medication for her depressive symptoms. Discussed side effects will usually wear off after first 2 weeks and full effect will not be noticed until 4-6 weeks. Advised to RTC if continued side effects or mood not improving after that period of time. - escitalopram (LEXAPRO) 10 MG tablet; Take 1 tablet (10 mg total) by mouth daily.  Dispense: 30  tablet; Refill: 3  2. Chronic bilateral low back pain without sciatica Refill of Norco to hold patient over until visit with orthopedics in 2 weeks. - HYDROcodone-acetaminophen (NORCO/VICODIN) 5-325 MG tablet; Take 1 tablet by mouth every 8 (eight) hours as needed for severe pain.  Dispense: 90 tablet; Refill: 0

## 2016-03-30 NOTE — Progress Notes (Signed)
Patient ID: Sarah Duncan, female    DOB: 1965-12-18, 50 y.o.   MRN: [id number]  PCP: Harrison Mons, PA-C  Chief Complaint  Patient presents with  . Disability paperwork  . Depression    Per screening    Subjective:    HPI Presents for completion of disability paperwork. On screening at intake, she was noted to be depressed.  The disability is due to her back pain. This is a chronic issue, for which she underwent surgery in [place] before moving to [place]. Pain persists, despite PT and medication. She's been referred to ortho for another evaluation, may need repeat neurosurgical evaluaiton. She is frustrated by weight gain since starting pregabalin, and isn't sure that it's even helping.  Previously depressed in the 1990's when she discovered her husband had another partner, with whom he had a child. She "had a nervous breakdown" and was admitted to a behavioral health facility. At the time she had ideations of hurting herself "and him and everybody." She took sertraline briefly, but stopped it due to intolerable nightmares.  Recently she relates poor sleep, emotional, fatigue and "not worthy." She feels bad about not doing the housekeeping of her home, though her wife hasn't mentioned anything about it and is very supportive. Reduced concentration, can't quiet her mind to sleep. No thoughts of harming herself or anyone else.    Review of Systems Constitutional: Positive for activity change, fatigue and unexpected weight change. Negative for chills and fever.  HENT: Negative for congestion, ear pain, rhinorrhea, sinus pain, sinus pressure and sore throat.   Respiratory: Negative for cough, chest tightness and shortness of breath.   Cardiovascular: Negative for chest pain and palpitations.  Gastrointestinal: Negative for abdominal pain, constipation, diarrhea, nausea and vomiting.  Genitourinary: Negative for dysuria, frequency, hematuria and urgency.    Musculoskeletal: Positive for back pain.  Psychiatric/Behavioral: Positive for agitation, decreased concentration, dysphoric mood and sleep disturbance. Negative for hallucinations, self-injury and suicidal ideas. The patient is not nervous/anxious.       Patient Active Problem List   Diagnosis Date Noted  . BMI 34.0-34.9,adult 11/25/2015  . Depression 11/23/2015  . 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 Chronic lower back pain 06/18/2015     Prior to Admission medications   Medication Sig Start Date End Date Taking? Authorizing Provider  acetaminophen (TYLENOL) 650 MG CR tablet Take 650 mg by mouth every 8 (eight) hours as needed for pain.   Yes Historical Provider, MD  BIOTIN PO Take by mouth daily.   Yes Historical Provider, MD  cyclobenzaprine (AMRIX) 15 MG 24 hr capsule Take 1 capsule (15 mg total) by mouth daily as needed for muscle spasms. 02/08/16  Yes Lielle Vandervort, PA-C  HYDROcodone-acetaminophen (NORCO/VICODIN) 5-325 MG tablet Take 1-2 tablets by mouth every 6 (six) hours as needed for moderate pain. Reported on 11/25/2015 03/03/16  Yes Mylea Roarty, PA-C  lidocaine (LIDODERM) 5 % Place 1 patch onto the skin daily. Remove & Discard patch within 12 hours or as directed by MD 03/03/16  Yes Jobeth Pangilinan, PA-C  MELATONIN PO Take by mouth daily.   Yes Historical Provider, MD  Multiple Vitamin (MULTIVITAMIN) tablet Take 1 tablet by mouth daily.   Yes Historical Provider, MD  pregabalin (LYRICA) 200 MG capsule Take 1 capsule (200 mg total) by mouth 3 (three) times daily. 03/03/16  Yes Modelle Vollmer, PA-C     No Known Allergies     Objective:  Physical Exam  Constitutional: She is oriented to person, place,  and time. She appears well-developed and well-nourished. She is active and cooperative. No distress (but obviously uncomfortable sitting in the exam room).  BP 130/88 (BP Location: Right Arm, Patient Position: Sitting, Cuff Size: Large)   Pulse 80   Temp 98.3 F (36.8 C) (Oral)    Resp 17   Ht 5' 7.5" (1.715 m)   Wt 229 lb (103.9 kg)   SpO2 100%   BMI 35.34 kg/m   HENT:  Head: Normocephalic and atraumatic.  Right Ear: Hearing normal.  Left Ear: Hearing normal.  Eyes: Conjunctivae are normal. No scleral icterus.  Neck: Normal range of motion. Neck supple. No thyromegaly present.  Cardiovascular: Normal rate, regular rhythm and normal heart sounds.   Pulses:      Radial pulses are 2+ on the right side, and 2+ on the left side.  Pulmonary/Chest: Effort normal and breath sounds normal.  Lymphadenopathy:       Head (right side): No tonsillar, no preauricular, no posterior auricular and no occipital adenopathy present.       Head (left side): No tonsillar, no preauricular, no posterior auricular and no occipital adenopathy present.    She has no cervical adenopathy.       Right: No supraclavicular adenopathy present.       Left: No supraclavicular adenopathy present.  Neurological: She is alert and oriented to person, place, and time. No sensory deficit.  Skin: Skin is warm, dry and intact. No rash noted. No cyanosis or erythema. Nails show no clubbing.  Psychiatric: She has a normal mood and affect. Her speech is normal and behavior is normal.           Assessment & Plan:   1. Depression, unspecified depression type Try another SSRI, escitalopram. If adverse effects, or no benefit, would select venlafaxine or duloxetine, given her chronic back pain. - escitalopram (LEXAPRO) 10 MG tablet; Take 1 tablet (10 mg total) by mouth daily.  Dispense: 30 tablet; Refill: 3  2. Chronic bilateral low back pain without sciatica Proceed with ortho visit 04/12/2016.  - HYDROcodone-acetaminophen (NORCO/VICODIN) 5-325 MG tablet; Take 1 tablet by mouth every 8 (eight) hours as needed for severe pain.  Dispense: 90 tablet; Refill: 0   Fara Chute, PA-C Physician Assistant-Certified [facility]

## 2016-03-30 NOTE — Patient Instructions (Signed)
     IF you received an x-ray today, you will receive an invoice from [facility]. Please contact [facility] at [phone] with questions or concerns regarding your invoice.   IF you received labwork today, you will receive an invoice from [facility]. Please contact [facility] at [phone] with questions or concerns regarding your invoice.   Our billing staff will not be able to assist you with questions regarding bills from these companies.  You will be contacted with the lab results as soon as they are available. The fastest way to get your results is to activate your My Chart account. Instructions are located on the last page of this paperwork. If you have not heard from us regarding the results in 2 weeks, please contact this office.      

## 2016-04-05 MED FILL — HYDROCODON-APAP 5-325: 5-325 | 30 days supply | Qty: 90 | Fill #0

## 2016-04-11 ENCOUNTER — Encounter: Payer: Self-pay | Admitting: Physician Assistant

## 2016-04-11 ENCOUNTER — Ambulatory Visit (INDEPENDENT_AMBULATORY_CARE_PROVIDER_SITE_OTHER): Payer: 59 | Admitting: Physician Assistant

## 2016-04-11 ENCOUNTER — Telehealth: Payer: Self-pay

## 2016-04-11 VITALS — BP 110/74 | HR 90 | Temp 98.3°F | Resp 16 | Ht 67.5 in | Wt 223.8 lb

## 2016-04-11 DIAGNOSIS — F329 Major depressive disorder, single episode, unspecified: Secondary | ICD-10-CM

## 2016-04-11 DIAGNOSIS — F32A Depression, unspecified: Secondary | ICD-10-CM

## 2016-04-11 MED ORDER — ESCITALOPRAM OXALATE 20 MG PO TABS: 20.0000 mg | ORAL_TABLET | Freq: Every day | ORAL | 3 refills | Status: DC

## 2016-04-11 NOTE — Patient Instructions (Signed)
     IF you received an x-ray today, you will receive an invoice from [facility]. Please contact [facility] at [phone] with questions or concerns regarding your invoice.   IF you received labwork today, you will receive an invoice from [facility]. Please contact [facility] at [phone] with questions or concerns regarding your invoice.   Our billing staff will not be able to assist you with questions regarding bills from these companies.  You will be contacted with the lab results as soon as they are available. The fastest way to get your results is to activate your My Chart account. Instructions are located on the last page of this paperwork. If you have not heard from us regarding the results in 2 weeks, please contact this office.      

## 2016-04-11 NOTE — Progress Notes (Signed)
Patient ID: Sarah Duncan, female    DOB: 06-05-1965, 50 y.o.   MRN: [id number]  PCP: Harrison Mons, PA-C  Chief Complaint  Patient presents with  . Depression    depression screen during triage, score 22, form for [employer]     Subjective:   Presents for evaluation of depression.  Depression is long-standing. At her visit 03/30/2016 started escitalopram. Had a nightmare about zombies night before last. Taking it without food causes her to feel bad. Neither she nor her wife have noticed any difference in her mood.  Her appointment with orthopedics is tomorrow with Dr. Lorin Mercy.   Review of Systems Depression screen [facility] 2/9 04/11/2016 03/30/2016 03/03/2016 11/25/2015 11/25/2015  Decreased Interest 3 0 3 0 0  Down, Depressed, Hopeless 2 2 3  0 0  PHQ - 2 Score 5 2 6  0 0  Altered sleeping 3 3 3  - -  Tired, decreased energy 3 3 3  - -  Change in appetite 3 0 2 - -  Feeling bad or failure about yourself  3 3 2  - -  Trouble concentrating 3 3 3  - -  Moving slowly or fidgety/restless 2 3 0 - -  Suicidal thoughts 0 0 0 - -  PHQ-9 Score 22 17 19  - -  Difficult doing work/chores Very difficult Very difficult - - -       Patient Active Problem List   Diagnosis Date Noted  . BMI 34.0-34.9,adult 11/25/2015  . Depression 11/23/2015  . 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 Chronic lower back pain 06/18/2015     Prior to Admission medications   Medication Sig Start Date End Date Taking? Authorizing Provider  acetaminophen (TYLENOL) 650 MG CR tablet Take 650 mg by mouth every 8 (eight) hours as needed for pain.   Yes Historical Provider, MD  BIOTIN PO Take by mouth daily.   Yes Historical Provider, MD  cyclobenzaprine (AMRIX) 15 MG 24 hr capsule Take 1 capsule (15 mg total) by mouth daily as needed for muscle spasms. 02/08/16  Yes Terrin Imparato, PA-C  escitalopram (LEXAPRO) 10 MG tablet Take 1 tablet (10 mg total) by mouth daily. 03/30/16  Yes [name] Choy, PA-C    HYDROcodone-acetaminophen (NORCO/VICODIN) 5-325 MG tablet Take 1 tablet by mouth every 8 (eight) hours as needed for severe pain. 03/30/16  Yes Layne Dilauro, PA-C  lidocaine (LIDODERM) 5 % Place 1 patch onto the skin daily. Remove & Discard patch within 12 hours or as directed by MD 03/03/16  Yes Scotland Dost, PA-C  MELATONIN PO Take by mouth daily.   Yes Historical Provider, MD  Multiple Vitamin (MULTIVITAMIN) tablet Take 1 tablet by mouth daily.   Yes Historical Provider, MD  pregabalin (LYRICA) 200 MG capsule Take 1 capsule (200 mg total) by mouth 3 (three) times daily. 03/03/16  Yes Andranik Jeune, PA-C     No Known Allergies     Objective:  Physical Exam  Constitutional: She is oriented to person, place, and time. She appears well-developed and well-nourished. She is active and cooperative. No distress.  BP 110/74   Pulse 90   Temp 98.3 F (36.8 C) (Oral)   Resp 16   Ht 5' 7.5" (1.715 m)   Wt 223 lb 12.8 oz (101.5 kg)   SpO2 97%   BMI 34.53 kg/m    Eyes: Conjunctivae are normal.  Pulmonary/Chest: Effort normal.  Neurological: She is alert and oriented to person, place, and time.  Psychiatric: She has a normal mood and affect. Her  speech is normal and behavior is normal.           Assessment & Plan:   1. Depression, unspecified depression type INCREASE escitalopram to 15 mg then to 20 mg. Expect improvement once her pain is under better control. She elects re-evaluation in 3 months. I encouraged sooner if needed. - escitalopram (LEXAPRO) 20 MG tablet; Take 1 tablet (20 mg total) by mouth daily.  Dispense: 30 tablet; Refill: 3   Fara Chute, PA-C Physician Assistant-Certified [facility]

## 2016-04-11 NOTE — Telephone Encounter (Signed)
Records and forms have already been faxed to [facility] for this patient, if there is someone else that is requesting records then I have not gotten anything.

## 2016-04-11 NOTE — Telephone Encounter (Signed)
Pt stated that her insurance from disability is requesting medical records for her. She wanted to know if we have received that information. Pt was last seen on 03/30/16 in regards to this.  Please advise  [phone]

## 2016-04-12 ENCOUNTER — Ambulatory Visit (INDEPENDENT_AMBULATORY_CARE_PROVIDER_SITE_OTHER): Payer: 59 | Admitting: Orthopaedic Surgery

## 2016-04-12 ENCOUNTER — Encounter (INDEPENDENT_AMBULATORY_CARE_PROVIDER_SITE_OTHER): Payer: Self-pay | Admitting: Orthopaedic Surgery

## 2016-04-12 VITALS — BP 137/87 | HR 83 | Ht 67.0 in | Wt 223.0 lb

## 2016-04-12 DIAGNOSIS — M545 Low back pain: Secondary | ICD-10-CM

## 2016-04-12 DIAGNOSIS — G8929 Other chronic pain: Secondary | ICD-10-CM | POA: Diagnosis not present

## 2016-04-12 NOTE — Progress Notes (Signed)
Office Visit Note   [name]           Date of Birth: 1965/12/05           MRN: [id number] Visit Date: 04/12/2016              Requested by: Sarah Mons, PA-C [address] [place], [place] [address] PCP: Sarah Mons, PA-C   Assessment & Plan: Visit Diagnoses:  1. Chronic bilateral low back pain without sciatica     Plan: Patient started on anti-inflammatories taking narcotic medication for many months Norco 5/325. She's had 2 months physical therapy without improvement. I recommend proceeding with the lumbar MRI with and without contrast to rule out recurrent disc herniation for her uncontrolled pain. Findings on her exam are equivocal  Follow-Up Instructions: Return in about 2 weeks (around 04/26/2016).   Orders:  No orders of the defined types were placed in this encounter.  No orders of the defined types were placed in this encounter.     Procedures: No procedures performed   Clinical Data: No additional findings.   Subjective: No chief complaint on file.   Patient presents with low back pain. She states that she has had previous lumbar spine surgery February 2017 by Dr. Christia Reading in [facility]. Prior to the surgery, she had sciatica, but that went away. Patient states the the low back pain has not gone away since the surgery. She states the pain is in the middle part of her low back. She has had previous x-rays which are on [place].  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 She hasn't worked in over a year. Patient had epidural prior to her surgery after surgery she continued to have problems with back pain states her leg pain was better but she still has pain all across lumbosacral junction. She states she is in the process of applying for disability but has not been granted. She denies bowel or bladder symptoms no fever chills. Patient is the here with her wife who works for [employer]. Patient states  she has increased pain with the sitting can't walk for a far activity makes it worse she's been through 2 months physical therapy without improvement.  Review of Systems  Constitutional: Negative for chills and diaphoresis.  HENT: Negative for ear discharge, ear pain and nosebleeds.   Eyes: Negative for discharge and visual disturbance.  Respiratory: Negative for cough, choking and shortness of breath.   Cardiovascular: Negative for chest pain and palpitations.  Gastrointestinal: Negative for abdominal distention and abdominal pain.  Endocrine: Negative for cold intolerance and heat intolerance.  Genitourinary: Negative for flank pain and hematuria.  Skin: Negative for rash and wound.  Neurological: Negative for seizures and speech difficulty.  Hematological: Negative for adenopathy. Does not bruise/bleed easily.  Psychiatric/Behavioral: Negative for agitation and suicidal ideas.       History of depression     Objective: Vital Signs: BP 137/87   Pulse 83   Ht 5\' 7"  (1.702 m)   Wt 223 lb (101.2 kg)   BMI 34.93 kg/m   Physical Exam  Constitutional: She is oriented to person, place, and time. She appears well-developed.  HENT:  Head: Normocephalic.  Right Ear: External ear normal.  Left Ear: External ear normal.  Eyes: Pupils are equal, round, and reactive to light.  Neck: No tracheal deviation present. No thyromegaly present.  Cardiovascular: Normal rate.   Pulmonary/Chest: Effort normal.  Abdominal: Soft.  Musculoskeletal:  Patient gets from sitting to standing using her hands. She has the short stride gait with the "walking on egg shell) pattern. She gets on and off the exam table. With the logroll test she complains of pain no restriction in internal/external rotation right and left leg. Describes her tightens her quad with the straight leg raising. Chest static not tenderness bilaterally. There is incision right off the midline that's the 2 cm in size from her previous  surgery. Knee jerk is 2+. She has trace ankle jerk on the right 2+ on the left. Anterior tib EHL is active with encouragement.  Neurological: She is alert and oriented to person, place, and time.  Skin: Skin is warm and dry.  Psychiatric: She has a normal mood and affect. Her behavior is normal.    Ortho Exam pressure exposed skin no lymphadenopathy. Hip internal/external rotation is normal. She complains of pain in the back with straight leg raising bilaterally but no radicular component. No quad or hamstring atrophy. She has some trembling when she ambulates involving most of the lower extremities. Short stride gait balance is poor she reaches and grabs the table or chair she walks by.  Specialty Comments:  No specialty comments available.  Imaging: No results found.   PMFS History: Patient Active Problem List   Diagnosis Date Noted  . BMI 34.0-34.9,adult 11/25/2015  . Depression 11/23/2015  . 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 Chronic lower back pain 06/18/2015   Past Medical History:  Diagnosis Date  . Anemia   . Fibroids   . 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 Diabetes Mother   . Hypertension Mother   . Diabetes Brother   . Hypertension Brother   . Cancer Father   . Endometriosis Daughter   . Diabetes Daughter   . HIV Son     Past Surgical History:  Procedure Laterality Date  . ABDOMINAL HYSTERECTOMY  2007  . CESAREAN SECTION  11/19/1989  . 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 SPINE SURGERY  07/13/2015   Dr. Christia Reading  . TUBAL LIGATION  05/09/1993   Social History   Occupational History  . [employer] SAL[employer]     out of work since 03/2015   Social History Main Topics  . Smoking status: Former Smoker    Years: 1.00    Types: Cigarettes  . Smokeless tobacco: Never Used  . Alcohol use 0.6 oz/week    1 Standard drinks or equivalent per week     Comment: social  . Drug use: No  .  Sexual activity: Yes    Partners: Female

## 2016-04-26 MED FILL — ESCITALOPRAM 20 MG TABLET: 20 | 30 days supply | Qty: 30 | Fill #0

## 2016-04-30 ENCOUNTER — Ambulatory Visit
Admission: RE | Admit: 2016-04-30 | Discharge: 2016-04-30 | Disposition: A | Discharge status: Home / Self Care | Payer: BLUE CROSS/BLUE SHIELD | Source: Ambulatory Visit | Attending: Orthopaedic Surgery | Admitting: Orthopaedic Surgery

## 2016-04-30 DIAGNOSIS — M545 Low back pain, unspecified: Secondary | ICD-10-CM

## 2016-04-30 DIAGNOSIS — M5126 Other intervertebral disc displacement, lumbar region: Secondary | ICD-10-CM | POA: Diagnosis not present

## 2016-04-30 DIAGNOSIS — G8929 Other chronic pain: Secondary | ICD-10-CM

## 2016-04-30 MED ORDER — GADOBENATE DIMEGLUMINE 529 MG/ML IV SOLN
20.0000 mL | Freq: Once | INTRAVENOUS | Status: AC | PRN
  Administered 2016-04-30: 20 mL via INTRAVENOUS

## 2016-05-03 ENCOUNTER — Encounter (INDEPENDENT_AMBULATORY_CARE_PROVIDER_SITE_OTHER): Payer: Self-pay | Admitting: Orthopaedic Surgery

## 2016-05-03 ENCOUNTER — Ambulatory Visit (INDEPENDENT_AMBULATORY_CARE_PROVIDER_SITE_OTHER): Payer: 59 | Admitting: Orthopaedic Surgery

## 2016-05-03 VITALS — BP 136/87 | HR 82 | Ht 67.5 in | Wt 225.0 lb

## 2016-05-03 DIAGNOSIS — M545 Low back pain, unspecified: Secondary | ICD-10-CM

## 2016-05-03 DIAGNOSIS — G8929 Other chronic pain: Secondary | ICD-10-CM | POA: Diagnosis not present

## 2016-05-03 NOTE — Progress Notes (Signed)
Office Visit Note   [name]           Date of Birth: 1965-12-25           MRN: [id number] Visit Date: 05/03/2016              Requested by: Harrison Mons, PA-C [address] [place], [place] [address] PCP: Harrison Mons, PA-C   Assessment & Plan: Visit Diagnoses:  1. Chronic bilateral low back pain without sciatica     Plan: MRI scan is reviewed with patient and also her wife is present with her. Patient has the disc degenerative changes with no evidence of recurrent disc. Normal scar tissue is present. She has in the plate edema with Modic changes. We discussed a walking plan gradually working up to 2 miles a day avoiding repetitive bending and stooping and twisting until her symptoms settle down. She understands as this will take sometimes up to a year to get resolution of the endplate degenerative changes. Activities and avoid repetitive bending twisting stooping or climbing is recommended as well as a walking program. Flexeril Lyrica and lidocaine patches which she is already on may give her some relief. I discussed with her that would be best avoided narcotic medication since his sometimes takes several months to have the endplate edema changes resolved. Plan recheck her in 6 months. She understands that with endplate degeneration she'll have some back pain for. Of months and that at this time no surgery is indicated. She'll work on a walking program weight loss  And core strengthening .  Follow-Up Instructions: Return in about 6 months (around 11/01/2016).   Orders:  No orders of the defined types were placed in this encounter.  No orders of the defined types were placed in this encounter.     Procedures: No procedures performed   Clinical Data: No additional findings.   Subjective: Chief Complaint  Patient presents with  . Lower Back - Pain, Follow-up    Sarah Duncan is here a 2 week rov for her back pain. She states that he  symptoms are the same.  Patient previously had surgery at [facility] in [place].Feb. 2017.  Review of Systems 14 point review of systems updated from 04/10/2016 and is unchanged.   Objective: Vital Signs: BP 136/87 (BP Location: Left Arm, Patient Position: Sitting)   Pulse 82   Ht 5' 7.5" (1.715 m)   Wt 225 lb (102.1 kg)   BMI 34.72 kg/m   Physical Exam patient's alert and oriented X Dr. movements intact audible wheezing no rash of her exposed skin. She still has short stride gait. Negative logroll knee reached full extension. Reflexes are intact anterior tib gastrocsoleus is active. No lymphedema.  Ortho Exam patient has absent radicular component to her pain with straight leg raising. Still has axial back pain. She does not grab the table today when she walks in the room.  Specialty Comments:  No specialty comments available.  Imaging: No results found. PACS Images   Show images for MR Lumbar Spine W Wo Contrast  Study Result   CLINICAL DATA:  Central low back pain for greater than 1 year. Lumbar surgery in February 2017.  EXAM: MRI LUMBAR SPINE WITHOUT AND WITH CONTRAST  TECHNIQUE: Multiplanar and multiecho pulse sequences of the lumbar spine were obtained without and with intravenous contrast.  CONTRAST:  42mL MULTIHANCE GADOBENATE DIMEGLUMINE 529 MG/ML IV SOLN  COMPARISON:  03/03/2016 radiographs  FINDINGS: Segmentation: Correlation with conventional radiography reveals that the  transitional lumbosacral vertebra is L5.  Alignment:  3 mm degenerative retrolisthesis at L3-4.  Vertebrae:  Type 2 degenerative endplate findings at 075-GRM  Conus medullaris: Extends to the T12 level and appears normal. No abnormal nerve root clumping or enhancement to suggest arachnoiditis.  Paraspinal and other soft tissues: Absent left kidney, prior port the patient donated the kidney.  Disc levels:  T10-11: Mild bilateral foraminal stenosis due to biforaminal  disc protrusions. This level is only included on the parasagittal images.  T11-12: Unremarkable. This level is only included on the parasagittal images.  T12-L1:  Unremarkable.  L1-2:  Unremarkable.  L2-3:  No impingement.  Mild disc bulge.  L3-4: Mild left and borderline right foraminal stenosis and mild central narrowing of the thecal sac due to disc bulge, facet arthropathy, and minimal intervertebral spurring.  L4-5: Mild to moderate left and mild right foraminal stenosis due to right greater than left facet arthropathy. Enhancement in the right lateral recess attributed to epidural fibrosis which surrounds the right L5 nerve roots without obvious impingement. Prior right laminectomy.  L5-S1: No impingement.  Transitional morphology.  IMPRESSION: 1. Lumbar spondylosis and degenerative disc disease with mild to moderate impingement at L4-5 and mild impingement at L3-4 as detailed above. 2. On balance, I do not believe that there is significant impingement in the right lateral recess at the L4-5 level. There is epidural fibrosis in this vicinity lining the L5 nerve roots but I do not believe there is a recurrent disc fragment in this area. 3. Transitional L5 vertebra.   Electronically Signed   By: Van Clines M.D.   On: 04/30/2016 15:50     PMFS History: Patient Active Problem List   Diagnosis Date Noted  . BMI 34.0-34.9,adult 11/25/2015  . Depression 11/23/2015  . 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 Chronic lower back pain 06/18/2015   Past Medical History:  Diagnosis Date  . Anemia   . Fibroids   . 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 Diabetes Mother   . Hypertension Mother   . Diabetes Brother   . Hypertension Brother   . Cancer Father   . Endometriosis Daughter   . Diabetes Daughter   . HIV Son     Past Surgical History:  Procedure Laterality Date  . ABDOMINAL  HYSTERECTOMY  2007  . CESAREAN SECTION  11/19/1989  . 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 SPINE SURGERY  07/13/2015   Dr. Christia Reading  . TUBAL LIGATION  05/09/1993   Social History   Occupational History  . [employer]     out of work since 03/2015   Social History Main Topics  . Smoking status: Former Smoker    Years: 1.00    Types: Cigarettes  . Smokeless tobacco: Never Used  . Alcohol use 0.6 oz/week    1 Standard drinks or equivalent per week     Comment: social  . Drug use: No  . Sexual activity: Yes    Partners: Female

## 2016-05-23 ENCOUNTER — Encounter: Payer: Self-pay | Admitting: Physician Assistant

## 2016-05-23 ENCOUNTER — Ambulatory Visit (INDEPENDENT_AMBULATORY_CARE_PROVIDER_SITE_OTHER): Payer: 59 | Admitting: Physician Assistant

## 2016-05-23 VITALS — BP 134/85 | HR 90 | Temp 98.0°F | Resp 16 | Ht 67.5 in | Wt 224.0 lb

## 2016-05-23 DIAGNOSIS — F32A Depression, unspecified: Secondary | ICD-10-CM

## 2016-05-23 DIAGNOSIS — G8929 Other chronic pain: Secondary | ICD-10-CM

## 2016-05-23 DIAGNOSIS — M545 Low back pain: Secondary | ICD-10-CM | POA: Diagnosis not present

## 2016-05-23 DIAGNOSIS — F329 Major depressive disorder, single episode, unspecified: Secondary | ICD-10-CM

## 2016-05-23 MED ORDER — CYCLOBENZAPRINE HCL ER 15 MG PO CP24
15.0000 mg | ORAL_CAPSULE | Freq: Every day | ORAL | 1 refills | Status: DC | PRN
Start: 2016-05-23 — End: 2016-06-05

## 2016-05-23 NOTE — Patient Instructions (Addendum)
Call the number on the back of your insurance card for mental health. Find out who is in network for you and call to schedule an appointment.  Join the [employer] or a local Y with a pool. Sign up for a water aerobics class!    IF you received an x-ray today, you will receive an invoice from [facility]. Please contact [facility] at [phone] with questions or concerns regarding your invoice.   IF you received labwork today, you will receive an invoice from [place]. Please contact [facility] at [phone] with questions or concerns regarding your invoice.   Our billing staff will not be able to assist you with questions regarding bills from these companies.  You will be contacted with the lab results as soon as they are available. The fastest way to get your results is to activate your My Chart account. Instructions are located on the last page of this paperwork. If you have not heard from [place] regarding the results in 2 weeks, please contact this office.

## 2016-05-23 NOTE — Progress Notes (Signed)
Patient ID: Sarah Duncan, female    DOB: 07/01/1965, 51 y.o.   MRN: [id number]  PCP: Harrison Mons, PA-C  Chief Complaint  Patient presents with  . Follow-up    back pain, Norco not helping, need refill on Amrix    Subjective:   Presents for evaluation of back pain. Accompanied by her wife, also my patient. Since her last visit with me in 03/2016, she has seen Dr. Lorin Mercy x 2. She tells me she has DDD and new bulging disc and epidural fibrosis. She reports he has advised her to seek disability, and advises me to anticipate receipt of the papers.  From Dr. Lorin Mercy note 05/03/2016: MRI scan is reviewed with patient and also her wife is present with her. Patient has the disc degenerative changes with no evidence of recurrent disc. Normal scar tissue is present. She has in the plate edema with Modic changes. We discussed a walking plan gradually working up to 2 miles a day avoiding repetitive bending and stooping and twisting until her symptoms settle down. She understands as this will take sometimes up to a year to get resolution of the endplate degenerative changes. Activities and avoid repetitive bending twisting stooping or climbing is recommended as well as a walking program. Flexeril Lyrica and lidocaine patches which she is already on may give her some relief. I discussed with her that would be best avoided narcotic medication since his sometimes takes several months to have the endplate edema changes resolved. Plan recheck her in 6 months. She understands that with endplate degeneration she'll have some back pain for. Of months and that at this time no surgery is indicated. She'll work on a walking program weight loss  And core strengthening .  Follow-Up Instructions: Return in about 6 months (around 11/01/2016).   MRI 06/07/2015 The vertebral body heights and alignments are satisfactory and the vertebral bodies maintain their normal marrow signals with discogenic changes  in the endplates at W1-U2.The conus appears normal and ends at the T12 level. From T12-L1 through L2-L3, no significant canal or neural foraminal stenosis is demonstrated. At L3-L4, a broad-based disc bulges demonstrated.It is slightly more prominent towards the right.It causes a mild impression on the right lateral recess. At L4-L5, a large right-sided disc herniation is demonstrated.And measures approximately 1.3 cm in AP diameter and causes compression of the right lateral recess.Moderate canal stenosis demonstrated.Some inferior extension of the herniation evident.Mild right foraminal stenosis. Moderate left foraminal stenosis contributed to by posterior element hypertrophy.Contact of the right L5 nerve root before it enters its foramen demonstrated. At L5-S1, mild disc bulge evident without significant canal or foraminal stenosis. IMPRESSION Large right-sided disc herniation at L4-L5 with significant impression on the right lateral recess.Left-sided foraminal stenosis evident at this level as well.  Surgery performed 07/13/2015, operative report not available in Care Everywhere  09/06/2015 visit with Dr. Christia Reading. The patient is released from my care after today's visit following uncomplicated microdiskectomy. Unfortunately she continues to complain of lower back pain with any activity. She states that she feels she is unable to return to work. She states that she is unable to attend physical therapy because she does not have coverage for it and can't afford the co-pay. I did give her a prescription for gabapentin which she may use. She requested a note to remain out of work. Of course she may do as she feels most appropriate but I can't enforce any particular restrictions on her activity as she is recovered  from her surgery. I instructed our staff to give her a note that says that she was at our office for appointment today and that the patient states she is unable to  work secondary to lower back pain. I did take the time to explain to Ms. Mac entire that we expect her back symptoms to improve with time and stretching. I also explained that I think it is doubtful that she would receive disability for the current set of circumstances.  Reports her anxiety/depression are improved from last visit, "about a 5/10."  Review of Systems Depressed, despite the scoring on screening today. Back pain. No chest pain, SOB, HA, dizziness, vision change, N/V, diarrhea, constipation, dysuria, urinary urgency or frequency, new myalgias, new arthralgias or rash.   Depression screen [facility] 2/9 05/23/2016 04/11/2016 03/30/2016 03/03/2016 11/25/2015  Decreased Interest 0 3 0 3 0  Down, Depressed, Hopeless 0 _0 0  PHQ - 2 Score 0 _1 0  Altered sleeping - _2 -  Tired, decreased energy - _3 -  Change in appetite - 3 0 2 -  Feeling bad or failure about yourself  - _4 -  Trouble concentrating - _5 -  Moving slowly or fidgety/restless - 2 3 0 -  Suicidal thoughts - 0 0 0 -  PHQ-9 Score - _6 -  Difficult doing work/chores - Very difficult Very difficult - -     Patient Active Problem List   Diagnosis Date Noted  . BMI 34.0-34.9,adult 11/25/2015  . Depression 11/23/2015  . 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 Chronic lower back pain 06/18/2015     Prior to Admission medications   Medication Sig Start Date End Date Taking? Authorizing Provider  acetaminophen (TYLENOL) 650 MG CR tablet Take 650 mg by mouth every 8 (eight) hours as needed for pain.   Yes Historical Provider, MD  BIOTIN PO Take by mouth daily.   Yes Historical Provider, MD  cyclobenzaprine (AMRIX) 15 MG 24 hr capsule Take 1 capsule (15 mg total) by mouth daily as needed for muscle spasms. 02/08/16  Yes Rejeana Fadness, PA-C  escitalopram (LEXAPRO) 20 MG tablet Take 1 tablet (20 mg total) by mouth daily. 04/11/16  Yes Rajiv Parlato, PA-C  HYDROcodone-acetaminophen (NORCO/VICODIN) 5-325 MG tablet Take  1 tablet by mouth every 8 (eight) hours as needed for severe pain. 03/30/16  Yes Sussan Meter, PA-C  lidocaine (LIDODERM) 5 % Place 1 patch onto the skin daily. Remove & Discard patch within 12 hours or as directed by MD 03/03/16  Yes Reese Senk, PA-C  MELATONIN PO Take by mouth daily.   Yes Historical Provider, MD  Multiple Vitamin (MULTIVITAMIN) tablet Take 1 tablet by mouth daily.   Yes Historical Provider, MD  pregabalin (LYRICA) 200 MG capsule Take 1 capsule (200 mg total) by mouth 3 (three) times daily. 03/03/16  Yes Eliya Bubar, PA-C     No Known Allergies     Objective:  Physical Exam  Constitutional: She is oriented to person, place, and time. She appears well-developed and well-nourished. She is active and cooperative. No distress.  BP 134/85 (BP Location: Right Arm, Patient Position: Sitting, Cuff Size: Small)   Pulse 90   Temp 98 F (36.7 C) (Oral)   Resp 16   Ht 5' 7.5" (1.715 m)   Wt 224 lb (101.6 kg)   SpO2 100%   BMI 34.57 kg/m   HENT:  Head: Normocephalic and atraumatic.  Right Ear: Hearing normal.  Left Ear: Hearing normal.  Eyes: Conjunctivae are normal. No scleral icterus.  Neck: Normal range of motion. Neck supple. No thyromegaly present.  Cardiovascular: Normal rate, regular rhythm and normal heart sounds.   Pulses:      Radial pulses are 2+ on the right side, and 2+ on the left side.  Pulmonary/Chest: Effort normal and breath sounds normal.  Musculoskeletal:       Thoracic back: She exhibits decreased range of motion, tenderness, bony tenderness, pain and spasm.       Lumbar back: She exhibits decreased range of motion, tenderness, bony tenderness, pain and spasm.  Lymphadenopathy:       Head (right side): No tonsillar, no preauricular, no posterior auricular and no occipital adenopathy present.       Head (left side): No tonsillar, no preauricular, no posterior auricular and no occipital adenopathy present.    She has no cervical adenopathy.        Right: No supraclavicular adenopathy present.       Left: No supraclavicular adenopathy present.  Neurological: She is alert and oriented to person, place, and time. No sensory deficit.  Skin: Skin is warm, dry and intact. No rash noted. No cyanosis or erythema. Nails show no clubbing.  Psychiatric: She has a normal mood and affect. Her speech is normal and behavior is normal.           Assessment & Plan:   1. Chronic low back pain without sciatica, unspecified back pain laterality Continue per Dr. Lorin Mercy. Needs refill of Amrix. Encourage her to consider water exercise. - cyclobenzaprine (AMRIX) 15 MG 24 hr capsule; Take 1 capsule (15 mg total) by mouth daily as needed for muscle spasms.  Dispense: 90 capsule; Refill: 1  2. Depression, unspecified depression type Significantly improved, but not resolved. No SI. Continue current treatment with Lexapro. Encouraged her to schedule with a therapist.    Fara Chute, PA-C Physician Assistant-Certified [facility]

## 2016-05-24 DIAGNOSIS — H52223 Regular astigmatism, bilateral: Secondary | ICD-10-CM | POA: Diagnosis not present

## 2016-05-25 ENCOUNTER — Encounter: Payer: Self-pay | Admitting: Physician Assistant

## 2016-05-27 NOTE — Telephone Encounter (Signed)
Patient reports that her insurance no longer covers Amrix.  Is there a prior auth or something that we can pursue?

## 2016-06-05 ENCOUNTER — Telehealth: Payer: Self-pay

## 2016-06-05 MED ORDER — CYCLOBENZAPRINE HCL 10 MG PO TABS: 5.0000 mg | ORAL_TABLET | Freq: Three times a day (TID) | ORAL | 0 refills | Status: DC | PRN

## 2016-06-05 MED FILL — CYCLOBENZAPRINE 10 MG TAB: 10 | 30 days supply | Qty: 90 | Fill #0

## 2016-06-05 NOTE — Telephone Encounter (Signed)
amirex is not covered Flexeril 5mg  or 10mg  is covered  Please change and send if appropriate

## 2016-06-05 NOTE — Telephone Encounter (Signed)
Meds ordered this encounter  Medications  . cyclobenzaprine (FLEXERIL) 10 MG tablet    Sig: Take 0.5-1 tablets (5-10 mg total) by mouth 3 (three) times daily as needed for muscle spasms.    Dispense:  90 tablet    Refill:  0    Order Specific Question:   Supervising Provider    Answer:   Brigitte Pulse, EVA N [[id number]]

## 2016-06-06 ENCOUNTER — Encounter: Payer: Self-pay | Admitting: Physician Assistant

## 2016-07-04 MED FILL — ESCITALOPRAM 20 MG TABLET: 20 | 30 days supply | Qty: 30 | Fill #1

## 2016-07-18 ENCOUNTER — Ambulatory Visit (INDEPENDENT_AMBULATORY_CARE_PROVIDER_SITE_OTHER): Payer: 59 | Admitting: Physician Assistant

## 2016-07-18 ENCOUNTER — Encounter: Payer: Self-pay | Admitting: Physician Assistant

## 2016-07-18 VITALS — BP 136/82 | HR 100 | Temp 98.3°F | Resp 18 | Ht 67.5 in | Wt 224.0 lb

## 2016-07-18 DIAGNOSIS — M545 Low back pain: Secondary | ICD-10-CM

## 2016-07-18 DIAGNOSIS — Z6834 Body mass index (BMI) 34.0-34.9, adult: Secondary | ICD-10-CM

## 2016-07-18 DIAGNOSIS — G8929 Other chronic pain: Secondary | ICD-10-CM

## 2016-07-18 DIAGNOSIS — Z905 Acquired absence of kidney: Secondary | ICD-10-CM

## 2016-07-18 DIAGNOSIS — R002 Palpitations: Secondary | ICD-10-CM

## 2016-07-18 DIAGNOSIS — F329 Major depressive disorder, single episode, unspecified: Secondary | ICD-10-CM

## 2016-07-18 DIAGNOSIS — F32A Depression, unspecified: Secondary | ICD-10-CM

## 2016-07-18 MED ORDER — PREGABALIN 200 MG PO CAPS: 200.0000 mg | ORAL_CAPSULE | Freq: Three times a day (TID) | ORAL | 0 refills | Status: DC

## 2016-07-18 NOTE — Progress Notes (Signed)
Patient ID: Sarah Duncan, female    DOB: [birth year], 51 y.o.   MRN: [id number]  PCP: Harrison Mons, PA-C  Chief Complaint  Patient presents with  . Follow-up    BACK PAIN,CHECK KIDNEY FUNCTION, FILL OUT FORM FOR INSURANCE  . Medication Refill    LYRICA    Subjective:   Presents for evaluation with her wife, for her chronic low back pain and for disability paperwork renewal. Pt had a back injury 04/12/2015 which put her on disability. She has an orthopedist, Dr. Lorin Mercy. She has also done Physical therapy (10/2015) although limited in duration and eventually discontinued due to pain. Below is Dr. Lorin Mercy note from her visit discussing his pain management plan and follow up.     Dr. Lorin Mercy 05/03/2016 Plan: MRI scan is reviewed with patient and also her wife is present with her. Patient has the disc degenerative changes with no evidence of recurrent disc. Normal scar tissue is present. She has in the plate edema with Modic changes. We discussed a walking plan gradually working up to 2 miles a day avoiding repetitive bending and stooping and twisting until her symptoms settle down. She understands as this will take sometimes up to a year to get resolution of the endplate degenerative changes. Activities and avoid repetitive bending twisting stooping or climbing is recommended as well as a walking program. Flexeril Lyrica and lidocaine patches which she is already on may give her some relief. I discussed with her that would be best avoided narcotic medication since his sometimes takes several months to have the endplate edema changes resolved. Plan recheck her in 6 months. She understands that with endplate degeneration she'll have some back pain for. Of months and that at this time no surgery is indicated. She'll work on a walking program weight loss  And core strengthening .  Pt also states she is having more frequent episodes of heart palpitations and wasn't sure if it was  Due to  medication or anxiety. She has had these all her life and her doctor (PCP) 10+ years ago "mentioned a pacemaker" but nothing was ever discussed further.    Review of Systems  Constitutional: Negative.   HENT: Negative.   Eyes: Negative.   Respiratory: Negative.  Negative for shortness of breath.   Cardiovascular: Positive for palpitations. Negative for chest pain and leg swelling.  Gastrointestinal: Negative.   Endocrine: Negative.   Genitourinary: Negative.   Musculoskeletal: Positive for back pain. Negative for joint swelling, myalgias, neck pain and neck stiffness.  Skin: Negative.   Allergic/Immunologic: Negative.   Neurological: Negative.   Hematological: Negative.   Psychiatric/Behavioral: Negative.   All other systems reviewed and are negative.      Patient Active Problem List   Diagnosis Date Noted  . BMI 34.0-34.9,adult 11/25/2015  . Depression 11/23/2015  . 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 Chronic lower back pain 06/18/2015     Prior to Admission medications   Medication Sig Start Date End Date Taking? Authorizing Provider  acetaminophen (TYLENOL) 650 MG CR tablet Take 650 mg by mouth every 8 (eight) hours as needed for pain.   Yes Historical Provider, MD  BIOTIN PO Take by mouth daily.   Yes Historical Provider, MD  cyclobenzaprine (FLEXERIL) 10 MG tablet Take 0.5-1 tablets (5-10 mg total) by mouth 3 (three) times daily as needed for muscle spasms. 06/05/16  Yes Chelle Jeffery, PA-C  escitalopram (LEXAPRO) 20 MG tablet Take 1 tablet (20 mg total) by mouth daily.  04/11/16  Yes Chelle Jeffery, PA-C  lidocaine (LIDODERM) 5 % Place 1 patch onto the skin daily. Remove & Discard patch within 12 hours or as directed by MD 03/03/16  Yes Chelle Jeffery, PA-C  MELATONIN PO Take by mouth daily.   Yes Historical Provider, MD  Multiple Vitamin (MULTIVITAMIN) tablet Take 1 tablet by mouth daily.   Yes Historical Provider, MD  pregabalin (LYRICA) 200 MG capsule Take 1 capsule (200 mg  total) by mouth 3 (three) times daily. 03/03/16  Yes Chelle Jeffery, PA-C     No Known Allergies     Objective:  Physical Exam  Constitutional: She is oriented to person, place, and time. Vital signs are normal. She appears well-developed and well-nourished. She is active.  Blood pressure 136/82, pulse 100, temperature 98.3 F (36.8 C), temperature source Oral, resp. rate 18, height 5' 7.5" (1.715 m), weight 224 lb (101.6 kg), SpO2 100 %.  Eyes: Conjunctivae are normal. Pupils are equal, round, and reactive to light.  Cardiovascular: Normal rate, regular rhythm, S1 normal, S2 normal, normal heart sounds and normal pulses.  Exam reveals no gallop.   No murmur heard. Pulmonary/Chest: Effort normal and breath sounds normal.  Musculoskeletal: She exhibits tenderness. She exhibits no edema or deformity.       Lumbar back: She exhibits decreased range of motion, tenderness, laceration (Surgcial scar, healed) and pain. She exhibits no bony tenderness, no swelling, no edema, no deformity, no spasm and normal pulse.       Back:  Lymphadenopathy:    She has no cervical adenopathy.    She has no axillary adenopathy.  Neurological: She is alert and oriented to person, place, and time.  Skin: Skin is warm and dry. No rash noted.  Psychiatric: She has a normal mood and affect. Her speech is normal and behavior is normal. Judgment and thought content normal. Cognition and memory are normal.       Assessment & Plan:  1. Chronic bilateral low back pain without sciatica Discussed continued core strength and follow up with [facility]. - pregabalin (LYRICA) 200 MG capsule; Take 1 capsule (200 mg total) by mouth 3 (three) times daily.  Dispense: 90 capsule; Refill: 0  2. Depression, unspecified depression type Continue medications for anxiety & depression  3. Palpitations Has had them in the past but more frequently since back pain. - EKG 12-Lead - Ambulatory referral to Cardiology  4. BMI  34.0-34.9,adult - Amb Ref to Medical Weight Management  5. Single kidney Continue to assess kidney function annually

## 2016-07-18 NOTE — Patient Instructions (Addendum)
Contact Dr. Lorin Mercy office to let him know that your pain isn't adequately controlled and that you are ready to try physical therapy again. He may order the PT by phone, but he will likely need to see you again to address the pain.    IF you received an x-ray today, you will receive an invoice from [facility]. Please contact [facility] at [phone] with questions or concerns regarding your invoice.   IF you received labwork today, you will receive an invoice from [facility]. Please contact [facility] at [phone] with questions or concerns regarding your invoice.   Our billing staff will not be able to assist you with questions regarding bills from these companies.  You will be contacted with the lab results as soon as they are available. The fastest way to get your results is to activate your My Chart account. Instructions are located on the last page of this paperwork. If you have not heard from [place] regarding the results in 2 weeks, please contact this office.

## 2016-07-21 MED FILL — LYRICA 200 MG CAPSULE: 200 | 30 days supply | Qty: 90 | Fill #0

## 2016-07-23 NOTE — Progress Notes (Signed)
Cardiology Office Note    Date:  07/24/2016   ID:  Sarah Duncan, DOB Dec 07, 1965, MRN [id number]  PCP:  Harrison Mons, PA-C  Cardiologist: New to [facility] - Dr. Stanford Breed  Chief Complaint  Patient presents with  . New Evaluation    pt c/o Palpitations    History of Present Illness:    Sarah Duncan is a 51 y.o. female with past medical history of DJD and single kidney (following donation) who presents to the office today as a new patient referral for evaluation of palpitations.   Was seen by her PCP on 07/18/2016 and reported having more frequent palpitations, reporting a history of these for many years. EKG obtained at that time showed NSR, HR 78, with no acute ST or T-wave changes. Cardiology referral was recommended.  In talking with the patient today, she reports having palpitations initially occurring 10+ years ago. Says she was seen by a Cardiologist at that time and a "pacemaker" was mentioned. She never went back for follow-up.  Starting approximately one year ago, she developed recurrent palpitations. She reports these occur while at rest or with activity and last for up to 1 minute. Occur 3-4 times per day. She denies any associated chest discomfort, dyspnea, headaches, lightheadedness, presyncope, syncope, nausea, or vomiting. She does not consume caffeine. Drinks 1-2 glasses of wine per week and denies any increase in the frequency of her palpitations with alcohol consumption.  She denies any prior cardiac history, known HTN, HLD, or Type 2 DM. No family history of CAD. Does report her mother and brother have HTN and Type 2 DM. No history of tobacco use or recreational drug use.    Past Medical History:  Diagnosis Date  . Anemia   . Fibroids   . 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 ABDOMINAL HYSTERECTOMY  2007  . CESAREAN  SECTION  11/19/1989  . 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 SPINE SURGERY  07/13/2015   Dr. Christia Reading  . TUBAL LIGATION  05/09/1993    Current Medications: Outpatient Medications Prior to Visit  Medication Sig Dispense Refill  . acetaminophen (TYLENOL) 650 MG CR tablet Take 650 mg by mouth every 8 (eight) hours as needed for pain.    Marland Kitchen BIOTIN PO Take by mouth daily.    . cyclobenzaprine (FLEXERIL) 10 MG tablet Take 0.5-1 tablets (5-10 mg total) by mouth 3 (three) times daily as needed for muscle spasms. (Patient taking differently: Take 5-10 mg by mouth daily. ) 90 tablet 0  . escitalopram (LEXAPRO) 20 MG tablet Take 1 tablet (20 mg total) by mouth daily. 30 tablet 3  . MELATONIN PO Take by mouth daily.    . Multiple Vitamin (MULTIVITAMIN) tablet Take 1 tablet by mouth daily.    . pregabalin (LYRICA) 200 MG capsule Take 1 capsule (200 mg total) by mouth 3 (three) times daily. 90 capsule 0  . lidocaine (LIDODERM) 5 % Place 1 patch onto the skin daily. Remove & Discard patch within 12 hours or as directed by MD (Patient not taking: Reported on 07/24/2016) 30 patch 0   No facility-administered medications prior to visit.      Allergies:   Patient has no known allergies.   Social History   Social History  . Marital status: Married    Spouse name: Ronni Rumble  .  Number of children: 4  . Years of education: associates   Occupational History  . [employer]     out of work since 03/2015   Social History Main Topics  . Smoking status: Former Smoker    Years: 1.00    Types: Cigarettes  . Smokeless tobacco: Never Used  . Alcohol use 0.6 oz/week    1 Standard drinks or equivalent per week     Comment: social  . Drug use: No  . Sexual activity: Yes    Partners: Female   Other Topics Concern  . None   Social History Narrative   Divorced. 4 adult children (3 in [place], one in [place]).   Re-Married 05/2015. Lives with her wife.   Her wife's children live  locally.     Family History:  The patient's family history includes Cancer in her father; Diabetes in her brother, daughter, and mother; Endometriosis in her daughter; HIV in her son; Hypertension in her brother and mother.   Review of Systems:   Please see the history of present illness.     General:  No chills, fever, night sweats or weight changes.  Cardiovascular:  No chest pain, dyspnea on exertion, edema, orthopnea, paroxysmal nocturnal dyspnea. Positive for palpitations.  Dermatological: No rash, lesions/masses Respiratory: No cough, dyspnea Urologic: No hematuria, dysuria Abdominal:   No nausea, vomiting, diarrhea, bright red blood per rectum, melena, or hematemesis Neurologic:  No visual changes, wkns, changes in mental status. All other systems reviewed and are otherwise negative except as noted above.   Physical Exam:    VS:  BP 126/89 (BP Location: Left Arm, Patient Position: Sitting, Cuff Size: Normal)   Pulse 94   Ht 5' 7.5" (1.715 m)   Wt 220 lb (99.8 kg)   BMI 33.95 kg/m    General: Well developed, well nourished [place] female appearing in no acute distress. Head: Normocephalic, atraumatic, sclera non-icteric, no xanthomas, nares are without discharge.  Neck: No carotid bruits. JVD not elevated.  Lungs: Respirations regular and unlabored, without wheezes or rales.  Heart: Regular rate and rhythm. No ectopic beats appreciated. No S3 or S4.  No murmur, no rubs, or gallops appreciated. Abdomen: Soft, non-tender, non-distended with normoactive bowel sounds. No hepatomegaly. No rebound/guarding. No obvious abdominal masses. Msk:  Strength and tone appear normal for age. No joint deformities or effusions. Extremities: No clubbing or cyanosis. No edema.  Distal pedal pulses are 2+ bilaterally. Neuro: Alert and oriented X 3. Moves all extremities spontaneously. No focal deficits noted. Psych:  Responds to questions appropriately with a normal affect. Skin: No  rashes or lesions noted  Wt Readings from Last 3 Encounters:  07/24/16 220 lb (99.8 kg)  07/18/16 224 lb (101.6 kg)  05/23/16 224 lb (101.6 kg)     Studies/Labs Reviewed:   EKG:  EKG is not ordered today. EKG from 07/18/2016 reviewed which showed NSR, HR 78, with no acute ST or T-wave changes.    Recent Labs: 10/12/2015: ALT 5; BUN 10; Creat 1.01; Hemoglobin 13.7; Platelets 260; Potassium 4.1; Sodium 137; TSH 2.79   Lipid Panel    Component Value Date/Time   CHOL 195 10/12/2015 1445   TRIG 96 10/12/2015 1445   HDL 54 10/12/2015 1445   CHOLHDL 3.6 10/12/2015 1445   VLDL 19 10/12/2015 1445   LDLCALC 122 10/12/2015 1445    Assessment:    1. Palpitations   2. Single kidney   3. Chronic low back pain, unspecified back pain laterality,  with sciatica presence unspecified      Plan:   In order of problems listed above:  1. Palpitations - reports a history of palpitations initially occurring 10+ years ago. Symptoms have represented within the past year with her reporting a "fluttering" sensation along her chest and her heart racing for up to 1 minute and occurring multiple times per day. No reported associated symptoms. Denies any caffeine intake. Occasional alcohol use but palpitations are not exacerbated by this.  - EKG on 2/27 showed NSR, HR 78, with no acute ST or T-wave changes.  - will obtain a 48 hour Holter monitor to assess for any arrhythmias. Check BMET and TSH. Could consider initiation of BB therapy following monitor results but would hold off for now with her symptoms self-terminating within 1 minute. If no pathologic arrhythmias are identified, she would prefer to remain off additional medications unless absolutely necessary. This was discussed with Dr. Stanford Breed (DOD) as she is a new patient with our practice and he was in agreement with this assessment and plan.   2. Single Kidney - following kidney donation in 2009. - creatinine stable at 1.01 in 09/2015. Repeat  BMET pending.   3. Chronic Low Back Pain - currently on Flexeril and Lyrica.  - followed by PCP.    Medication Adjustments/Labs and Tests Ordered: Current medicines are reviewed at length with the patient today.  Concerns regarding medicines are outlined above.  Medication changes, Labs and Tests ordered today are listed in the Patient Instructions below.  Patient Instructions  Your physician recommends that you continue on your current medications as directed. Please refer to the Current Medication list given to you today. Your physician recommends that you return for lab work in:   BMET AND  TSH  Your physician has recommended that you wear a holter monitor. Holter monitors are medical devices that record the heart's electrical activity. Doctors most often use these monitors to diagnose arrhythmias. Arrhythmias are problems with the speed or rhythm of the heartbeat. The monitor is a small, portable device. You can wear one while you do your normal daily activities. This is usually used to diagnose what is causing palpitations/syncope (passing out).  [place]   Your physician recommends that you schedule a follow-up appointment in:  [place] DR. CRENSHAW    Signed, Erma Heritage, [place]  07/24/2016 1:25 PM    [facility] [address] [place], [place]  [address] Phone: [phone]; Fax: [phone]  [address], [place], [place] [address] Phone: [phone]

## 2016-07-24 ENCOUNTER — Encounter: Payer: Self-pay | Admitting: Student

## 2016-07-24 ENCOUNTER — Other Ambulatory Visit: Payer: Self-pay | Admitting: Student

## 2016-07-24 ENCOUNTER — Ambulatory Visit (INDEPENDENT_AMBULATORY_CARE_PROVIDER_SITE_OTHER): Payer: 59 | Admitting: Student

## 2016-07-24 VITALS — BP 126/89 | HR 94 | Ht 67.5 in | Wt 220.0 lb

## 2016-07-24 DIAGNOSIS — M545 Low back pain: Secondary | ICD-10-CM | POA: Diagnosis not present

## 2016-07-24 DIAGNOSIS — R002 Palpitations: Secondary | ICD-10-CM | POA: Diagnosis not present

## 2016-07-24 DIAGNOSIS — Z905 Acquired absence of kidney: Secondary | ICD-10-CM

## 2016-07-24 DIAGNOSIS — G8929 Other chronic pain: Secondary | ICD-10-CM

## 2016-07-24 LAB — BASIC METABOLIC PANEL
BUN: 12 mg/dL (ref 7–25)
CO2: 26 mmol/L (ref 20–31)
CREATININE: 1.1 mg/dL (ref 0.50–1.10)
Calcium: 10 mg/dL (ref 8.6–10.2)
Chloride: 103 mmol/L (ref 98–110)
GLUCOSE: 103 mg/dL — AB (ref 65–99)
POTASSIUM: 4.6 mmol/L (ref 3.5–5.3)
Sodium: 139 mmol/L (ref 135–146)

## 2016-07-24 LAB — TSH: TSH: 3.92 m[IU]/L

## 2016-07-24 NOTE — Patient Instructions (Addendum)
Your physician recommends that you continue on your current medications as directed. Please refer to the Current Medication list given to you today. Your physician recommends that you return for lab work in:   BMET AND  TSH  Your physician has recommended that you wear a holter monitor. Holter monitors are medical devices that record the heart's electrical activity. Doctors most often use these monitors to diagnose arrhythmias. Arrhythmias are problems with the speed or rhythm of the heartbeat. The monitor is a small, portable device. You can wear one while you do your normal daily activities. This is usually used to diagnose what is causing palpitations/syncope (passing out).  [place]   Your physician recommends that you schedule a follow-up appointment in:  [place]

## 2016-07-24 NOTE — Progress Notes (Signed)
Patient ID: Sarah Duncan, female    DOB: August 03, 1965, 52 y.o.   MRN: [id number]  PCP: Harrison Mons, PA-C  Chief Complaint  Patient presents with  . Follow-up    BACK PAIN,CHECK KIDNEY FUNCTION, FILL OUT FORM FOR INSURANCE  . Medication Refill    LYRICA    Subjective:   Presents for completion of forms related to disability due to back pain and depression.  Back pain since 03/2015. Microdiskectomy with Dr. Christia Reading in [place]. Now seeing Dr. Lorin Mercy locally. She has been resistant to resuming PT, due to the pain she previously experienced with it. Now she is willing to try again.  From Dr. Lorin Mercy note 05/03/2016: MRI scan is reviewed with patient and also her wife is present with her. Patient has the disc degenerative changes with no evidence of recurrent disc. Normal scar tissue is present. She has in the plate edema with Modic changes. We discussed a walking plan gradually working up to 2 miles a day avoiding repetitive bending and stooping and twisting until her symptoms settle down. She understands as this will take sometimes up to a year to get resolution of the endplate degenerative changes. Activities and avoid repetitive bending twisting stooping or climbing is recommended as well as a walking program. Flexeril Lyrica and lidocaine patches which she is already on may give her some relief. I discussed with her that would be best avoided narcotic medication since his sometimes takes several months to have the endplate edema changes resolved. Plan recheck her in 6 months. She understands that with endplate degeneration she'll have some back pain for. Of months and that at this time no surgery is indicated. She'll work on a walking program weight loss And core strengthening .  Follow-Up Instructions: Return in about 6 months (around 11/01/2016).  Her depressive symptoms are lessened on the escitalopram, but not resolved.  Today she mentions recurrent palpitations. She  had these previously, 10 years ago, and relates that at the time her PCP suggested that she may need a pacemaker. No associated pain, SOB, nausea, lightheadedness.    Review of Systems As above.    Patient Active Problem List   Diagnosis Date Noted  . BMI 34.0-34.9,adult 11/25/2015  . Depression 11/23/2015  . 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 Chronic lower back pain 06/18/2015     Prior to Admission medications   Medication Sig Start Date End Date Taking? Authorizing Provider  acetaminophen (TYLENOL) 650 MG CR tablet Take 650 mg by mouth every 8 (eight) hours as needed for pain.   Yes Historical Provider, MD  BIOTIN PO Take by mouth daily.   Yes Historical Provider, MD  cyclobenzaprine (FLEXERIL) 10 MG tablet Take 0.5-1 tablets (5-10 mg total) by mouth 3 (three) times daily as needed for muscle spasms. Patient taking differently: Take 5-10 mg by mouth daily.  06/05/16  Yes Torryn Hudspeth, PA-C  escitalopram (LEXAPRO) 20 MG tablet Take 1 tablet (20 mg total) by mouth daily. 04/11/16  Yes Lenoria Narine, PA-C  MELATONIN PO Take by mouth daily.   Yes Historical Provider, MD  Multiple Vitamin (MULTIVITAMIN) tablet Take 1 tablet by mouth daily.   Yes Historical Provider, MD  pregabalin (LYRICA) 200 MG capsule Take 1 capsule (200 mg total) by mouth 3 (three) times daily. 07/18/16  Yes Martell Mcfadyen, PA-C     No Known Allergies     Objective:  Physical Exam  Constitutional: She is oriented to person, place, and time. She appears well-developed and  well-nourished. She is active and cooperative. No distress.  BP 136/82   Pulse 100   Temp 98.3 F (36.8 C) (Oral)   Resp 18   Ht 5' 7.5" (1.715 m)   Wt 224 lb (101.6 kg)   SpO2 100%   BMI 34.57 kg/m   HENT:  Head: Normocephalic and atraumatic.  Right Ear: Hearing normal.  Left Ear: Hearing normal.  Eyes: Conjunctivae are normal. No scleral icterus.  Neck: Normal range of motion. Neck supple. No thyromegaly present.  Cardiovascular:  Normal rate, regular rhythm and normal heart sounds.   Pulses:      Radial pulses are 2+ on the right side, and 2+ on the left side.  Pulmonary/Chest: Effort normal and breath sounds normal.  Musculoskeletal:       Lumbar back: She exhibits decreased range of motion, tenderness, bony tenderness and pain. She exhibits no swelling, no edema, no deformity, no laceration, no spasm and normal pulse.  Lymphadenopathy:       Head (right side): No tonsillar, no preauricular, no posterior auricular and no occipital adenopathy present.       Head (left side): No tonsillar, no preauricular, no posterior auricular and no occipital adenopathy present.    She has no cervical adenopathy.       Right: No supraclavicular adenopathy present.       Left: No supraclavicular adenopathy present.  Neurological: She is alert and oriented to person, place, and time. No sensory deficit.  Skin: Skin is warm, dry and intact. No rash noted. No cyanosis or erythema. Nails show no clubbing.  Psychiatric: She has a normal mood and affect. Her speech is normal and behavior is normal.    Depression screen [facility] 2/9 07/18/2016 05/23/2016 04/11/2016 03/30/2016 03/03/2016  Decreased Interest 0 0 3 0 3  Down, Depressed, Hopeless 0 0 2 2 3   PHQ - 2 Score 0 0 5 2 6   Altered sleeping - - 3 3 3   Tired, decreased energy - - 3 3 3   Change in appetite - - 3 0 2  Feeling bad or failure about yourself  - - 3 3 2   Trouble concentrating - - 3 3 3   Moving slowly or fidgety/restless - - 2 3 0  Suicidal thoughts - - 0 0 0  PHQ-9 Score - - 22 17 19   Difficult doing work/chores - - Very difficult Very difficult -    EKG reviewed with Dr. Tamala Julian. NSR. No arrhythmias.      Assessment & Plan:   1. Chronic bilateral low back pain without sciatica Continue pregabalin and Follow-up with Dr. Lorin Mercy. Encouraged her to contact his office for referral to PT. She is encouraged to discuss her concerns with the PT so that a treatment plan can be  developed that takes in to account her concerns. Needs specific work on core strengthening. - pregabalin (LYRICA) 200 MG capsule; Take 1 capsule (200 mg total) by mouth 3 (three) times daily.  Dispense: 90 capsule; Refill: 0  2. Depression, unspecified depression type Stable. Continue escitalopram.  3. Palpitations Normal EKG here. Given the previous recommendation for pacemaker, refer to cardiology for additional evaluation. - EKG 12-Lead - Ambulatory referral to Cardiology  4. BMI 34.0-34.9,adult Exercise is limited due to her pain, but will hopefully improve with PT. WEight loss will help. - Amb Ref to Medical Weight Management  5. Single kidney Annual renal function is recommended. Renal function testing last done in 09/2015. Will repeat 09/2016.    Autum Benfer  Janalee Dane, PA-C Physician Assistant-Certified [facility]

## 2016-07-25 DIAGNOSIS — Z0271 Encounter for disability determination: Secondary | ICD-10-CM

## 2016-08-03 ENCOUNTER — Telehealth (INDEPENDENT_AMBULATORY_CARE_PROVIDER_SITE_OTHER): Payer: Self-pay | Admitting: Orthopaedic Surgery

## 2016-08-03 NOTE — Telephone Encounter (Signed)
FAXED RECORDS 04/21/2016 TO [id number] TO 1 [phone]

## 2016-08-10 ENCOUNTER — Ambulatory Visit (INDEPENDENT_AMBULATORY_CARE_PROVIDER_SITE_OTHER): Payer: 59

## 2016-08-10 ENCOUNTER — Encounter (INDEPENDENT_AMBULATORY_CARE_PROVIDER_SITE_OTHER): Payer: 59 | Admitting: Family Medicine

## 2016-08-10 ENCOUNTER — Telehealth: Payer: Self-pay | Admitting: Student

## 2016-08-10 DIAGNOSIS — R002 Palpitations: Secondary | ICD-10-CM

## 2016-08-10 NOTE — Telephone Encounter (Signed)
New Message     Returning call to get test results

## 2016-08-10 NOTE — Telephone Encounter (Signed)
Notes Recorded by Erma Heritage, PA on 07/25/2016 at 7:36 AM EST Please let the patient know TSH and electrolytes are within normal limits. Continue with plans for Holter monitor.  Holter placed today  Pt notified

## 2016-08-22 ENCOUNTER — Ambulatory Visit (INDEPENDENT_AMBULATORY_CARE_PROVIDER_SITE_OTHER): Payer: 59 | Admitting: Family Medicine

## 2016-08-23 ENCOUNTER — Ambulatory Visit (INDEPENDENT_AMBULATORY_CARE_PROVIDER_SITE_OTHER): Payer: 59 | Admitting: Orthopaedic Surgery

## 2016-08-23 ENCOUNTER — Encounter (INDEPENDENT_AMBULATORY_CARE_PROVIDER_SITE_OTHER): Payer: Self-pay | Admitting: Orthopaedic Surgery

## 2016-08-23 VITALS — BP 129/87 | HR 73 | Ht 67.5 in | Wt 220.0 lb

## 2016-08-23 DIAGNOSIS — G8929 Other chronic pain: Secondary | ICD-10-CM

## 2016-08-23 DIAGNOSIS — M545 Low back pain: Secondary | ICD-10-CM | POA: Diagnosis not present

## 2016-08-23 NOTE — Addendum Note (Signed)
Addended by: Maxcine Ham on: 08/23/2016 12:23 PM   Modules accepted: Orders

## 2016-08-23 NOTE — Progress Notes (Signed)
Office Visit Note   [name]           Date of Birth: [birth year]           MRN: [id number] Visit Date: 08/23/2016              Requested by: Harrison Mons, PA-C [address] [place], [place] [address] PCP: Harrison Mons, PA-C   Assessment & Plan: Visit Diagnoses:  1. Chronic low back pain, unspecified back pain laterality, with sciatica presence unspecified     Plan: Patient's tried to work on walking can only walk about 10 minutes and starts having pain. She's been through physical therapy with any year ago and we will restart some therapy for core strengthening. She's been using Lexapro and Lyrica in the past. She takes Flexeril at night. We talked about weight loss decreasing her BMI and core strengthening to help unload her back. L4-5 disc level shows some dehydration with loss of disc space height and some edema of the endplates adjacent to the L4-5 level which is undergoing degeneration. She does not have any nerve compression. I will check her again in 4 months. We reviewed her MRI scan reviewed the report with her once again and reviewed images of the L4-5 level which showed some degeneration. Follow-Up Instructions: Return in about 4 months (around 12/23/2016).   Orders:  No orders of the defined types were placed in this encounter.  No orders of the defined types were placed in this encounter.     Procedures: No procedures performed   Clinical Data: No additional findings.   Subjective: Chief Complaint  Patient presents with  . Lower Back - Pain    Patient returns with continued low back pain. She states that it has not gotten any better, and is actually a little worse. She is taking flexeril, lyrica, and hydrocodone as needed. She states that the flexeril helps her to sleep.     Review of Systems  Constitutional: Negative for chills and diaphoresis.  HENT: Negative for ear discharge, ear pain and nosebleeds.   Eyes: Negative for  discharge and visual disturbance.  Respiratory: Negative for cough, choking and shortness of breath.   Cardiovascular: Negative for chest pain and palpitations.  Gastrointestinal: Negative for abdominal distention and abdominal pain.  Endocrine: Negative for cold intolerance and heat intolerance.  Genitourinary: Negative for flank pain and hematuria.       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  Musculoskeletal: Positive for back pain. Negative for joint swelling.  Skin: Negative for rash and wound.  Neurological: Negative for seizures and speech difficulty.  Hematological: Negative for adenopathy. Does not bruise/bleed easily.  Psychiatric/Behavioral: Negative for agitation and suicidal ideas.       Positive for depression.     Objective: Vital Signs: BP 129/87   Pulse 73   Ht 5' 7.5" (1.715 m)   Wt 220 lb (99.8 kg)   BMI 33.95 kg/m   Physical Exam  Constitutional: She is oriented to person, place, and time. She appears well-developed.  HENT:  Head: Normocephalic.  Right Ear: External ear normal.  Left Ear: External ear normal.  Eyes: Pupils are equal, round, and reactive to light.  Neck: No tracheal deviation present. No thyromegaly present.  Cardiovascular: Normal rate.   Pulmonary/Chest: Effort normal.  Abdominal: Soft.  Musculoskeletal:  Lumbar incision well healed right paralumbar. She has some tenderness with palpation. Negative straight leg raising 90 no pain with hip range of motion. Normal heel toe  gait. Anterior tib gastrocsoleus is strong.  Neurological: She is alert and oriented to person, place, and time.  Skin: Skin is warm and dry.  Psychiatric: She has a normal mood and affect. Her behavior is normal.    Ortho Exam  Specialty Comments:  No specialty comments available.  Imaging:Study Result   CLINICAL DATA:  Central low back pain for greater than 1 year. Lumbar surgery in February 2017.  EXAM: MRI LUMBAR SPINE WITHOUT AND WITH  CONTRAST  TECHNIQUE: Multiplanar and multiecho pulse sequences of the lumbar spine were obtained without and with intravenous contrast.  CONTRAST:  10mL MULTIHANCE GADOBENATE DIMEGLUMINE 529 MG/ML IV SOLN  COMPARISON:  03/03/2016 radiographs  FINDINGS: Segmentation: Correlation with conventional radiography reveals that the transitional lumbosacral vertebra is L5.  Alignment:  3 mm degenerative retrolisthesis at L3-4.  Vertebrae:  Type 2 degenerative endplate findings at O1-3  Conus medullaris: Extends to the T12 level and appears normal. No abnormal nerve root clumping or enhancement to suggest arachnoiditis.  Paraspinal and other soft tissues: Absent left kidney, prior port the patient donated the kidney.  Disc levels:  T10-11: Mild bilateral foraminal stenosis due to biforaminal disc protrusions. This level is only included on the parasagittal images.  T11-12: Unremarkable. This level is only included on the parasagittal images.  T12-L1:  Unremarkable.  L1-2:  Unremarkable.  L2-3:  No impingement.  Mild disc bulge.  L3-4: Mild left and borderline right foraminal stenosis and mild central narrowing of the thecal sac due to disc bulge, facet arthropathy, and minimal intervertebral spurring.  L4-5: Mild to moderate left and mild right foraminal stenosis due to right greater than left facet arthropathy. Enhancement in the right lateral recess attributed to epidural fibrosis which surrounds the right L5 nerve roots without obvious impingement. Prior right laminectomy.  L5-S1: No impingement.  Transitional morphology.  IMPRESSION: 1. Lumbar spondylosis and degenerative disc disease with mild to moderate impingement at L4-5 and mild impingement at L3-4 as detailed above. 2. On balance, I do not believe that there is significant impingement in the right lateral recess at the L4-5 level. There is epidural fibrosis in this vicinity lining the L5  nerve roots but I do not believe there is a recurrent disc fragment in this area. 3. Transitional L5 vertebra.   Electronically Signed   By: Van Clines M.D.   On: 04/30/2016 15:50       PMFS History: Patient Active Problem List   Diagnosis Date Noted  . BMI 34.0-34.9,adult 11/25/2015  . Depression 11/23/2015  . 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 Chronic lower back pain 06/18/2015   Past Medical History:  Diagnosis Date  . Anemia   . Fibroids   . 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 Diabetes Mother   . Hypertension Mother   . Diabetes Brother   . Hypertension Brother   . Cancer Father   . Endometriosis Daughter   . Diabetes Daughter   . HIV Son     Past Surgical History:  Procedure Laterality Date  . ABDOMINAL HYSTERECTOMY  2007  . CESAREAN SECTION  11/19/1989  . 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 SPINE SURGERY  07/13/2015   Dr. Christia Reading  . TUBAL LIGATION  05/09/1993   Social History   Occupational History  . [employer]     out  of work since 03/2015   Social History Main Topics  . Smoking status: Former Smoker    Years: 1.00    Types: Cigarettes  . Smokeless tobacco: Never Used  . Alcohol use 0.6 oz/week    1 Standard drinks or equivalent per week     Comment: social  . Drug use: No  . Sexual activity: Yes    Partners: Female

## 2016-08-24 ENCOUNTER — Ambulatory Visit (INDEPENDENT_AMBULATORY_CARE_PROVIDER_SITE_OTHER): Payer: 59 | Admitting: Family Medicine

## 2016-08-24 ENCOUNTER — Encounter (INDEPENDENT_AMBULATORY_CARE_PROVIDER_SITE_OTHER): Payer: Self-pay | Admitting: Family Medicine

## 2016-08-24 VITALS — BP 130/84 | HR 74 | Temp 98.0°F | Resp 16 | Ht 67.0 in | Wt 224.0 lb

## 2016-08-24 DIAGNOSIS — R5383 Other fatigue: Secondary | ICD-10-CM | POA: Diagnosis not present

## 2016-08-24 DIAGNOSIS — R0602 Shortness of breath: Secondary | ICD-10-CM | POA: Diagnosis not present

## 2016-08-24 DIAGNOSIS — E669 Obesity, unspecified: Secondary | ICD-10-CM

## 2016-08-24 DIAGNOSIS — Z6835 Body mass index (BMI) 35.0-35.9, adult: Secondary | ICD-10-CM | POA: Diagnosis not present

## 2016-08-24 DIAGNOSIS — E162 Hypoglycemia, unspecified: Secondary | ICD-10-CM | POA: Diagnosis not present

## 2016-08-24 DIAGNOSIS — Z0289 Encounter for other administrative examinations: Secondary | ICD-10-CM

## 2016-08-24 DIAGNOSIS — Z1389 Encounter for screening for other disorder: Secondary | ICD-10-CM | POA: Diagnosis not present

## 2016-08-24 DIAGNOSIS — Z9189 Other specified personal risk factors, not elsewhere classified: Secondary | ICD-10-CM | POA: Diagnosis not present

## 2016-08-24 DIAGNOSIS — Z1331 Encounter for screening for depression: Secondary | ICD-10-CM

## 2016-08-24 NOTE — Progress Notes (Signed)
Office: [phone]  /  Fax: [phone]   HPI:   Chief Complaint: OBESITY  Sarah Duncan (MR# [id number]) is a 51 y.o. female who presents on 08/24/2016 for obesity evaluation and treatment. Current BMI is Body mass index is 35.08 kg/m.Marland Kitchen Sarah Duncan has struggled with obesity for years and has been unsuccessful in either losing weight or maintaining long term weight loss. Sarah Duncan attended our information session and states she is currently in the action stage of change and ready to dedicate time achieving and maintaining a healthier weight.  Sarah Duncan states her family eats meals together she thinks her family will eat healthier with  her her desired weight loss is 40 lbs she started gaining weight when she started taking medications her heaviest weight ever was 225 lbs. she is a picky eater and doesn't like to eat healthier foods  she has significant food cravings issues  she snacks frequently in the evenings she is frequently drinking liquids with calories she frequently makes poor food choices   Fatigue Sarah Duncan feels her energy is lower than it should be. This has worsened with weight gain and has not worsened recently. Sarah Duncan admits to daytime somnolence and  admits to waking up still tired. Patient is at risk for obstructive sleep apnea. Patent has a history of symptoms of daytime fatigue. Patient generally gets 7 hours of sleep per night, and states they generally have generally restful sleep. Snoring is present. Apneic episodes are not present. Epworth Sleepiness Score is 6  Dyspnea on exertion Sarah Duncan notes increasing shortness of breath with exercising and seems to be worsening over time with weight gain. She notes getting out of breath sooner with activity than she used to. This has not gotten worse recently. Sarah Duncan denies orthopnea.  Hyperglycemia Sarah Duncan recently had a fasting glucose of 103 and has a positive family history of diabetes, no previous diabetes work-up. She admits to  polyphagia. No post prandial.  At risk for diabetes Sarah Duncan is at higher than average risk for developing diabetes due to her obesity and hyperglycemia. She currently denies polydipsia.  Depression Screen Sarah Duncan's Food and Mood (modified PHQ-9) score was  Depression screen PHQ 2/9 08/24/2016  Decreased Interest 1  Down, Depressed, Hopeless 2  PHQ - 2 Score 3  Altered sleeping 0  Tired, decreased energy 1  Change in appetite 1  Feeling bad or failure about yourself  0  Trouble concentrating 0  Moving slowly or fidgety/restless 0  Suicidal thoughts 0  PHQ-9 Score 5  Difficult doing work/chores -    ALLERGIES: No Known Allergies  MEDICATIONS: Current Outpatient Prescriptions on File Prior to Visit  Medication Sig Dispense Refill  . acetaminophen (TYLENOL) 650 MG CR tablet Take 650 mg by mouth every 8 (eight) hours as needed for pain.    Marland Kitchen BIOTIN PO Take by mouth daily.    . cyclobenzaprine (FLEXERIL) 10 MG tablet Take 0.5-1 tablets (5-10 mg total) by mouth 3 (three) times daily as needed for muscle spasms. (Patient taking differently: Take 5-10 mg by mouth daily. ) 90 tablet 0  . escitalopram (LEXAPRO) 20 MG tablet Take 1 tablet (20 mg total) by mouth daily. 30 tablet 3  . MELATONIN PO Take by mouth daily.    . Multiple Vitamin (MULTIVITAMIN) tablet Take 1 tablet by mouth daily.    . pregabalin (LYRICA) 200 MG capsule Take 1 capsule (200 mg total) by mouth 3 (three) times daily. 90 capsule 0   No current facility-administered medications on file prior to  visit.     PAST MEDICAL HISTORY: Past Medical History:  Diagnosis Date  . Anemia   . Anxiety   . Arthritis   . Constipation   . Depression   . Fibroids   . 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 Lactose intolerance   . Palpitations   . Shortness of breath   . 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 ABDOMINAL  HYSTERECTOMY  2007  . CESAREAN SECTION  11/19/1989  . 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 SPINE SURGERY  07/13/2015   Dr. Christia Reading  . TUBAL LIGATION  05/09/1993    SOCIAL HISTORY: Social History  Substance Use Topics  . Smoking status: Former Smoker    Years: 1.00    Types: Cigarettes  . Smokeless tobacco: Never Used  . Alcohol use 0.6 oz/week    1 Standard drinks or equivalent per week     Comment: social    FAMILY HISTORY: Family History  Problem Relation Age of Onset  . Diabetes Mother   . Hypertension Mother   . Diabetes Brother   . Hypertension Brother   . Cancer Father   . Endometriosis Daughter   . Diabetes Daughter   . HIV Son     ROS: Review of Systems  Constitutional: Positive for malaise/fatigue.  HENT:       Dentures   Eyes:       Wear Glasses or Contacts Flashes of Light  Respiratory: Positive for shortness of breath (with exertion).   Cardiovascular: Positive for palpitations and claudication. Negative for orthopnea.  Genitourinary: Positive for frequency.  Musculoskeletal: Positive for back pain.  Neurological: Positive for dizziness.  Endo/Heme/Allergies: Negative for polydipsia.       Hyperglycemia polyphagia  Psychiatric/Behavioral: Positive for depression. The patient has insomnia.        Stress    PHYSICAL EXAM: Blood pressure 130/84, pulse 74, temperature 98 F (36.7 C), temperature source Oral, resp. rate 16, height 5\' 7"  (1.702 m), weight 224 lb (101.6 kg), SpO2 97 %. Body mass index is 35.08 kg/m. Physical Exam  Constitutional: She is oriented to person, place, and time. She appears well-developed and well-nourished.  Cardiovascular: Normal rate.   Pulmonary/Chest: Effort normal.  Musculoskeletal: Normal range of motion.  Neurological: She is oriented to person, place, and time.  Skin: Skin is warm and dry.  Vitals reviewed.   RECENT LABS AND TESTS: BMET    Component Value Date/Time   NA 139 07/24/2016  1121   K 4.6 07/24/2016 1121   CL 103 07/24/2016 1121   CO2 26 07/24/2016 1121   GLUCOSE 103 (H) 07/24/2016 1121   BUN 12 07/24/2016 1121   CREATININE 1.10 07/24/2016 1121   CALCIUM 10.0 07/24/2016 1121   No results found for: HGBA1C No results found for: INSULIN CBC    Component Value Date/Time   WBC 6.1 10/12/2015 1445   RBC 4.85 10/12/2015 1445   HGB 13.7 10/12/2015 1445   HCT 40.8 10/12/2015 1445   PLT 260 10/12/2015 1445   MCV 84.1 10/12/2015 1445   MCH 28.2 10/12/2015 1445   MCHC 33.6 10/12/2015 1445   RDW 13.8 10/12/2015 1445   LYMPHSABS 2,745 10/12/2015 1445   MONOABS 610 10/12/2015 1445   EOSABS 122 10/12/2015 1445   BASOSABS 0 10/12/2015 1445   Iron/TIBC/Ferritin/ %Sat No results found for: IRON, TIBC,  [facility] Lipid Panel     Component Value Date/Time   CHOL 195 10/12/2015 1445   TRIG 96 10/12/2015 1445   HDL 54 10/12/2015 1445   CHOLHDL 3.6 10/12/2015 1445   VLDL 19 10/12/2015 1445   LDLCALC 122 10/12/2015 1445   Hepatic Function Panel     Component Value Date/Time   PROT 7.5 10/12/2015 1445   ALBUMIN 4.3 10/12/2015 1445   AST 13 10/12/2015 1445   ALT 5 (L) 10/12/2015 1445   ALKPHOS 106 10/12/2015 1445   BILITOT 0.3 10/12/2015 1445      Component Value Date/Time   TSH 3.92 07/24/2016 1121   TSH 2.79 10/12/2015 1445    ECG  shows NSR with a rate of 78 BPM INDIRECT CALORIMETER done today shows a VO2 of 208 and a REE of 1450.    ASSESSMENT AND PLAN: Other fatigue - Plan: Comprehensive metabolic panel, CBC with Differential/Platelet, Lipid Panel With LDL/HDL Ratio, Vitamin B12, Folate, TSH, T4, free, T3, VITAMIN D 25 Hydroxy (Vit-D Deficiency, Fractures)  SOB (shortness of breath) on exertion  Hyperglycemia - Plan: Hemoglobin A1c, Insulin, random  At risk for diabetes mellitus  Depression screening  Class 2 obesity without serious comorbidity with body mass index (BMI) of 35.0 to 35.9 in adult, unspecified obesity  type  PLAN:  Fatigue Najla was informed that her fatigue may be related to obesity, depression or many other causes. Labs will be ordered, and in the meanwhile Daina has agreed to work on diet, exercise and weight loss to help with fatigue. Proper sleep hygiene was discussed including the need for 7-8 hours of quality sleep each night. A sleep study was not ordered based on symptoms and Epworth score.  Dyspnea on exertion [place]ia's shortness of breath appears to be obesity related and exercise induced. She has agreed to work on weight loss and gradually increase exercise to treat her exercise induced shortness of breath. If Sarah Duncan follows our instructions and loses weight without improvement of her shortness of breath, we will plan to refer to pulmonology. We will monitor this condition regularly. Sarah Duncan agrees to this plan.  Hyperglycemia Fasting labs will be obtained and results with be discussed with Sarah Duncan in 2 weeks at her follow up visit. In the meanwhile Sarah Duncan was started on a lower simple carbohydrate diet and will work on weight loss efforts.  Diabetes risk counselling Sarah Duncan was given extended (at least 15 minutes) diabetes prevention counseling today. She is 51 y.o. female and has risk factors for diabetes including obesity and hyperglycemia. We discussed intensive lifestyle modifications today with an emphasis on weight loss as well as increasing exercise and decreasing simple carbohydrates in her diet.  Depression Screen Sarah Duncan had a mildly positive depression screening. Depression is commonly associated with obesity and often results in emotional eating behaviors. We will monitor this closely and work on CBT to help improve the non-hunger eating patterns. Referral to Psychology may be required if no improvement is seen as she continues in our clinic.  Obesity Sarah Duncan is currently in the action stage of change and her goal is to continue with weight loss efforts She has agreed to follow the  Category 2 plan + 100 calories Sarah Duncan has been instructed to work up to a goal of 150 minutes of combined cardio and strengthening exercise per week for weight loss and overall health benefits. We discussed the following Behavioral Modification Stratagies today: increasing lean protein intake, decrease eating out and work on meal planning and easy  cooking plans  Laya has agreed to follow up with our clinic in 2 weeks. She was informed of the importance of frequent follow up visits to maximize her success with intensive lifestyle modifications for her multiple health conditions. She was informed we would discuss her lab results at her next visit unless there is a critical issue that needs to be addressed sooner. Sarah Duncan agreed to keep her next visit at the agreed upon time to discuss these results.  I, Doreene Nest, am acting as scribe for Dennard Nip, MD  I have reviewed the above documentation for accuracy and completeness, and I agree with the above. -Dennard Nip, MD

## 2016-08-25 LAB — VITAMIN B12: VITAMIN B 12: 448 pg/mL (ref 232–1245)

## 2016-08-25 LAB — LIPID PANEL WITH LDL/HDL RATIO
Cholesterol, Total: 185 mg/dL (ref 100–199)
HDL: 49 mg/dL (ref >39)
LDL Calculated: 111 mg/dL — ABNORMAL HIGH (ref 0–99)
LDL/HDL RATIO: 2.3 ratio (ref 0.0–3.2)
Triglycerides: 127 mg/dL (ref 0–149)
VLDL Cholesterol Cal: 25 mg/dL (ref 5–40)

## 2016-08-25 LAB — CBC WITH DIFFERENTIAL/PLATELET
Basophils Absolute: 0 10*3/uL (ref 0.0–0.2)
Basos: 0 %
EOS (ABSOLUTE): 0.2 10*3/uL (ref 0.0–0.4)
EOS: 2 %
HEMATOCRIT: 41.2 % (ref 34.0–46.6)
HEMOGLOBIN: 13.5 g/dL (ref 11.1–15.9)
Immature Grans (Abs): 0 10*3/uL (ref 0.0–0.1)
Immature Granulocytes: 0 %
Lymphocytes Absolute: 3.5 10*3/uL — ABNORMAL HIGH (ref 0.7–3.1)
Lymphs: 47 %
MCH: 28.3 pg (ref 26.6–33.0)
MCHC: 32.8 g/dL (ref 31.5–35.7)
MCV: 86 fL (ref 79–97)
MONOCYTES: 8 %
MONOS ABS: 0.6 10*3/uL (ref 0.1–0.9)
NEUTROS ABS: 3.2 10*3/uL (ref 1.4–7.0)
Neutrophils: 43 %
Platelets: 276 10*3/uL (ref 150–379)
RBC: 4.77 x10E6/uL (ref 3.77–5.28)
RDW: 14.6 % (ref 12.3–15.4)
WBC: 7.5 10*3/uL (ref 3.4–10.8)

## 2016-08-25 LAB — T3: T3, Total: 131 ng/dL (ref 71–180)

## 2016-08-25 LAB — COMPREHENSIVE METABOLIC PANEL
ALK PHOS: 116 IU/L (ref 39–117)
ALT: 10 IU/L (ref 0–32)
AST: 15 IU/L (ref 0–40)
Albumin/Globulin Ratio: 1.2 (ref 1.2–2.2)
Albumin: 4.1 g/dL (ref 3.5–5.5)
BUN / CREAT RATIO: 12 (ref 9–23)
BUN: 13 mg/dL (ref 6–24)
CALCIUM: 10.1 mg/dL (ref 8.7–10.2)
CO2: 24 mmol/L (ref 18–29)
CREATININE: 1.13 mg/dL — AB (ref 0.57–1.00)
Chloride: 98 mmol/L (ref 96–106)
GFR calc non Af Amer: 58 mL/min/{1.73_m2} — ABNORMAL LOW (ref >59)
GFR, EST AFRICAN AMERICAN: 66 mL/min/{1.73_m2} (ref >59)
GLOBULIN, TOTAL: 3.5 g/dL (ref 1.5–4.5)
GLUCOSE: 103 mg/dL — AB (ref 65–99)
Potassium: 4.8 mmol/L (ref 3.5–5.2)
SODIUM: 139 mmol/L (ref 134–144)
TOTAL PROTEIN: 7.6 g/dL (ref 6.0–8.5)

## 2016-08-25 LAB — HEMOGLOBIN A1C
ESTIMATED AVERAGE GLUCOSE: 126 mg/dL
HEMOGLOBIN A1C: 6 % — AB (ref 4.8–5.6)

## 2016-08-25 LAB — TSH: TSH: 1.94 u[IU]/mL (ref 0.450–4.500)

## 2016-08-25 LAB — T4, FREE: FREE T4: 1.02 ng/dL (ref 0.82–1.77)

## 2016-08-25 LAB — INSULIN, RANDOM: INSULIN: 31.7 u[IU]/mL — ABNORMAL HIGH (ref 2.6–24.9)

## 2016-08-25 LAB — FOLATE: Folate: 11.1 ng/mL (ref >3.0)

## 2016-08-25 LAB — VITAMIN D 25 HYDROXY (VIT D DEFICIENCY, FRACTURES): Vit D, 25-Hydroxy: 16.2 ng/mL — ABNORMAL LOW (ref 30.0–100.0)

## 2016-09-01 MED FILL — ESCITALOPRAM 20 MG TABLET: 20 | 30 days supply | Qty: 30 | Fill #2

## 2016-09-04 ENCOUNTER — Ambulatory Visit: Payer: 59 | Attending: Orthopaedic Surgery | Admitting: Physical Therapy

## 2016-09-04 ENCOUNTER — Encounter: Payer: Self-pay | Admitting: Physical Therapy

## 2016-09-04 DIAGNOSIS — M545 Low back pain, unspecified: Secondary | ICD-10-CM

## 2016-09-04 DIAGNOSIS — R252 Cramp and spasm: Secondary | ICD-10-CM | POA: Diagnosis not present

## 2016-09-04 DIAGNOSIS — G8929 Other chronic pain: Secondary | ICD-10-CM | POA: Insufficient documentation

## 2016-09-04 DIAGNOSIS — R262 Difficulty in walking, not elsewhere classified: Secondary | ICD-10-CM | POA: Diagnosis not present

## 2016-09-04 NOTE — Therapy (Signed)
[place] [place], [place], [address] Phone: [phone]   Fax:  [phone]  Physical Therapy Evaluation  Patient Details  Name: Sarah Duncan MRN: [id number] Date of Birth: 03-Nov-1965 Referring Provider: Olga Millers  Encounter Date: 09/04/2016      PT End of Session - 09/04/16 1132    Visit Number 1   Date for PT Re-Evaluation 11/04/16   PT Start Time 1103   PT Stop Time 1150   PT Time Calculation (min) 47 min   Activity Tolerance Patient tolerated treatment well   Behavior During Therapy [facility] for tasks assessed/performed      Past Medical History:  Diagnosis Date  . Anemia   . Anxiety   . Arthritis   . Constipation   . Depression   . Fibroids   . 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 Lactose intolerance   . Palpitations   . Shortness of breath   . 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 ABDOMINAL HYSTERECTOMY  2007  . CESAREAN SECTION  11/19/1989  . 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 SPINE SURGERY  07/13/2015   Dr. Christia Reading  . TUBAL LIGATION  05/09/1993    There were no vitals filed for this visit.       Subjective Assessment - 09/04/16 1107    Subjective Patient reports that she had low back surgery in February while living in [place].  She moved to [facility], continued to have pain, we saw her for about 2 months with little change.  She reports that she had an MRI in December that showed DDD and a bulging disc.  She reports that low back pain has continued with no radicular signs.   Limitations Standing;Sitting;House hold activities   How long can you sit comfortably? 45 minutes   How long can you stand comfortably? 10 minutes   How long can you walk comfortably? 10 minutes   Patient Stated Goals have less pain   Currently in Pain? Yes   Pain Score 7    Pain Location  Back   Pain Orientation Mid;Lower   Pain Descriptors / Indicators Aching;Spasm   Pain Type Chronic pain   Pain Onset More than a month ago   Pain Frequency Constant   Aggravating Factors  sitting, standing, walking all > 10 minutes will increase pain up to 9/10   Pain Relieving Factors nothing really helps    Effect of Pain on Daily Activities limits all ADL's            [facility] PT Assessment - 09/04/16 0001      Assessment   Medical Diagnosis LBP/DDD   Referring Provider M. Yates   Onset Date/Surgical Date 07/07/16   Prior Therapy yes with little changes     Precautions   Precautions None     Balance Screen   Has the patient fallen in the past 6 months No   Has the patient had a decrease in activity level because of a fear of falling?  No   Is the patient reluctant to leave their home because of a fear of falling?  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#, recently was let go secondary to being out with the LBP since November   Leisure no exercise     Posture/Postural Control   Posture Comments increased lordosis     AROM   Overall AROM Comments Lumbar ROM was decreased 50% with low back pain, really had difficulty getting back to neutral after forward trunk flexion     Strength   Overall Strength Comments LE's 4-/5 with pain in the back for MMT of the LE's     Flexibility   Soft Tissue Assessment /Muscle Length --  tight calves, HS     Palpation   Palpation comment very tender in the lumbar parapsinals and into the buttocks, she does have significant tightness in the lumbar area with spasms present     Ambulation/Gait   Gait Comments no device, slow, slight forward flexed trunks, she reports that there has been times that she has to use an  electric cart to shop due to pain with standing and walking                   [facility] Adult PT Treatment/Exercise - 09/04/16 0001      Moist Heat Therapy   Number Minutes Moist Heat 15 Minutes   Moist Heat Location Lumbar Spine     Electrical Stimulation   Electrical Stimulation Location Lumbar area   Electrical Stimulation Action IFC   Electrical Stimulation Parameters sitting   Electrical Stimulation Goals Pain                PT Education - 09/04/16 1132    Education provided Yes   Education Details Went over anatomy, the diagnosis of DDD and bulging disc and what traction could do to help   Person(s) Educated Patient   Methods Explanation;Demonstration   Comprehension Verbalized understanding          PT Short Term Goals - 09/04/16 1137      PT SHORT TERM GOAL #1   Title independent with initial HEP   Time 2   Period Weeks   Status New           PT Long Term Goals - 09/04/16 1137      PT LONG TERM GOAL #1   Title understand posture and body mechanics   Time 8   Period Weeks   Status New     PT LONG TERM GOAL #2   Title decrease pain 50%   Time 8   Period Weeks   Status New     PT LONG TERM GOAL #3   Title increase lumbar ROM 50%   Time 8   Period Weeks   Status New     PT LONG TERM GOAL #4   Title tolerate grocery shopping without increase of pain   Time 8   Period Weeks   Status New     PT LONG TERM GOAL #5   Title increase LE strength to 4/5   Time 8   Period Weeks   Status New               Plan - 09/04/16 1133    Clinical Impression Statement Patient had a lumbar surgery in February 2017.  She had PT for about 2 months with little relief.  She has had a recent MRI that showed DDD and a bulging disc.  She reports that over the past 6 months  she has had more and more difficulty walking due to pain.  her lumbar ROM is decreased 50% with pain.  She does have significant spasms   Rehab Potential Fair   PT  Frequency 2x / week   PT Duration 8 weeks   PT Treatment/Interventions ADLs/Self Care Home Management;Cryotherapy;Electrical Stimulation;Moist Heat;Therapeutic exercise;Therapeutic activities;Functional mobility training;Ultrasound;Traction;Neuromuscular re-education;Patient/family education;Manual techniques   PT Next Visit Plan Try traction next visit   Consulted and Agree with Plan of Care Patient      Patient will benefit from skilled therapeutic intervention in order to improve the following deficits and impairments:  Decreased mobility, Decreased range of motion, Difficulty walking, Decreased strength, Impaired flexibility, Increased muscle spasms, Pain, Improper body mechanics  Visit Diagnosis: Chronic bilateral low back pain without sciatica - Plan: PT plan of care cert/re-cert  Difficulty in walking, not elsewhere classified - Plan: PT plan of care cert/re-cert  Cramp and spasm - Plan: PT plan of care cert/re-cert     Problem List Patient Active Problem List   Diagnosis Date Noted  . BMI 34.0-34.9,adult 11/25/2015  . Depression 11/23/2015  . 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 Chronic lower back pain 06/18/2015    Sumner Boast., PT 09/04/2016, 11:39 AM  [address] [place], [place], [address] Phone: [phone]   Fax:  [phone]  Name: Sarah Duncan MRN: [id number] Date of Birth: [birth year]

## 2016-09-12 ENCOUNTER — Ambulatory Visit (INDEPENDENT_AMBULATORY_CARE_PROVIDER_SITE_OTHER): Payer: 59 | Admitting: Family Medicine

## 2016-09-12 VITALS — BP 112/76 | HR 85 | Temp 97.9°F | Ht 67.0 in | Wt 221.0 lb

## 2016-09-12 DIAGNOSIS — Z6834 Body mass index (BMI) 34.0-34.9, adult: Secondary | ICD-10-CM

## 2016-09-12 DIAGNOSIS — E559 Vitamin D deficiency, unspecified: Secondary | ICD-10-CM

## 2016-09-12 DIAGNOSIS — Z9189 Other specified personal risk factors, not elsewhere classified: Secondary | ICD-10-CM | POA: Diagnosis not present

## 2016-09-12 DIAGNOSIS — F3289 Other specified depressive episodes: Secondary | ICD-10-CM

## 2016-09-12 DIAGNOSIS — E669 Obesity, unspecified: Secondary | ICD-10-CM | POA: Diagnosis not present

## 2016-09-12 MED ORDER — BUPROPION HCL ER (SR) 150 MG PO TB12: 150.0000 mg | ORAL_TABLET | Freq: Every day | ORAL | 0 refills | Status: DC

## 2016-09-12 MED ORDER — VITAMIN D (ERGOCALCIFEROL) 1.25 MG (50000 UNIT) PO CAPS: 50000.0000 [IU] | ORAL_CAPSULE | ORAL | 0 refills | Status: DC

## 2016-09-12 MED FILL — VIT D2 1.25 MG (50,000 UNIT: 1.25 MG | 28 days supply | Qty: 4 | Fill #0

## 2016-09-12 MED FILL — BUPROPION SR 150 MG TABLET: 150 | 30 days supply | Qty: 30 | Fill #0

## 2016-09-12 NOTE — Progress Notes (Signed)
Office: [phone]  /  Fax: [phone]   HPI:   Chief Complaint: OBESITY Sarah Duncan is here to discuss her progress with her obesity treatment plan. She is following her eating plan approximately 75 to 80 % of the time and states she is exercising 0 minutes 0 times per week. [name] did well losing weight. She is tired of breakfast options and would like additional options. She struggled to eat all her dinner and struggled with pm snacking. Her weight is 221 lb (100.2 kg) today and has had a weight loss of 3 pounds over a period of 2 to 3 weeks since her last visit. She has lost 3 lbs since starting treatment with [place].  Vitamin D deficiency [name] has a diagnosis of vitamin D deficiency. She is currently on vitamin D, level is low. She admits fatigue and denies nausea, vomiting or muscle weakness.  Depression with emotional eating behaviors [name] is struggling with emotional eating and using food for comfort to the extent that it is negatively impacting her health. She often snacks when she is not hungry. [name] sometimes feels she is out of control and then feels guilty that she made poor food choices. She is on Lexapro, mood is stable but she uses food for comfort frequently. She has been working on behavior modification techniques to help reduce her emotional eating and has been somewhat successful. She shows no sign of suicidal or homicidal ideations.  At risk for diabetes [name] is at higher than average risk for developing diabetes due to her obesity. She currently denies polyuria or polydipsia.  Depression screen [facility] 2/9 08/24/2016 07/18/2016 05/23/2016 04/11/2016 03/30/2016  Decreased Interest 1 0 0 3 0  Down, Depressed, Hopeless 2 0 0 2 2  PHQ - 2 Score 3 0 0 5 2  Altered sleeping 0 - - 3 3  Tired, decreased energy 1 - - 3 3  Change in appetite 1 - - 3 0  Feeling bad or failure about yourself  0 - - 3 3  Trouble concentrating 0 - - 3 3  Moving slowly or fidgety/restless 0 - - 2 3    Suicidal thoughts 0 - - 0 0  PHQ-9 Score 5 - - 22 17  Difficult doing work/chores - - - Very difficult Very difficult     Wt Readings from Last 500 Encounters:  09/12/16 221 lb (100.2 kg)  08/24/16 224 lb (101.6 kg)  08/23/16 220 lb (99.8 kg)  07/24/16 220 lb (99.8 kg)  07/18/16 224 lb (101.6 kg)  05/23/16 224 lb (101.6 kg)  05/03/16 225 lb (102.1 kg)  04/12/16 223 lb (101.2 kg)  04/11/16 223 lb 12.8 oz (101.5 kg)  03/30/16 229 lb (103.9 kg)  03/03/16 224 lb (101.6 kg)  11/25/15 206 lb (93.4 kg)  11/25/15 206 lb (93.4 kg)  10/12/15 207 lb 9.6 oz (94.2 kg)  08/15/15 201 lb (91.2 kg)     ALLERGIES: No Known Allergies  MEDICATIONS: Current Outpatient Prescriptions on File Prior to Visit  Medication Sig Dispense Refill  . acetaminophen (TYLENOL) 650 MG CR tablet Take 650 mg by mouth every 8 (eight) hours as needed for pain.    Marland Kitchen BIOTIN PO Take by mouth daily.    . cyclobenzaprine (FLEXERIL) 10 MG tablet Take 0.5-1 tablets (5-10 mg total) by mouth 3 (three) times daily as needed for muscle spasms. (Patient taking differently: Take 5-10 mg by mouth daily. ) 90 tablet 0  . escitalopram (LEXAPRO) 20 MG tablet Take 1  tablet (20 mg total) by mouth daily. 30 tablet 3  . MELATONIN PO Take by mouth daily.    . Multiple Vitamin (MULTIVITAMIN) tablet Take 1 tablet by mouth daily.    . pregabalin (LYRICA) 200 MG capsule Take 1 capsule (200 mg total) by mouth 3 (three) times daily. 90 capsule 0   No current facility-administered medications on file prior to visit.     PAST MEDICAL HISTORY: Past Medical History:  Diagnosis Date  . Anemia   . Anxiety   . Arthritis   . Constipation   . Depression   . Fibroids   . 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 Lactose intolerance   . Palpitations   . Shortness of breath   . 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 ABDOMINAL  HYSTERECTOMY  2007  . CESAREAN SECTION  11/19/1989  . 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 SPINE SURGERY  07/13/2015   Dr. Christia Reading  . TUBAL LIGATION  05/09/1993    SOCIAL HISTORY: Social History  Substance Use Topics  . Smoking status: Former Smoker    Years: 1.00    Types: Cigarettes  . Smokeless tobacco: Never Used  . Alcohol use 0.6 oz/week    1 Standard drinks or equivalent per week     Comment: social    FAMILY HISTORY: Family History  Problem Relation Age of Onset  . Diabetes Mother   . Hypertension Mother   . Diabetes Brother   . Hypertension Brother   . Cancer Father   . Endometriosis Daughter   . Diabetes Daughter   . HIV Son     ROS: Review of Systems  Constitutional: Positive for malaise/fatigue and weight loss.  Gastrointestinal: Negative for nausea and vomiting.  Genitourinary: Negative for frequency.  Musculoskeletal:       Negative muscle weakness  Endo/Heme/Allergies: Negative for polydipsia.  Psychiatric/Behavioral: Negative for suicidal ideas.    PHYSICAL EXAM: Blood pressure 112/76, pulse 85, temperature 97.9 F (36.6 C), temperature source Oral, height 5\' 7"  (1.702 m), weight 221 lb (100.2 kg), SpO2 100 %. Body mass index is 34.61 kg/m. Physical Exam  Constitutional: She is oriented to person, place, and time. She appears well-developed and well-nourished.  Cardiovascular: Normal rate.   Pulmonary/Chest: Effort normal.  Musculoskeletal: Normal range of motion.  Neurological: She is oriented to person, place, and time.  Skin: Skin is warm and dry.  Vitals reviewed.   RECENT LABS AND TESTS: BMET    Component Value Date/Time   NA 139 08/24/2016 1018   K 4.8 08/24/2016 1018   CL 98 08/24/2016 1018   CO2 24 08/24/2016 1018   GLUCOSE 103 (H) 08/24/2016 1018   GLUCOSE 103 (H) 07/24/2016 1121   BUN 13 08/24/2016 1018   CREATININE 1.13 (H) 08/24/2016 1018   CREATININE 1.10 07/24/2016 1121   CALCIUM 10.1 08/24/2016  1018   GFRNONAA 58 (L) 08/24/2016 1018   GFRAA 66 08/24/2016 1018   Lab Results  Component Value Date   HGBA1C 6.0 (H) 08/24/2016   Lab Results  Component Value Date   INSULIN 31.7 (H) 08/24/2016   CBC    Component Value Date/Time   WBC 7.5 08/24/2016 1018   WBC 6.1 10/12/2015 1445   RBC 4.77 08/24/2016 1018   RBC 4.85 10/12/2015 1445   HGB 13.7 10/12/2015 1445   HCT 41.2  08/24/2016 1018   PLT 276 08/24/2016 1018   MCV 86 08/24/2016 1018   MCH 28.3 08/24/2016 1018   MCH 28.2 10/12/2015 1445   MCHC 32.8 08/24/2016 1018   MCHC 33.6 10/12/2015 1445   RDW 14.6 08/24/2016 1018   LYMPHSABS 3.5 (H) 08/24/2016 1018   MONOABS 610 10/12/2015 1445   EOSABS 0.2 08/24/2016 1018   BASOSABS 0.0 08/24/2016 1018   Iron/TIBC/Ferritin/ %Sat No results found for: IRON, TIBC, FERRITIN, IRONPCTSAT Lipid Panel     Component Value Date/Time   CHOL 185 08/24/2016 1018   TRIG 127 08/24/2016 1018   HDL 49 08/24/2016 1018   CHOLHDL 3.6 10/12/2015 1445   VLDL 19 10/12/2015 1445   LDLCALC 111 (H) 08/24/2016 1018   Hepatic Function Panel     Component Value Date/Time   PROT 7.6 08/24/2016 1018   ALBUMIN 4.1 08/24/2016 1018   AST 15 08/24/2016 1018   ALT 10 08/24/2016 1018   ALKPHOS 116 08/24/2016 1018   BILITOT <0.2 08/24/2016 1018      Component Value Date/Time   TSH 1.940 08/24/2016 1018   TSH 3.92 07/24/2016 1121   TSH 2.79 10/12/2015 1445    ASSESSMENT AND PLAN: Vitamin D deficiency - Plan: Vitamin D, Ergocalciferol, (DRISDOL) 50000 units CAPS capsule  Other depression - Plan: buPROPion (WELLBUTRIN SR) 150 MG 12 hr tablet  At risk for diabetes mellitus  Class 1 obesity without serious comorbidity with body mass index (BMI) of 34.0 to 34.9 in adult, unspecified obesity type  PLAN:  Vitamin D Deficiency Sabel was informed that low vitamin D levels contributes to fatigue and are associated with obesity, breast, and colon cancer. She agrees to continue to take prescription  Vit D @50 ,000 IU every week #4 with no refills and will follow up for routine testing of vitamin D, at least 2-3 times per year. She was informed of the risk of over-replacement of vitamin D and agrees to not increase her dose unless he discusses this with [place] first. Loreta agrees to follow up with our clinic in 2 weeks.  Depression with Emotional Eating Behaviors We discussed behavior modification techniques today to help Shylee deal with her emotional eating and depression. She has agreed to start to take Wellbutrin SR 150 mg every morning 330 with no refills and continue to take Lexapro as directed and agreed to follow up with our clinic in 2 weeks.  Diabetes risk counselling Luevenia was given extended (at least 30 minutes) diabetes prevention counseling today. She is 51 y.o. female and has risk factors for diabetes including obesity. We discussed intensive lifestyle modifications today with an emphasis on weight loss as well as increasing exercise and decreasing simple carbohydrates in her diet.  Obesity Janie is currently in the action stage of change. As such, her goal is to continue with weight loss efforts She has agreed to follow the Category 2 plan Cary has been instructed to work up to a goal of 150 minutes of combined cardio and strengthening exercise per week for weight loss and overall health benefits. We discussed the following Behavioral Modification Stratagies today: increasing lean protein intake and decreasing simple carbohydrates   Kristilyn has agreed to follow up with our clinic in 2 weeks. She was informed of the importance of frequent follow up visits to maximize her success with intensive lifestyle modifications for her multiple health conditions.  I, Doreene Nest, am acting as scribe for Dennard Nip, MD  I have reviewed the above documentation for accuracy  and completeness, and I agree with the above. -Dennard Nip, MD

## 2016-09-13 ENCOUNTER — Encounter: Payer: Self-pay | Admitting: Physical Therapy

## 2016-09-13 ENCOUNTER — Ambulatory Visit: Payer: 59 | Admitting: Physical Therapy

## 2016-09-13 DIAGNOSIS — G8929 Other chronic pain: Secondary | ICD-10-CM | POA: Diagnosis not present

## 2016-09-13 DIAGNOSIS — R262 Difficulty in walking, not elsewhere classified: Secondary | ICD-10-CM

## 2016-09-13 DIAGNOSIS — M545 Low back pain: Principal | ICD-10-CM

## 2016-09-13 DIAGNOSIS — R252 Cramp and spasm: Secondary | ICD-10-CM

## 2016-09-13 NOTE — Therapy (Signed)
[place] [address] [place], [place], [address] Phone: [phone]   Fax:  [phone]  Physical Therapy Treatment  Patient Details  Name: Sarah Duncan MRN: [id number] Date of Birth: [birth year] Referring Provider: Olga Millers  Encounter Date: 09/13/2016      PT End of Session - 09/13/16 1027    Visit Number 2   Number of Visits 24   Date for PT Re-Evaluation 11/04/16   PT Start Time 0930   PT Stop Time 1026   PT Time Calculation (min) 56 min   Activity Tolerance Patient tolerated treatment well   Behavior During Therapy [facility] for tasks assessed/performed      Past Medical History:  Diagnosis Date  . Anemia   . Anxiety   . Arthritis   . Constipation   . Depression   . Fibroids   . 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 Lactose intolerance   . Palpitations   . Shortness of breath   . 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 ABDOMINAL HYSTERECTOMY  2007  . CESAREAN SECTION  11/19/1989  . 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 SPINE SURGERY  07/13/2015   Dr. Christia Reading  . TUBAL LIGATION  05/09/1993    There were no vitals filed for this visit.      Subjective Assessment - 09/13/16 0936    Subjective Pt reports that she is in alot of pain this morning and rates it as 8 or a 9. Pt. states that she has to sleep sitting up, can't sit for a long period of time or walk long distances due to pain in back.    Limitations Standing;Sitting;House hold activities   How long can you sit comfortably? 45 minutes   How long can you stand comfortably? 10 minutes   How long can you walk comfortably? 10 minutes   Pain Score 9    Pain Location Back   Pain Orientation Mid;Lower                         [facility] Adult PT Treatment/Exercise - 09/13/16 0001      Lumbar Exercises: Aerobic   UBE  (Upper Arm Bike) stationary Bike 5 mins     Lumbar Exercises: Machines for Strengthening   Cybex Knee Flexion 2x10 5lbs   Other Lumbar Machine Exercise Lat pull down 2x10 10 lbs, rows 2x10, 15lbs     Lumbar Exercises: Standing   Row Strengthening;10 reps  2 sets   Theraband Level (Row) Level 2 (Red)   Shoulder Extension Strengthening;10 reps  2 sets   Theraband Level (Shoulder Extension) Level 2 (Red)   Other Standing Lumbar Exercises seated LTR 3x10secs, Seated fwd flexion 3x10 sec   Other Standing Lumbar Exercises standing back extension with ball touch  2x5 reps, seated back extensions with black theraband 2x10      Modalities   Modalities Traction     Traction   Type of Traction Lumbar   Max (lbs) 70   Hold Time 8  asked to stop traction early   Rest Time --   Time --                  PT Short Term Goals - 09/04/16 1137      PT SHORT TERM GOAL #1  Title independent with initial HEP   Time 2   Period Weeks   Status New           PT Long Term Goals - 09/04/16 1137      PT LONG TERM GOAL #1   Title understand posture and body mechanics   Time 8   Period Weeks   Status New     PT LONG TERM GOAL #2   Title decrease pain 50%   Time 8   Period Weeks   Status New     PT LONG TERM GOAL #3   Title increase lumbar ROM 50%   Time 8   Period Weeks   Status New     PT LONG TERM GOAL #4   Title tolerate grocery shopping without increase of pain   Time 8   Period Weeks   Status New     PT LONG TERM GOAL #5   Title increase LE strength to 4/5   Time 8   Period Weeks   Status New               Plan - 09/13/16 1032    Clinical Impression Statement Pt completed all exercises. Pt did not enjoy warmup on stationary back. Reported that it increased her low back pain alot. Pt wasn't able to lay on back for low back stretches. Switched to seated lumbar stretches. Pt had limited ROM when doing hamstring curls due to increased back pain. Pt  completed all exercises but had alot of increased back pain during all exercises today. Tried traction today. Pt said back pain decreased when legs were elevated on stool. Pt stopped traction early and reported that pain was moving down into her bottom and she wasn't comfortable with that. Pt did not enjoy traction but said she will do whatever she has to do.    Rehab Potential Fair   PT Frequency 2x / week   PT Duration 8 weeks   PT Treatment/Interventions ADLs/Self Care Home Management;Cryotherapy;Electrical Stimulation;Moist Heat;Therapeutic exercise;Therapeutic activities;Functional mobility training;Ultrasound;Traction;Neuromuscular re-education;Patient/family education;Manual techniques   PT Next Visit Plan ask if tractioned helped, ask about using traction again, Work on lumbar stretching and strengthening.       Patient will benefit from skilled therapeutic intervention in order to improve the following deficits and impairments:  Decreased mobility, Decreased range of motion, Difficulty walking, Decreased strength, Impaired flexibility, Increased muscle spasms, Pain, Improper body mechanics  Visit Diagnosis: Chronic bilateral low back pain without sciatica  Difficulty in walking, not elsewhere classified  Cramp and spasm     Problem List Patient Active Problem List   Diagnosis Date Noted  . BMI 34.0-34.9,adult 11/25/2015  . Depression 11/23/2015  . 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 Chronic lower back pain 06/18/2015    Sarah Duncan 09/13/2016, 10:53 AM  [address] [place], [place], [address] Phone: [phone]   Fax:  [phone]  Name: Sarah Duncan MRN: [id number] Date of Birth: 1966/05/22

## 2016-09-20 ENCOUNTER — Ambulatory Visit: Payer: 59 | Admitting: Physical Therapy

## 2016-09-26 ENCOUNTER — Ambulatory Visit: Payer: 59 | Attending: Orthopaedic Surgery | Admitting: Physical Therapy

## 2016-09-26 ENCOUNTER — Telehealth (INDEPENDENT_AMBULATORY_CARE_PROVIDER_SITE_OTHER): Payer: Self-pay | Admitting: Orthopaedic Surgery

## 2016-09-26 DIAGNOSIS — M545 Low back pain, unspecified: Secondary | ICD-10-CM

## 2016-09-26 DIAGNOSIS — G8929 Other chronic pain: Secondary | ICD-10-CM

## 2016-09-26 DIAGNOSIS — R252 Cramp and spasm: Secondary | ICD-10-CM | POA: Diagnosis not present

## 2016-09-26 DIAGNOSIS — R262 Difficulty in walking, not elsewhere classified: Secondary | ICD-10-CM | POA: Diagnosis not present

## 2016-09-26 NOTE — Therapy (Signed)
[facility] [place], [place], [address] Phone: [phone]   Fax:  [phone]  Physical Therapy Treatment  Patient Details  Name: Sarah Duncan MRN: [id number] Date of Birth: [birth year] Referring Provider: Olga Millers  Encounter Date: 09/26/2016      PT End of Session - 09/26/16 0935    Visit Number 3   Number of Visits 24   Date for PT Re-Evaluation 11/04/16   PT Start Time 0929   PT Stop Time 1027   PT Time Calculation (min) 58 min   Activity Tolerance Patient tolerated treatment well   Behavior During Therapy [facility] for tasks assessed/performed      Past Medical History:  Diagnosis Date  . Anemia   . Anxiety   . Arthritis   . Constipation   . Depression   . Fibroids   . 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 Lactose intolerance   . Palpitations   . Shortness of breath   . 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 ABDOMINAL HYSTERECTOMY  2007  . CESAREAN SECTION  11/19/1989  . 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 SPINE SURGERY  07/13/2015   Dr. Christia Reading  . TUBAL LIGATION  05/09/1993    There were no vitals filed for this visit.      Subjective Assessment - 09/26/16 0928    Subjective Pt. reporting she did not like traction and didn't feel any benefit from this    Patient Stated Goals have less pain   Currently in Pain? Yes   Pain Score 9    Pain Location Back   Pain Orientation Lower;Right   Pain Descriptors / Indicators Constant   Pain Type Chronic pain   Pain Onset More than a month ago   Pain Frequency Constant   Aggravating Factors  sitting, Walking    Pain Relieving Factors Nothing             [facility] PT Assessment - 09/26/16 1025      Assessment   Medical Diagnosis LBP/DDD   Referring Provider M. Yates   Onset Date/Surgical Date 07/07/16   Next MD Visit ~  6.13.18   Prior Therapy yes with little changes                     [facility] Adult PT Treatment/Exercise - 09/26/16 0933      Lumbar Exercises: Stretches   Passive Hamstring Stretch 2 reps;30 seconds  bil; seated with strap    Piriformis Stretch 2 reps;30 seconds  Bil; Seated with LE on stool      Lumbar Exercises: Aerobic   UBE (Upper Arm Bike) NuStep: lvl 4, 8 min      Knee/Hip Exercises: Standing   Hip Abduction Right;Left;Stengthening;1 set;10 reps;Knee straight   Abduction Limitations holding onto TM; increased pain    Hip Extension Right;Left;Stengthening;1 set;Knee straight;5 reps   Extension Limitations at TM; noted significant increase in pain thus terminated     Knee/Hip Exercises: Seated   Other Seated Knee/Hip Exercises Seated abdominal bracing 5" x 10 reps; with cueing for upright posture and neutral lumbar spine     Marching AROM;Right;Left;Strengthening;1 set;15 reps   Marching Limitations with cueing for abdominal bracing; no increase of pain with this      Moist Heat Therapy   Number Minutes Moist  Heat 15 Minutes   Moist Heat Location Lumbar Spine     Electrical Stimulation   Electrical Stimulation Location Lumbar area   Electrical Stimulation Action IFC, 15 min    Electrical Stimulation Parameters Sitting    Electrical Stimulation Goals Pain                PT Education - 09/26/16 1003    Education provided Yes   Education Details Seated abdominal bracing, Seated HS stretch, piriformis stretch, calf stretch   Person(s) Educated Patient   Methods Explanation;Demonstration;Verbal cues;Handout   Comprehension Verbalized understanding;Verbal cues required;Returned demonstration;Need further instruction          PT Short Term Goals - 09/26/16 0936      PT SHORT TERM GOAL #1   Title independent with initial HEP   Time 2   Period Weeks   Status On-going           PT Long Term Goals - 09/26/16 0936      PT LONG TERM GOAL #1    Title understand posture and body mechanics   Time 8   Period Weeks   Status On-going     PT LONG TERM GOAL #2   Title decrease pain 50%   Time 8   Period Weeks   Status On-going     PT LONG TERM GOAL #3   Title increase lumbar ROM 50%   Time 8   Period Weeks   Status On-going     PT LONG TERM GOAL #4   Title tolerate grocery shopping without increase of pain   Time 8   Period Weeks   Status On-going     PT LONG TERM GOAL #5   Title increase LE strength to 4/5   Time 8   Period Weeks   Status On-going               Plan - 09/26/16 1024    Clinical Impression Statement Pt. noting no benefit from traction last treatment however complained more of positioning on back during traction than treatment itself.  Pt. noting she would be open to trying traction again.  High reported LBP levels to start therapy, which remained high throughout therex today.  Able to perform LE stretching in seated position today with only mild increase in pain thus added to HEP.  Standing calf stretch and seated abdominal brace tolerated well today thus added to HEP.  Reported pain levels not consistent with pt. actions and performance of therex today.  May try traction again and progress lumbar stretching and strengthening in coming visits.     PT Treatment/Interventions ADLs/Self Care Home Management;Cryotherapy;Electrical Stimulation;Moist Heat;Therapeutic exercise;Therapeutic activities;Functional mobility training;Ultrasound;Traction;Neuromuscular re-education;Patient/family education;Manual techniques   PT Next Visit Plan Ask about using traction again, continue Work on lumbar stretching and strengthening.       Patient will benefit from skilled therapeutic intervention in order to improve the following deficits and impairments:  Decreased mobility, Decreased range of motion, Difficulty walking, Decreased strength, Impaired flexibility, Increased muscle spasms, Pain, Improper body  mechanics  Visit Diagnosis: Chronic bilateral low back pain without sciatica  Difficulty in walking, not elsewhere classified  Cramp and spasm     Problem List Patient Active Problem List   Diagnosis Date Noted  . BMI 34.0-34.9,adult 11/25/2015  . Depression 11/23/2015  . 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 Chronic lower back pain 06/18/2015   Bess Harvest, PTA 09/26/16 10:45 AM  [facility] [phone]  [facility] [place], [place], [address] Phone: [phone]   Fax:  [phone]  Name: Sarah Duncan MRN: [id number] Date of Birth: 03/20/1966

## 2016-09-26 NOTE — Telephone Encounter (Signed)
08/23/2016 OV NOTE FAXED RELIANCE STANDARD C/O RP [phone]

## 2016-09-27 ENCOUNTER — Ambulatory Visit (INDEPENDENT_AMBULATORY_CARE_PROVIDER_SITE_OTHER): Payer: 59 | Admitting: Family Medicine

## 2016-09-27 VITALS — BP 112/73 | HR 85 | Temp 98.0°F | Ht 67.0 in | Wt 224.0 lb

## 2016-09-27 DIAGNOSIS — F3289 Other specified depressive episodes: Secondary | ICD-10-CM | POA: Diagnosis not present

## 2016-09-27 DIAGNOSIS — Z6835 Body mass index (BMI) 35.0-35.9, adult: Secondary | ICD-10-CM | POA: Diagnosis not present

## 2016-09-27 DIAGNOSIS — E669 Obesity, unspecified: Secondary | ICD-10-CM

## 2016-09-28 NOTE — Progress Notes (Signed)
Office: [phone]  /  Fax: [phone]   HPI:   Chief Complaint: OBESITY Sarah Duncan is here to discuss her progress with her obesity treatment plan. She is on the  follow the Category 2 plan and is following her eating plan approximately 75 % of the time. She states she is exercising 60 minutes 1 time per week. Sarah Duncan increased travelling and eating out. She gained weight and is frustrated that she didn't lose weight even though she didn't follow her plan. She would like to discuss other diet options. Her weight is 224 lb (101.6 kg) today and has had a weight gain of 3 pounds over a period of 2 weeks since her last visit. She has lgained 3 lbs since starting treatment with [place].  Depression with emotional eating behaviors Sarah Duncan started Wellbutrin and noted that she is struggling with emotional eating. She is using food for comfort to the extent that it is negatively impacting her health. She often snacks when she is not hungry. Sarah Duncan sometimes feels she is out of control and then feels guilty that she made poor food choices. She is sabotaging herself by surrounding herself with her favorite candy and is still mindlessly eating. She has been working on behavior modification techniques to help reduce her emotional eating and has been somewhat successful. She shows no sign of suicidal or homicidal ideations.  Depression screen Sarah Duncan 2/9 08/24/2016 07/18/2016 05/23/2016 04/11/2016 03/30/2016  Decreased Interest 1 0 0 3 0  Down, Depressed, Hopeless 2 0 0 2 2  PHQ - 2 Score 3 0 0 5 2  Altered sleeping 0 - - 3 3  Tired, decreased energy 1 - - 3 3  Change in appetite 1 - - 3 0  Feeling bad or failure about yourself  0 - - 3 3  Trouble concentrating 0 - - 3 3  Moving slowly or fidgety/restless 0 - - 2 3  Suicidal thoughts 0 - - 0 0  PHQ-9 Score 5 - - 22 17  Difficult doing work/chores - - - Very difficult Very difficult     Wt Readings from Last 500 Encounters:  09/27/16 224 lb (101.6 kg)  09/12/16 221  lb (100.2 kg)  08/24/16 224 lb (101.6 kg)  08/23/16 220 lb (99.8 kg)  07/24/16 220 lb (99.8 kg)  07/18/16 224 lb (101.6 kg)  05/23/16 224 lb (101.6 kg)  05/03/16 225 lb (102.1 kg)  04/12/16 223 lb (101.2 kg)  04/11/16 223 lb 12.8 oz (101.5 kg)  03/30/16 229 lb (103.9 kg)  03/03/16 224 lb (101.6 kg)  11/25/15 206 lb (93.4 kg)  11/25/15 206 lb (93.4 kg)  10/12/15 207 lb 9.6 oz (94.2 kg)  08/15/15 201 lb (91.2 kg)     ALLERGIES: No Known Allergies  MEDICATIONS: Current Outpatient Prescriptions on File Prior to Visit  Medication Sig Dispense Refill  . acetaminophen (TYLENOL) 650 MG CR tablet Take 650 mg by mouth every 8 (eight) hours as needed for pain.    Marland Kitchen BIOTIN PO Take by mouth daily.    Marland Kitchen buPROPion (WELLBUTRIN SR) 150 MG 12 hr tablet Take 1 tablet (150 mg total) by mouth daily. 30 tablet 0  . cyclobenzaprine (FLEXERIL) 10 MG tablet Take 0.5-1 tablets (5-10 mg total) by mouth 3 (three) times daily as needed for muscle spasms. (Patient taking differently: Take 5-10 mg by mouth daily. ) 90 tablet 0  . escitalopram (LEXAPRO) 20 MG tablet Take 1 tablet (20 mg total) by mouth daily. 30 tablet 3  .  MELATONIN PO Take by mouth daily.    . Multiple Vitamin (MULTIVITAMIN) tablet Take 1 tablet by mouth daily.    . pregabalin (LYRICA) 200 MG capsule Take 1 capsule (200 mg total) by mouth 3 (three) times daily. 90 capsule 0  . Vitamin D, Ergocalciferol, (DRISDOL) 50000 units CAPS capsule Take 1 capsule (50,000 Units total) by mouth every 7 (seven) days. 4 capsule 0   No current facility-administered medications on file prior to visit.     PAST MEDICAL HISTORY: Past Medical History:  Diagnosis Date  . Anemia   . Anxiety   . Arthritis   . Constipation   . Depression   . Fibroids   . 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 Lactose intolerance   . Palpitations   . Shortness of breath   . 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 ABDOMINAL HYSTERECTOMY  2007  . CESAREAN SECTION  11/19/1989  . 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 SPINE SURGERY  07/13/2015   Dr. Christia Reading  . TUBAL LIGATION  05/09/1993    SOCIAL HISTORY: Social History  Substance Use Topics  . Smoking status: Former Smoker    Years: 1.00    Types: Cigarettes  . Smokeless tobacco: Never Used  . Alcohol use 0.6 oz/week    1 Standard drinks or equivalent per week     Comment: social    FAMILY HISTORY: Family History  Problem Relation Age of Onset  . Diabetes Mother   . Hypertension Mother   . Diabetes Brother   . Hypertension Brother   . Cancer Father   . Endometriosis Daughter   . Diabetes Daughter   . HIV Son     ROS: Review of Systems  Constitutional: Negative for weight loss.  Psychiatric/Behavioral: Positive for depression. Negative for suicidal ideas.    PHYSICAL EXAM: Blood pressure 112/73, pulse 85, temperature 98 F (36.7 C), temperature source Oral, height 5\' 7"  (1.702 m), weight 224 lb (101.6 kg), SpO2 97 %. Body mass index is 35.08 kg/m. Physical Exam  Constitutional: She is oriented to person, place, and time. She appears well-developed and well-nourished.  Cardiovascular: Normal rate.   Pulmonary/Chest: Effort normal.  Musculoskeletal: Normal range of motion.  Neurological: She is oriented to person, place, and time.  Skin: Skin is warm and dry.  Vitals reviewed.   RECENT LABS AND TESTS: BMET    Component Value Date/Time   NA 139 08/24/2016 1018   K 4.8 08/24/2016 1018   CL 98 08/24/2016 1018   CO2 24 08/24/2016 1018   GLUCOSE 103 (H) 08/24/2016 1018   GLUCOSE 103 (H) 07/24/2016 1121   BUN 13 08/24/2016 1018   CREATININE 1.13 (H) 08/24/2016 1018   CREATININE 1.10 07/24/2016 1121   CALCIUM 10.1 08/24/2016 1018   GFRNONAA 58 (L) 08/24/2016 1018   GFRAA 66 08/24/2016 1018   Lab Results  Component Value Date   HGBA1C  6.0 (H) 08/24/2016   Lab Results  Component Value Date   INSULIN 31.7 (H) 08/24/2016   CBC    Component Value Date/Time   WBC 7.5 08/24/2016 1018   WBC 6.1 10/12/2015 1445   RBC 4.77 08/24/2016 1018   RBC 4.85 10/12/2015 1445   HGB 13.7 10/12/2015 1445   HCT 41.2 08/24/2016 1018   PLT 276 08/24/2016 1018   MCV 86 08/24/2016 1018  MCH 28.3 08/24/2016 1018   MCH 28.2 10/12/2015 1445   MCHC 32.8 08/24/2016 1018   MCHC 33.6 10/12/2015 1445   RDW 14.6 08/24/2016 1018   LYMPHSABS 3.5 (H) 08/24/2016 1018   MONOABS 610 10/12/2015 1445   EOSABS 0.2 08/24/2016 1018   BASOSABS 0.0 08/24/2016 1018   Iron/TIBC/Ferritin/ %Sat No results found for: IRON, TIBC, FERRITIN, IRONPCTSAT Lipid Panel     Component Value Date/Time   CHOL 185 08/24/2016 1018   TRIG 127 08/24/2016 1018   HDL 49 08/24/2016 1018   CHOLHDL 3.6 10/12/2015 1445   VLDL 19 10/12/2015 1445   LDLCALC 111 (H) 08/24/2016 1018   Hepatic Function Panel     Component Value Date/Time   PROT 7.6 08/24/2016 1018   ALBUMIN 4.1 08/24/2016 1018   AST 15 08/24/2016 1018   ALT 10 08/24/2016 1018   ALKPHOS 116 08/24/2016 1018   BILITOT <0.2 08/24/2016 1018      Component Value Date/Time   TSH 1.940 08/24/2016 1018   TSH 3.92 07/24/2016 1121   TSH 2.79 10/12/2015 1445    ASSESSMENT AND PLAN: No diagnosis found.  PLAN:  Depression with Emotional Eating Behaviors We discussed behavior modification techniques today to help Sarah Duncan deal with her emotional eating and depression. She was encouraged to continue  Wellbutrin SR 150 mg qd but to move tempting food out of easy access so as to not sabotage herself and she agreed to follow up as directed.  We spent > than 50% of the 15 minute visit on the counseling as documented in the note.  Obesity Sarah Duncan is currently in the action stage of change. As such, her goal is to continue with weight loss efforts She has agreed to keep a food journal with 400 to 600 calories and 35+  grams of protein at dinner daily and follow the Category 2 plan. We discussed 4 other options. Sarah Duncan decided to continue with category 2 plan with some dinner journaling. Sarah Duncan has been instructed to work up to a goal of 150 minutes of combined cardio and strengthening exercise per week for weight loss and overall health benefits. We discussed the following Behavioral Modification Stratagies today: increasing lean protein intake, work on meal planning and easy cooking plans and avoiding temptations  Sarah Duncan has agreed to follow up with our clinic in 2 weeks. She was informed of the importance of frequent follow up visits to maximize her success with intensive lifestyle modifications for her multiple health conditions.  I, Doreene Nest, am acting as scribe for Dennard Nip, MD  I have reviewed the above documentation for accuracy and completeness, and I agree with the above. -Dennard Nip, MD

## 2016-10-02 ENCOUNTER — Encounter: Payer: Self-pay | Admitting: Physical Therapy

## 2016-10-02 ENCOUNTER — Ambulatory Visit: Payer: 59 | Admitting: Physical Therapy

## 2016-10-02 DIAGNOSIS — M545 Low back pain: Secondary | ICD-10-CM | POA: Diagnosis not present

## 2016-10-02 DIAGNOSIS — R262 Difficulty in walking, not elsewhere classified: Secondary | ICD-10-CM

## 2016-10-02 DIAGNOSIS — G8929 Other chronic pain: Secondary | ICD-10-CM | POA: Diagnosis not present

## 2016-10-02 DIAGNOSIS — R252 Cramp and spasm: Secondary | ICD-10-CM

## 2016-10-02 NOTE — Therapy (Signed)
[facility] [place], [place], [address] Phone: [phone]   Fax:  [phone]  Physical Therapy Treatment  Patient Details  Name: Sarah Duncan MRN: [id number] Date of Birth: Oct 10, 1965 Referring Provider: Olga Millers  Encounter Date: 10/02/2016      PT End of Session - 10/02/16 1001    Visit Number 4   Number of Visits 24   Date for PT Re-Evaluation 11/04/16   PT Start Time 0930   PT Stop Time 1012   PT Time Calculation (min) 42 min   Activity Tolerance Patient tolerated treatment well   Behavior During Therapy [facility] for tasks assessed/performed      Past Medical History:  Diagnosis Date  . Anemia   . Anxiety   . Arthritis   . Constipation   . Depression   . Fibroids   . 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 Lactose intolerance   . Palpitations   . Shortness of breath   . 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 ABDOMINAL HYSTERECTOMY  2007  . CESAREAN SECTION  11/19/1989  . 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 SPINE SURGERY  07/13/2015   Dr. Christia Reading  . TUBAL LIGATION  05/09/1993    There were no vitals filed for this visit.      Subjective Assessment - 10/02/16 0933    Subjective Pt reports that she is always in pain, "He did a lot of leg exercises so my legs were burning after last time."   Currently in Pain? Yes   Pain Score 8    Pain Location Back   Pain Orientation Lower;Right                         [facility] Adult PT Treatment/Exercise - 10/02/16 0001      Lumbar Exercises: Aerobic   UBE (Upper Arm Bike) NuStep: lvl 4, 8 min      Lumbar Exercises: Machines for Strengthening   Other Lumbar Machine Exercise Lat pull down 2x10 20 lbs, rows 2x10, [id number]     Lumbar Exercises: Standing   Row Strengthening;10 reps;Both;Theraband  x2   Theraband  Level (Row) Level 4 (Blue)   Shoulder Extension Strengthening;10 reps;Both;Theraband  x2   Theraband Level (Shoulder Extension) Level 4 (Blue)     Modalities   Modalities Traction;Moist Heat     Moist Heat Therapy   Number Minutes Moist Heat 15 Minutes   Moist Heat Location Lumbar Spine     Traction   Type of Traction Lumbar   Max (lbs) 85   Time 15                  PT Short Term Goals - 09/26/16 0936      PT SHORT TERM GOAL #1   Title independent with initial HEP   Time 2   Period Weeks   Status On-going           PT Long Term Goals - 09/26/16 0936      PT LONG TERM GOAL #1   Title understand posture and body mechanics   Time 8   Period Weeks   Status On-going     PT LONG TERM GOAL #2   Title decrease pain 50%   Time 8   Period Weeks  Status On-going     PT LONG TERM GOAL #3   Title increase lumbar ROM 50%   Time 8   Period Weeks   Status On-going     PT LONG TERM GOAL #4   Title tolerate grocery shopping without increase of pain   Time 8   Period Weeks   Status On-going     PT LONG TERM GOAL #5   Title increase LE strength to 4/5   Time 8   Period Weeks   Status On-going               Plan - 10/02/16 1001    Clinical Impression Statement Pt performed all postural strengthening exercises well, does report that she could feel her back with the shoulder extensions. Attempted traction again, pt reports that it was painful but able to push through   Rehab Potential Fair   PT Frequency 2x / week   PT Duration 8 weeks   PT Treatment/Interventions ADLs/Self Care Home Management;Cryotherapy;Electrical Stimulation;Moist Heat;Therapeutic exercise;Therapeutic activities;Functional mobility training;Ultrasound;Traction;Neuromuscular re-education;Patient/family education;Manual techniques   PT Next Visit Plan Ask about using traction again, continue Work on lumbar stretching and strengthening.       Patient will benefit from skilled  therapeutic intervention in order to improve the following deficits and impairments:  Decreased mobility, Decreased range of motion, Difficulty walking, Decreased strength, Impaired flexibility, Increased muscle spasms, Pain, Improper body mechanics  Visit Diagnosis: Chronic bilateral low back pain without sciatica  Difficulty in walking, not elsewhere classified  Cramp and spasm     Problem List Patient Active Problem List   Diagnosis Date Noted  . BMI 34.0-34.9,adult 11/25/2015  . Depression 11/23/2015  . 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 Chronic lower back pain 06/18/2015    Scot Jun, PTA 10/02/2016, 10:16 AM  [facility] [place], [place], [address] Phone: [phone]   Fax:  [phone]  Name: Sarah Duncan MRN: [id number] Date of Birth: [birth year]

## 2016-10-03 ENCOUNTER — Encounter: Payer: Self-pay | Admitting: Physician Assistant

## 2016-10-03 ENCOUNTER — Ambulatory Visit (INDEPENDENT_AMBULATORY_CARE_PROVIDER_SITE_OTHER): Payer: 59 | Admitting: Physician Assistant

## 2016-10-03 VITALS — BP 110/82 | HR 78 | Temp 98.0°F | Resp 16 | Ht 68.0 in | Wt 226.6 lb

## 2016-10-03 DIAGNOSIS — F3289 Other specified depressive episodes: Secondary | ICD-10-CM | POA: Diagnosis not present

## 2016-10-03 DIAGNOSIS — G8929 Other chronic pain: Secondary | ICD-10-CM

## 2016-10-03 DIAGNOSIS — Z6834 Body mass index (BMI) 34.0-34.9, adult: Secondary | ICD-10-CM

## 2016-10-03 DIAGNOSIS — M545 Low back pain: Secondary | ICD-10-CM

## 2016-10-03 DIAGNOSIS — Z905 Acquired absence of kidney: Secondary | ICD-10-CM | POA: Diagnosis not present

## 2016-10-03 MED ORDER — CYCLOBENZAPRINE HCL 10 MG PO TABS: 5.0000 mg | ORAL_TABLET | Freq: Three times a day (TID) | ORAL | 0 refills | Status: DC | PRN

## 2016-10-03 MED ORDER — PREGABALIN 200 MG PO CAPS: 200.0000 mg | ORAL_CAPSULE | Freq: Three times a day (TID) | ORAL | 0 refills | Status: DC

## 2016-10-03 MED FILL — CYCLOBENZAPRINE 10 MG TAB: 10 | 30 days supply | Qty: 90 | Fill #0

## 2016-10-03 NOTE — Progress Notes (Signed)
Patient ID: Sarah Duncan, female    DOB: 19-Jul-1965, 51 y.o.   MRN: [id number]  PCP: Harrison Mons, PA-C  Chief Complaint  Patient presents with  . check kidneys  . Medication Refill    Cyclobenzaprine 10 mg, Lyrica 200 mg  . Back Pain    lower back    Subjective:   Presents for evaluation of renal function and medication refills. She is accompanied by her wife.  She is going to PT, though it hurts and isn't seeming to help her chronic back pain. Is seeing Dr. Leafy Ro at the [facility], but hasn't seen much success yet, thought due to her not following the agreed upon plan.  No nausea, vomiting or diarrhea. No CP, SOB, HA, dizziness.  She asks me what is required in order to have her pet dog made into an emotional support dog. She thinks it would be helpful to have the dog with her, as she finds she is less depressed and anxious when she is accompanied by her dog.    Review of Systems As above.    Patient Active Problem List   Diagnosis Date Noted  . BMI 34.0-34.9,adult 11/25/2015  . Depression 11/23/2015  . 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 Chronic lower back pain 06/18/2015     Prior to Admission medications   Medication Sig Start Date End Date Taking? Authorizing Provider  BIOTIN PO Take by mouth daily.   Yes [provider]  buPROPion (WELLBUTRIN SR) 150 MG 12 hr tablet Take 1 tablet (150 mg total) by mouth daily. 09/12/16  Yes Beasley, Caren D, MD  cyclobenzaprine (FLEXERIL) 10 MG tablet Take 0.5-1 tablets (5-10 mg total) by mouth 3 (three) times daily as needed for muscle spasms. Patient taking differently: Take 5-10 mg by mouth daily.  06/05/16  Yes Abbagayle Zaragoza, PA-C  escitalopram (LEXAPRO) 20 MG tablet Take 1 tablet (20 mg total) by mouth daily. 04/11/16  Yes Yecenia Dalgleish, PA-C  MELATONIN PO Take by mouth daily.   Yes [provider]  Multiple Vitamin (MULTIVITAMIN) tablet Take 1 tablet by mouth  daily.   Yes [provider]  pregabalin (LYRICA) 200 MG capsule Take 1 capsule (200 mg total) by mouth 3 (three) times daily. 07/18/16  Yes Pairlee Sawtell, PA-C  Vitamin D, Ergocalciferol, (DRISDOL) 50000 units CAPS capsule Take 1 capsule (50,000 Units total) by mouth every 7 (seven) days. 09/12/16  Yes Dennard Nip D, MD  acetaminophen (TYLENOL) 650 MG CR tablet Take 650 mg by mouth every 8 (eight) hours as needed for pain.    [provider]     No Known Allergies     Objective:  Physical Exam  Constitutional: She is oriented to person, place, and time. She appears well-developed and well-nourished. She is active and cooperative. No distress.  BP 110/82   Pulse 78   Temp 98 F (36.7 C) (Oral)   Resp 16   Ht 5\' 8"  (1.727 m)   Wt 226 lb 9.6 oz (102.8 kg)   SpO2 98%   BMI 34.45 kg/m   HENT:  Head: Normocephalic and atraumatic.  Right Ear: Hearing normal.  Left Ear: Hearing normal.  Eyes: Conjunctivae are normal. No scleral icterus.  Neck: Normal range of motion. Neck supple. No thyromegaly present.  Cardiovascular: Normal rate, regular rhythm and normal heart sounds.   Pulses:      Radial pulses are 2+ on the right side, and 2+ on the left side.  Pulmonary/Chest: Effort normal and breath sounds normal.  Musculoskeletal:       Lumbar back: She exhibits decreased range of motion, tenderness, bony tenderness and pain. She exhibits no swelling, no edema, no deformity, no laceration and no spasm.  Lymphadenopathy:       Head (right side): No tonsillar, no preauricular, no posterior auricular and no occipital adenopathy present.       Head (left side): No tonsillar, no preauricular, no posterior auricular and no occipital adenopathy present.    She has no cervical adenopathy.       Right: No supraclavicular adenopathy present.       Left: No supraclavicular adenopathy present.  Neurological: She is alert and oriented to person, place, and time. No sensory  deficit.  Skin: Skin is warm, dry and intact. No rash noted. No cyanosis or erythema. Nails show no clubbing.  Psychiatric: She has a normal mood and affect. Her speech is normal and behavior is normal.           Assessment & Plan:   Problem List Items Addressed This Visit    Chronic lower back pain    Encouraged her to communicate with Dr. Lorin Mercy and the PT what pain she is having so that her routine and treatment can address it.      Relevant Medications   cyclobenzaprine (FLEXERIL) 10 MG tablet   pregabalin (LYRICA) 200 MG capsule   Single kidney - Primary    Update renal function testing.      Relevant Orders   Basic metabolic panel (Completed)   Care order/instruction:   Depression    She and I will explore requirements of emotional support animals.      BMI 34.0-34.9,adult    Encouraged her to continue work to stick with the agreed-upon plan with Dr. Leafy Ro.          Return in about 3 months (around 01/03/2017) for re-evaluation of kidney function, mood.   Fara Chute, PA-C [facility]

## 2016-10-03 NOTE — Patient Instructions (Signed)
     IF you received an x-ray today, you will receive an invoice from [facility]. Please contact [facility] at [phone] with questions or concerns regarding your invoice.   IF you received labwork today, you will receive an invoice from [facility]. Please contact [facility] at [phone] with questions or concerns regarding your invoice.   Our billing staff will not be able to assist you with questions regarding bills from these companies.  You will be contacted with the lab results as soon as they are available. The fastest way to get your results is to activate your My Chart account. Instructions are located on the last page of this paperwork. If you have not heard from us regarding the results in 2 weeks, please contact this office.     

## 2016-10-04 LAB — BASIC METABOLIC PANEL
BUN / CREAT RATIO: 10 (ref 9–23)
BUN: 10 mg/dL (ref 6–24)
CHLORIDE: 104 mmol/L (ref 96–106)
CO2: 23 mmol/L (ref 18–29)
CREATININE: 1 mg/dL (ref 0.57–1.00)
Calcium: 9.5 mg/dL (ref 8.7–10.2)
GFR calc non Af Amer: 67 mL/min/{1.73_m2} (ref >59)
GFR, EST AFRICAN AMERICAN: 77 mL/min/{1.73_m2} (ref >59)
Glucose: 92 mg/dL (ref 65–99)
Potassium: 4.4 mmol/L (ref 3.5–5.2)
Sodium: 141 mmol/L (ref 134–144)

## 2016-10-05 MED FILL — ESCITALOPRAM 20 MG TABLET: 20 | 30 days supply | Qty: 30 | Fill #3

## 2016-10-05 MED FILL — LYRICA 200 MG CAPSULE: 200 | 30 days supply | Qty: 90 | Fill #0

## 2016-10-06 ENCOUNTER — Telehealth: Payer: Self-pay | Admitting: Family Medicine

## 2016-10-06 NOTE — Assessment & Plan Note (Signed)
She and I will explore requirements of emotional support animals.

## 2016-10-06 NOTE — Assessment & Plan Note (Signed)
Encouraged her to continue work to stick with the agreed-upon plan with Dr. Leafy Ro.

## 2016-10-06 NOTE — Telephone Encounter (Signed)
LMOM TO CALL AND RESCHEDULE PT APPOINTMENT THAT SHE HAD WITH CHELLE ON 01/05/17 [facility] WILL BE OUT OF THE OFFICE THAT DAY IT WAS FOR 3 MONTH F/U FOR KIDNEYS AND MOOD

## 2016-10-06 NOTE — Assessment & Plan Note (Signed)
Encouraged her to communicate with Dr. Lorin Mercy and the PT what pain she is having so that her routine and treatment can address it.

## 2016-10-06 NOTE — Assessment & Plan Note (Signed)
Update renal function testing.

## 2016-10-10 ENCOUNTER — Ambulatory Visit: Payer: 59 | Admitting: Physical Therapy

## 2016-10-10 ENCOUNTER — Encounter: Payer: Self-pay | Admitting: Physical Therapy

## 2016-10-10 DIAGNOSIS — R262 Difficulty in walking, not elsewhere classified: Secondary | ICD-10-CM

## 2016-10-10 DIAGNOSIS — M545 Low back pain, unspecified: Secondary | ICD-10-CM

## 2016-10-10 DIAGNOSIS — G8929 Other chronic pain: Secondary | ICD-10-CM | POA: Diagnosis not present

## 2016-10-10 DIAGNOSIS — R252 Cramp and spasm: Secondary | ICD-10-CM | POA: Diagnosis not present

## 2016-10-10 NOTE — Therapy (Signed)
[place], [place], [address] Phone: [phone]   Fax:  [phone]  Physical Therapy Treatment  Patient Details  Name: Sarah Duncan MRN: [id number] Date of Birth: [birth year] Referring Provider: Olga Millers  Encounter Date: 10/10/2016      PT End of Session - 10/10/16 0923    Visit Number 5   Number of Visits 24   Date for PT Re-Evaluation 11/04/16   PT Start Time 0848   PT Stop Time 0936   PT Time Calculation (min) 48 min   Activity Tolerance Patient limited by pain;Patient tolerated treatment well   Behavior During Therapy [facility] for tasks assessed/performed      Past Medical History:  Diagnosis Date  . Anemia   . Anxiety   . Arthritis   . Constipation   . Depression   . Fibroids   . 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 Lactose intolerance   . Palpitations   . Shortness of breath   . 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 ABDOMINAL HYSTERECTOMY  2007  . CESAREAN SECTION  11/19/1989  . 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 SPINE SURGERY  07/13/2015   Dr. Christia Reading  . TUBAL LIGATION  05/09/1993    There were no vitals filed for this visit.      Subjective Assessment - 10/10/16 0848    Subjective "No traction today, I was in the bed for two days afterwards"   Currently in Pain? Yes   Pain Score 6    Pain Location Back   Pain Orientation Left;Right                         [facility] Adult PT Treatment/Exercise - 10/10/16 0001      Lumbar Exercises: Aerobic   UBE (Upper Arm Bike) L3 3 frd/ 3 rev     Lumbar Exercises: Machines for Strengthening   Cybex Knee Extension 5lb 2x10    Cybex Knee Flexion 20lb 2x10    Other Lumbar Machine Exercise Lat pull down 2x10 20 lbs, rows 2x10 25lb     Lumbar Exercises: Seated   Sit to Stand 10 reps  x2 from  chair, UE use     Lumbar Exercises: Supine   Other Supine Lumbar Exercises Seated OHP 3lb 2x10   Other Supine Lumbar Exercises Seated march 3lb 2x10      Moist Heat Therapy   Number Minutes Moist Heat 15 Minutes   Moist Heat Location Lumbar Spine     Electrical Stimulation   Electrical Stimulation Location Lumbar area   Electrical Stimulation Action IFC   Electrical Stimulation Parameters sitting   Electrical Stimulation Goals Pain                  PT Short Term Goals - 09/26/16 0936      PT SHORT TERM GOAL #1   Title independent with initial HEP   Time 2   Period Weeks   Status On-going           PT Long Term Goals - 10/10/16 9735      PT LONG TERM GOAL #1   Title understand posture and body mechanics   Status Partially Met     PT LONG TERM GOAL #2   Title decrease pain 50%  Status On-going     PT LONG TERM GOAL #3   Title increase lumbar ROM 50%   Status On-going     PT LONG TERM GOAL #4   Title tolerate grocery shopping without increase of pain   Status On-going     PT LONG TERM GOAL #5   Title increase LE strength to 4/5   Status On-going               Plan - 10/10/16 9629    Clinical Impression Statement Pt reports that she was in the bed for two days after having traction last time. Reports that her pan is only 6/10 pre treatment because she took her machine before coming. Pt able to complete all of today's activities, but has some guarded movement getting on and off the machines. Again pt reports that she is unable to ly on her back, limiting the number of stretches we can attempt.   Rehab Potential Fair   PT Frequency 2x / week   PT Duration 8 weeks   PT Treatment/Interventions ADLs/Self Care Home Management;Cryotherapy;Electrical Stimulation;Moist Heat;Therapeutic exercise;Therapeutic activities;Functional mobility training;Ultrasound;Traction;Neuromuscular re-education;Patient/family education;Manual techniques   PT Next Visit  Plan continue Work on lumbar stretching and strengthening, ask pt to follow up with her MD      Patient will benefit from skilled therapeutic intervention in order to improve the following deficits and impairments:  Decreased mobility, Decreased range of motion, Difficulty walking, Decreased strength, Impaired flexibility, Increased muscle spasms, Pain, Improper body mechanics  Visit Diagnosis: Chronic bilateral low back pain without sciatica  Difficulty in walking, not elsewhere classified  Cramp and spasm     Problem List Patient Active Problem List   Diagnosis Date Noted  . BMI 34.0-34.9,adult 11/25/2015  . Depression 11/23/2015  . 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 Chronic lower back pain 06/18/2015    Scot Jun, PTA 10/10/2016, 9:27 AM  [place], [place], [address] Phone: [phone]   Fax:  [phone]  Name: Sarah Duncan MRN: [id number] Date of Birth: Apr 14, 1966

## 2016-10-11 ENCOUNTER — Ambulatory Visit (INDEPENDENT_AMBULATORY_CARE_PROVIDER_SITE_OTHER): Payer: 59 | Admitting: Family Medicine

## 2016-10-11 ENCOUNTER — Ambulatory Visit: Payer: 59 | Admitting: Physical Therapy

## 2016-10-13 ENCOUNTER — Encounter: Payer: Self-pay | Admitting: Physician Assistant

## 2016-10-14 ENCOUNTER — Encounter: Payer: Self-pay | Admitting: Physician Assistant

## 2016-10-18 ENCOUNTER — Ambulatory Visit (INDEPENDENT_AMBULATORY_CARE_PROVIDER_SITE_OTHER): Payer: 59 | Admitting: Family Medicine

## 2016-10-18 ENCOUNTER — Ambulatory Visit: Payer: 59 | Admitting: Physical Therapy

## 2016-10-18 VITALS — BP 109/75 | Ht 68.0 in | Wt 225.0 lb

## 2016-10-18 DIAGNOSIS — E669 Obesity, unspecified: Secondary | ICD-10-CM

## 2016-10-18 DIAGNOSIS — Z9189 Other specified personal risk factors, not elsewhere classified: Secondary | ICD-10-CM | POA: Diagnosis not present

## 2016-10-18 DIAGNOSIS — R7303 Prediabetes: Secondary | ICD-10-CM | POA: Diagnosis not present

## 2016-10-18 DIAGNOSIS — F3289 Other specified depressive episodes: Secondary | ICD-10-CM | POA: Diagnosis not present

## 2016-10-18 DIAGNOSIS — E559 Vitamin D deficiency, unspecified: Secondary | ICD-10-CM | POA: Diagnosis not present

## 2016-10-18 DIAGNOSIS — Z6834 Body mass index (BMI) 34.0-34.9, adult: Secondary | ICD-10-CM

## 2016-10-18 MED ORDER — VITAMIN D (ERGOCALCIFEROL) 1.25 MG (50000 UNIT) PO CAPS: 50000.0000 [IU] | ORAL_CAPSULE | ORAL | 0 refills | Status: DC

## 2016-10-18 MED ORDER — BUPROPION HCL ER (SR) 150 MG PO TB12: 150.0000 mg | ORAL_TABLET | Freq: Every day | ORAL | 0 refills | Status: DC

## 2016-10-18 MED FILL — VIT D2 1.25 MG (50,000 UNIT: 1.25 MG | 28 days supply | Qty: 4 | Fill #0

## 2016-10-18 MED FILL — BUPROPION SR 150 MG TABLET: 150 | 30 days supply | Qty: 30 | Fill #0

## 2016-10-19 ENCOUNTER — Ambulatory Visit: Payer: 59 | Admitting: Physical Therapy

## 2016-10-19 ENCOUNTER — Encounter: Payer: Self-pay | Admitting: Physical Therapy

## 2016-10-19 ENCOUNTER — Encounter: Payer: Self-pay | Admitting: Physician Assistant

## 2016-10-19 DIAGNOSIS — R262 Difficulty in walking, not elsewhere classified: Secondary | ICD-10-CM

## 2016-10-19 DIAGNOSIS — G8929 Other chronic pain: Secondary | ICD-10-CM

## 2016-10-19 DIAGNOSIS — R252 Cramp and spasm: Secondary | ICD-10-CM | POA: Diagnosis not present

## 2016-10-19 DIAGNOSIS — M545 Low back pain: Principal | ICD-10-CM

## 2016-10-19 NOTE — Progress Notes (Signed)
Office: [phone]  /  Fax: [phone]   HPI:   Chief Complaint: OBESITY Sarah Duncan is here to discuss her progress with her obesity treatment plan. She is on the  follow the Category 2 plan and is following her eating plan approximately < 50 % of the time. She states she is exercising physical therapy 60 minutes 1 time per week. Sarah Duncan has been on vacation and found it harder to stay on track. She takes chronic steroids for back problems. Her weight is 225 lb (102.1 kg) today and has had a weight gain of 1 pound over a period of 3 weeks since her last visit. She has gained 1 lb since starting treatment with [place].  Vitamin D deficiency Sarah Duncan has a diagnosis of vitamin D deficiency. She is currently taking vit D and denies nausea, vomiting or muscle weakness.  Depression with emotional eating behaviors Sarah Duncan is struggling with emotional eating and using food for comfort to the extent that it is negatively impacting her health. She often snacks when she is not hungry. Sarah Duncan sometimes feels she is out of control and then feels guilty that she made poor food choices. She has been working on behavior modification techniques to help reduce her emotional eating and has been somewhat successful. She shows no sign of suicidal or homicidal ideations.  Pre-Diabetes Sarah Duncan has a diagnosis of pre-diabetes based on her elevated Hgb A1c at 6.0 and was informed this puts her at greater risk of developing diabetes. She is not taking metformin currently and continues to work on diet and exercise to decrease risk of diabetes. She admits polyphagia and denies nausea or hypoglycemia.  At risk for diabetes Sarah Duncan is at higher than average risk for developing diabetes due to her obesity and pre-diabetes. She currently denies polyuria or polydipsia.  Depression screen [facility] 2/9 49/15/2018 49/09/2016 49/27/2018 49/06/2016 49/21/2017  Decreased Interest 0 1 0 0 3  Down, Depressed, Hopeless 0 2 0 0 2  PHQ - 2 Score 0 3 0 0 5    Altered sleeping - 0 - - 3  Tired, decreased energy - 1 - - 3  Change in appetite - 1 - - 3  Feeling bad or failure about yourself  - 0 - - 3  Trouble concentrating - 0 - - 3  Moving slowly or fidgety/restless - 0 - - 2  Suicidal thoughts - 0 - - 0  PHQ-9 Score - 5 - - 22  Difficult doing work/chores - - - - Very difficult      ALLERGIES: No Known Allergies  MEDICATIONS: Current Outpatient Prescriptions on File Prior to Visit  Medication Sig Dispense Refill  . acetaminophen (TYLENOL) 650 MG CR tablet Take 650 mg by mouth every 8 (eight) hours as needed for pain.    Marland Kitchen BIOTIN PO Take by mouth daily.    . cyclobenzaprine (FLEXERIL) 10 MG tablet Take 0.5-1 tablets (5-10 mg total) by mouth 3 (three) times daily as needed for muscle spasms. 90 tablet 0  . escitalopram (LEXAPRO) 20 MG tablet Take 1 tablet (20 mg total) by mouth daily. 30 tablet 3  . MELATONIN PO Take by mouth daily.    . Multiple Vitamin (MULTIVITAMIN) tablet Take 1 tablet by mouth daily.    . pregabalin (LYRICA) 200 MG capsule Take 1 capsule (200 mg total) by mouth 3 (three) times daily. 90 capsule 0   No current facility-administered medications on file prior to visit.     PAST MEDICAL HISTORY: Past Medical  History:  Diagnosis Date  . Anemia   . Anxiety   . Arthritis   . Constipation   . Depression   . Fibroids   . 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 Lactose intolerance   . Palpitations   . Shortness of breath   . 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 ABDOMINAL HYSTERECTOMY  2007  . CESAREAN SECTION  11/19/1989  . 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 SPINE SURGERY  07/13/2015   Dr. Christia Reading  . TUBAL LIGATION  05/09/1993    SOCIAL HISTORY: Social History  Substance Use Topics  . Smoking status: Former Smoker    Years: 1.00    Types: Cigarettes  . Smokeless tobacco:  Never Used  . Alcohol use 49.6 oz/week    49 Standard drinks or equivalent per week     Comment: social    FAMILY HISTORY: Family History  Problem Relation Age of Onset  . Diabetes Mother   . Hypertension Mother   . Diabetes Brother   . Hypertension Brother   . Cancer Father   . Endometriosis Daughter   . Diabetes Daughter   . HIV Son     ROS: Review of Systems  Constitutional: Negative for weight loss.  Gastrointestinal: Negative for nausea and vomiting.  Genitourinary: Negative for frequency.  Musculoskeletal:       Negative muscle weakness  Endo/Heme/Allergies: Negative for polydipsia.       Polyphagia Negative hypoglycemia  Psychiatric/Behavioral: Positive for depression. Negative for suicidal ideas.    PHYSICAL EXAM: Blood pressure 109/75, height 5\' 8"  (1.727 m), weight 225 lb (102.1 kg). Body mass index is 34.21 kg/m. Physical Exam  Constitutional: She is oriented to person, place, and time. She appears well-developed and well-nourished.  Cardiovascular: Normal rate.   Pulmonary/Chest: Effort normal.  Musculoskeletal: Normal range of motion.  Neurological: She is oriented to person, place, and time.  Skin: Skin is warm and dry.  Psychiatric: She has a normal mood and affect. Her behavior is normal.  Vitals reviewed.   RECENT LABS AND TESTS: BMET    Component Value Date/Time   NA 141 049/15/2018 1025   K 4.4 049/15/2018 1025   CL 104 049/15/2018 1025   CO2 23 049/15/2018 1025   GLUCOSE 92 049/15/2018 1025   GLUCOSE 103 (H) 07/24/2016 1121   BUN 10 049/15/2018 1025   CREATININE 1.00 049/15/2018 1025   CREATININE 1.10 07/24/2016 1121   CALCIUM 9.5 049/15/2018 1025   GFRNONAA 67 049/15/2018 1025   GFRAA 77 049/15/2018 1025   Lab Results  Component Value Date   HGBA1C 6.0 (H) 08/24/2016   Lab Results  Component Value Date   INSULIN 31.7 (H) 08/24/2016   CBC    Component Value Date/Time   WBC 7.5 08/24/2016 1018   WBC 6.1 10/12/2015 1445   RBC 4.77  08/24/2016 1018   RBC 4.85 10/12/2015 1445   HGB 13.7 10/12/2015 1445   HCT 41.2 08/24/2016 1018   PLT 276 08/24/2016 1018   MCV 86 08/24/2016 1018   MCH 28.3 08/24/2016 1018   MCH 28.2 10/12/2015 1445   MCHC 32.8 08/24/2016 1018   MCHC 33.6 10/12/2015 1445   RDW 14.6 08/24/2016 1018   LYMPHSABS 3.5 (H) 08/24/2016 1018   MONOABS 610 10/12/2015 1445   EOSABS 0.2 08/24/2016 1018   BASOSABS 0.0 08/24/2016 1018  Iron/TIBC/Ferritin/ %Sat No results found for: IRON, TIBC, FERRITIN, IRONPCTSAT Lipid Panel     Component Value Date/Time   CHOL 185 08/24/2016 1018   TRIG 127 08/24/2016 1018   HDL 49 08/24/2016 1018   CHOLHDL 3.6 10/12/2015 1445   VLDL 19 10/12/2015 1445   LDLCALC 111 (H) 08/24/2016 1018   Hepatic Function Panel     Component Value Date/Time   PROT 7.6 08/24/2016 1018   ALBUMIN 4.1 08/24/2016 1018   AST 15 08/24/2016 1018   ALT 10 08/24/2016 1018   ALKPHOS 116 08/24/2016 1018   BILITOT <0.2 08/24/2016 1018      Component Value Date/Time   TSH 1.940 08/24/2016 1018   TSH 3.92 07/24/2016 1121   TSH 2.79 10/12/2015 1445    ASSESSMENT AND PLAN: Vitamin D deficiency - Plan: Vitamin D, Ergocalciferol, (DRISDOL) 50000 units CAPS capsule  Other depression - Plan: buPROPion (WELLBUTRIN SR) 150 MG 12 hr tablet  Prediabetes  At risk for diabetes mellitus  Class 1 obesity without serious comorbidity with body mass index (BMI) of 34.0 to 34.9 in adult, unspecified obesity type  PLAN:  Vitamin D Deficiency Anniya was informed that low vitamin D levels contributes to fatigue and are associated with obesity, breast, and colon cancer. She agrees to continue to take prescription Vit D @50 ,000 IU every week, we will refill for 1 month and will follow up for routine testing of vitamin D, at least 2-3 times per year. She was informed of the risk of over-replacement of vitamin D and agrees to not increase her dose unless he discusses this with [place] first. Desta agrees to  follow up with our clinic in 2 weeks.  Depression with Emotional Eating Behaviors We discussed behavior modification techniques today to help Naseem deal with her emotional eating and depression. She has agreed to continue to take Wellbutrin SR 150 mg qd we will refill for 1 month and will follow up as directed.  Pre-Diabetes Alyzabeth will continue to work on weight loss, exercise, and decreasing simple carbohydrates in her diet to help decrease the risk of diabetes. We dicussed metformin including benefits and risks. She was informed that eating too many simple carbohydrates or too many calories at one sitting increases the likelihood of GI side effects. Shaylan declined metformin for now and a prescription was not written today. Idelle agreed to follow up with [place] as directed to monitor her progress.  Diabetes risk counselling Halea was given extended (at least 15 minutes) diabetes prevention counseling today. She is 51 y.o. female and has risk factors for diabetes including obesity and pre-diabetes. We discussed intensive lifestyle modifications today with an emphasis on weight loss as well as increasing exercise and decreasing simple carbohydrates in her diet.  Obesity Verneda is currently in the action stage of change. As such, her goal is to continue with weight loss efforts She has agreed to follow the Category 2 plan Ambrielle has been instructed to work up to a goal of 150 minutes of combined cardio and strengthening exercise per week for weight loss and overall health benefits. We discussed the following Behavioral Modification Strategies today: increasing lean protein intake and decreasing simple carbohydrates   Athalia has agreed to follow up with our clinic in 2 weeks. She was informed of the importance of frequent follow up visits to maximize her success with intensive lifestyle modifications for her multiple health conditions.  Corey Skains, am acting as scribe for Dennard Nip, MD  I have  reviewed  the above documentation for accuracy and completeness, and I agree with the above. -Dennard Nip, MD  OBESITY BEHAVIORAL INTERVENTION VISIT  Today's visit was # 4 out of 22.  Starting weight: 224 lbs Starting date: 08/24/16 Today's weight : 225 lbs Today's date: 10/19/2016 Total lbs lost to date: 0 (Patients must lose 7 lbs in the first 6 months to continue with counseling)   ASK: We discussed the diagnosis of obesity with Rebecca Eaton today and Bridey agreed to give [place] permission to discuss obesity behavioral modification therapy today.  ASSESS: Lamaria has the diagnosis of obesity and her BMI today is 34.3 Bessie is in the action stage of change   ADVISE: Blaze was educated on the multiple health risks of obesity as well as the benefit of weight loss to improve her health. She was advised of the need for long term treatment and the importance of lifestyle modifications.  AGREE: Multiple dietary modification options and treatment options were discussed and  Felicidad agreed to follow the Category 2 plan We discussed the following Behavioral Modification Strategies today: increasing lean protein intake and decreasing simple carbohydrates

## 2016-10-19 NOTE — Therapy (Signed)
[place] [place], [place], [address] Phone: [phone]   Fax:  [phone]  Physical Therapy Treatment  Patient Details  Name: Sarah Duncan MRN: [id number] Date of Birth: 11/23/1965 Referring Provider: Olga Millers  Encounter Date: 10/19/2016      PT End of Session - 10/19/16 1132    Visit Number 6   Number of Visits 24   Date for PT Re-Evaluation 11/04/16   PT Start Time 1057   PT Stop Time 1146   PT Time Calculation (min) 49 min   Activity Tolerance Patient limited by pain;Patient tolerated treatment well   Behavior During Therapy [facility] for tasks assessed/performed      Past Medical History:  Diagnosis Date  . Anemia   . Anxiety   . Arthritis   . Constipation   . Depression   . Fibroids   . 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 Lactose intolerance   . Palpitations   . Shortness of breath   . 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 ABDOMINAL HYSTERECTOMY  2007  . CESAREAN SECTION  11/19/1989  . 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 SPINE SURGERY  07/13/2015   Dr. Christia Reading  . TUBAL LIGATION  05/09/1993    There were no vitals filed for this visit.      Subjective Assessment - 10/19/16 1100    Subjective Patient reports that she is in pain a 7/10 in the low back.  Does not want to try the traction   Currently in Pain? Yes   Pain Score 7    Pain Location Back   Aggravating Factors  any bending, standing, walking   Pain Relieving Factors reports that the only thing that helps is the electrical stimulation                         [facility] Adult PT Treatment/Exercise - 10/19/16 0001      Therapeutic Activites    Therapeutic Activities ADL's;Lifting;Other Therapeutic Activities   ADL's laundry, making beds   Lifting from floor and from waist, avoid  twisting, back straight, golfer's lift   Other Therapeutic Activities vacuuming, foot prop for prolonged standing     Lumbar Exercises: Aerobic   UBE (Upper Arm Bike) L3 3 frd/ 3 rev     Lumbar Exercises: Supine   Other Supine Lumbar Exercises feet on ball K2C, trunk rotation, small bridges and abdominal isometrics     Moist Heat Therapy   Number Minutes Moist Heat 15 Minutes   Moist Heat Location Lumbar Spine     Electrical Stimulation   Electrical Stimulation Location Lumbar area   Electrical Stimulation Action IFC   Electrical Stimulation Parameters sitting   Electrical Stimulation Goals Pain                  PT Short Term Goals - 09/26/16 0936      PT SHORT TERM GOAL #1   Title independent with initial HEP   Time 2   Period Weeks   Status On-going           PT Long Term Goals - 10/19/16 1155      PT LONG TERM GOAL #1   Title understand posture and body mechanics   Status Achieved  Plan - 10/19/16 1152    Clinical Impression Statement Patient remains in pain and reports nothing really helps.  I went over a lot of posture and body mechanics with her today on how to decrease stress on the back, she verbalized understanding.  I also went over her diagnosis and how flexibility and core strength could decrease the stress on her back.  She may try to see the MD in the next 1-2 weeks   PT Next Visit Plan continue Work on lumbar stretching and strengthening, ask pt to follow up with her MD   Consulted and Agree with Plan of Care Patient      Patient will benefit from skilled therapeutic intervention in order to improve the following deficits and impairments:  Decreased mobility, Decreased range of motion, Difficulty walking, Decreased strength, Impaired flexibility, Increased muscle spasms, Pain, Improper body mechanics  Visit Diagnosis: Chronic bilateral low back pain without sciatica  Difficulty in walking, not elsewhere  classified  Cramp and spasm     Problem List Patient Active Problem List   Diagnosis Date Noted  . Class 1 obesity without serious comorbidity with body mass index (BMI) of 34.0 to 34.9 in adult 10/18/2016  . Prediabetes 10/18/2016  . Vitamin D deficiency 10/18/2016  . BMI 34.0-34.9,adult 11/25/2015  . Depression 11/23/2015  . 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 Chronic lower back pain 06/18/2015    Sumner Boast., PT 10/19/2016, 11:58 AM  [address] [place], [place], [address] Phone: [phone]   Fax:  [phone]  Name: Sarah Duncan MRN: [id number] Date of Birth: 10/22/1965

## 2016-10-25 ENCOUNTER — Encounter: Payer: Self-pay | Admitting: Physical Therapy

## 2016-10-25 ENCOUNTER — Ambulatory Visit: Payer: 59 | Attending: Orthopaedic Surgery | Admitting: Physical Therapy

## 2016-10-25 DIAGNOSIS — M545 Low back pain, unspecified: Secondary | ICD-10-CM

## 2016-10-25 DIAGNOSIS — R252 Cramp and spasm: Secondary | ICD-10-CM | POA: Diagnosis not present

## 2016-10-25 DIAGNOSIS — G8929 Other chronic pain: Secondary | ICD-10-CM | POA: Diagnosis not present

## 2016-10-25 DIAGNOSIS — R262 Difficulty in walking, not elsewhere classified: Secondary | ICD-10-CM | POA: Diagnosis not present

## 2016-10-25 NOTE — Therapy (Addendum)
[place] [address] [place], [place], [address] Phone: [phone]   Fax:  [phone]  Physical Therapy Treatment  Patient Details  Name: Sarah Duncan MRN: [id number] Date of Birth: [birth year] Referring Provider: Olga Millers  Encounter Date: 10/25/2016      PT End of Session - 10/25/16 1216    Visit Number 7   Number of Visits 24   Date for PT Re-Evaluation 11/04/16   PT Start Time 1145   PT Stop Time 1230   PT Time Calculation (min) 45 min   Activity Tolerance Patient tolerated treatment well   Behavior During Therapy [facility] for tasks assessed/performed      Past Medical History:  Diagnosis Date  . Anemia   . Anxiety   . Arthritis   . Constipation   . Depression   . Fibroids   . 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 Lactose intolerance   . Palpitations   . Shortness of breath   . 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 ABDOMINAL HYSTERECTOMY  2007  . CESAREAN SECTION  11/19/1989  . 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 SPINE SURGERY  07/13/2015   Dr. Christia Reading  . TUBAL LIGATION  05/09/1993    There were no vitals filed for this visit.      Subjective Assessment - 10/25/16 1150    Subjective "So So" "I have an appointment July 3rd"   Currently in Pain? Yes   Pain Score 6                          [facility] Adult PT Treatment/Exercise - 10/25/16 0001      Lumbar Exercises: Machines for Strengthening   Other Lumbar Machine Exercise Lat pull down 2x10 25 lbs, rows 2x10 25lb     Moist Heat Therapy   Number Minutes Moist Heat 15 Minutes   Moist Heat Location Lumbar Spine     Electrical Stimulation   Electrical Stimulation Location Lumbar area   Electrical Stimulation Action IFC   Electrical Stimulation Parameters sitting   Electrical Stimulation Goals Pain     Traction   Type of Traction Lumbar   Max (lbs) 70   Time 15                  PT Short Term Goals - 09/26/16 0936      PT SHORT TERM GOAL #1   Title independent with initial HEP   Time 2   Period Weeks   Status On-going           PT Long Term Goals - 10/25/16 1218      PT LONG TERM GOAL #1   Title understand posture and body mechanics   Status Achieved     PT LONG TERM GOAL #2   Title decrease pain 50%   Status On-going     PT LONG TERM GOAL #3   Title increase lumbar ROM 50%   Status On-going     PT LONG TERM GOAL #4   Title tolerate grocery shopping without increase of pain   Status On-going     PT LONG TERM GOAL #5   Title increase LE strength to 4/5   Status On-going  Plan - 10/25/16 1217    Clinical Impression Statement No issues with today's exercises. Attempted traction again coupled with e-Stim IFC to low back as well as MHP to make it more tolerable.    Rehab Potential Fair   PT Frequency 2x / week   PT Duration 8 weeks   PT Treatment/Interventions ADLs/Self Care Home Management;Cryotherapy;Electrical Stimulation;Moist Heat;Therapeutic exercise;Therapeutic activities;Functional mobility training;Ultrasound;Traction;Neuromuscular re-education;Patient/family education;Manual techniques   PT Next Visit Plan assess traction with modalities      Patient will benefit from skilled therapeutic intervention in order to improve the following deficits and impairments:  Decreased mobility, Decreased range of motion, Difficulty walking, Decreased strength, Impaired flexibility, Increased muscle spasms, Pain, Improper body mechanics  Visit Diagnosis: Chronic bilateral low back pain without sciatica  Difficulty in walking, not elsewhere classified  Cramp and spasm     Problem List Patient Active Problem List   Diagnosis Date Noted  . Class 1 obesity without serious comorbidity with body mass index (BMI) of 34.0 to 34.9 in  adult 10/18/2016  . Prediabetes 10/18/2016  . Vitamin D deficiency 10/18/2016  . BMI 34.0-34.9,adult 11/25/2015  . Depression 11/23/2015  . 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 Chronic lower back pain 06/18/2015   PHYSICAL THERAPY DISCHARGE SUMMARY  Visits from Start of Care: 7 Plan: Patient agrees to discharge.  Patient goals were not met. Patient is being discharged due to not returning since the last visit.  ?????     Scot Jun, PTA 10/25/2016, 12:20 PM  [address] [place], [place], [address] Phone: [phone]   Fax:  [phone]  Name: Sarah Duncan MRN: [id number] Date of Birth: 1965/07/12

## 2016-11-01 ENCOUNTER — Ambulatory Visit (INDEPENDENT_AMBULATORY_CARE_PROVIDER_SITE_OTHER): Payer: 59 | Admitting: Orthopaedic Surgery

## 2016-11-07 ENCOUNTER — Ambulatory Visit (INDEPENDENT_AMBULATORY_CARE_PROVIDER_SITE_OTHER): Payer: 59 | Admitting: Family Medicine

## 2016-11-07 VITALS — BP 128/80 | HR 77 | Temp 98.6°F | Ht 68.0 in | Wt 228.0 lb

## 2016-11-07 DIAGNOSIS — F3289 Other specified depressive episodes: Secondary | ICD-10-CM

## 2016-11-07 DIAGNOSIS — E559 Vitamin D deficiency, unspecified: Secondary | ICD-10-CM | POA: Diagnosis not present

## 2016-11-07 DIAGNOSIS — E669 Obesity, unspecified: Secondary | ICD-10-CM | POA: Diagnosis not present

## 2016-11-07 DIAGNOSIS — Z6834 Body mass index (BMI) 34.0-34.9, adult: Secondary | ICD-10-CM | POA: Diagnosis not present

## 2016-11-07 MED ORDER — VITAMIN D (ERGOCALCIFEROL) 1.25 MG (50000 UNIT) PO CAPS: 50000.0000 [IU] | ORAL_CAPSULE | ORAL | 0 refills | Status: DC

## 2016-11-07 MED ORDER — BUPROPION HCL ER (SR) 200 MG PO TB12: 200.0000 mg | ORAL_TABLET | Freq: Every day | ORAL | 0 refills | Status: DC

## 2016-11-07 MED FILL — BUPROPION HCL SR 200 MG TAB: 200 | 30 days supply | Qty: 30 | Fill #0

## 2016-11-08 NOTE — Progress Notes (Signed)
Office: [phone]  /  Fax: [phone]   HPI:   Chief Complaint: OBESITY Sarah Duncan is here to discuss her progress with her obesity treatment plan. She is on the  follow the Category 2 plan and is following her eating plan approximately 35 % of the time. She states she is exercising physical therapy. [name] is not following plan closely. She has not done very well in our program thus far and may not be ready to make the required changes needed. Her weight is 228 lb (103.4 kg) today and has had a weight gain of 3 pounds over a period of 3 weeks since her last visit. She has lost 0 lbs since starting treatment with [place].  Vitamin D deficiency Sarah Duncan has a diagnosis of vitamin D deficiency. She is currently stable on vit D and denies nausea, vomiting or muscle weakness.  Depression with emotional eating behaviors Sarah Duncan is still struggling with emotional eating and is unable to concentrate on improving her health. Sarah Duncan struggles with emotional eating and using food for comfort to the extent that it is negatively impacting her health. She often snacks when she is not hungry. Sarah Duncan sometimes feels she is out of control and then feels guilty that she made poor food choices. She has been working on behavior modification techniques to help reduce her emotional eating and has been somewhat successful. She shows no sign of suicidal or homicidal ideations.  Depression screen [facility] 2/9 10/03/2016 08/24/2016 07/18/2016 05/23/2016 04/11/2016  Decreased Interest 0 1 0 0 3  Down, Depressed, Hopeless 0 2 0 0 2  PHQ - 2 Score 0 3 0 0 5  Altered sleeping - 0 - - 3  Tired, decreased energy - 1 - - 3  Change in appetite - 1 - - 3  Feeling bad or failure about yourself  - 0 - - 3  Trouble concentrating - 0 - - 3  Moving slowly or fidgety/restless - 0 - - 2  Suicidal thoughts - 0 - - 0  PHQ-9 Score - 5 - - 22  Difficult doing work/chores - - - - Very difficult     ALLERGIES: No Known  Allergies  MEDICATIONS: Current Outpatient Prescriptions on File Prior to Visit  Medication Sig Dispense Refill   acetaminophen (TYLENOL) 650 MG CR tablet Take 650 mg by mouth every 8 (eight) hours as needed for pain.     BIOTIN PO Take by mouth daily.     cyclobenzaprine (FLEXERIL) 10 MG tablet Take 0.5-1 tablets (5-10 mg total) by mouth 3 (three) times daily as needed for muscle spasms. 90 tablet 0   escitalopram (LEXAPRO) 20 MG tablet Take 1 tablet (20 mg total) by mouth daily. 30 tablet 3   MELATONIN PO Take by mouth daily.     Multiple Vitamin (MULTIVITAMIN) tablet Take 1 tablet by mouth daily.     pregabalin (LYRICA) 200 MG capsule Take 1 capsule (200 mg total) by mouth 3 (three) times daily. 90 capsule 0   No current facility-administered medications on file prior to visit.     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 Christia Reading   TUBAL LIGATION  05/09/1993    SOCIAL HISTORY: Social History  Substance Use Topics   Smoking status: Former Smoker    Years: 1.00    Types: Cigarettes   Smokeless tobacco: Never Used   Alcohol use 0.6 oz/week    1 Standard drinks or equivalent per week     Comment: social    FAMILY HISTORY: Family History  Problem Relation Age of Onset   Diabetes Mother    Hypertension Mother    Diabetes Brother    Hypertension Brother    Cancer Father    Endometriosis Daughter    Diabetes Daughter    HIV Son      ROS: Review of Systems  Constitutional: Negative for weight loss.  Gastrointestinal: Negative for nausea and vomiting.  Musculoskeletal:       Negative muscle weakness  Psychiatric/Behavioral: Positive for depression. Negative for suicidal ideas.    PHYSICAL EXAM: Blood pressure 128/80, pulse 77, temperature 98.6 F (37 C), temperature source Oral, height 5\' 8"  (1.727 m), weight 228 lb (103.4 kg), SpO2 100 %. Body mass index is 34.67 kg/m. Physical Exam  Constitutional: She is oriented to person, place, and time. She appears well-developed and well-nourished.  Cardiovascular: Normal rate.   Pulmonary/Chest: Effort normal.  Musculoskeletal: Normal range of motion.  Neurological: She is oriented to person, place, and time.  Skin: Skin is warm and dry.  Vitals reviewed.   RECENT LABS AND TESTS: BMET    Component Value Date/Time   NA 141 10/03/2016 1025   K 4.4 10/03/2016 1025   CL 104 10/03/2016 1025   CO2 23 10/03/2016 1025   GLUCOSE 92 10/03/2016 1025   GLUCOSE 103 (H) 07/24/2016 1121   BUN 10 10/03/2016 1025   CREATININE 1.00 10/03/2016 1025   CREATININE 1.10 07/24/2016 1121   CALCIUM 9.5 10/03/2016 1025   GFRNONAA 67 10/03/2016 1025   GFRAA 77 10/03/2016 1025   Lab Results  Component Value Date   HGBA1C 6.0 (H) 08/24/2016   Lab Results  Component Value Date   INSULIN 31.7 (H) 08/24/2016   CBC    Component Value Date/Time   WBC 7.5 08/24/2016 1018   WBC 6.1 10/12/2015 1445   RBC 4.77 08/24/2016 1018   RBC 4.85 10/12/2015 1445   HGB 13.5 08/24/2016 1018   HCT 41.2 08/24/2016 1018   PLT 276 08/24/2016 1018   MCV 86 08/24/2016 1018   MCH 28.3 08/24/2016 1018   MCH 28.2 10/12/2015 1445   MCHC 32.8 08/24/2016 1018   MCHC 33.6 10/12/2015 1445   RDW 14.6 08/24/2016 1018   LYMPHSABS 3.5 (H) 08/24/2016 1018   MONOABS 610 10/12/2015 1445   EOSABS 0.2 08/24/2016 1018   BASOSABS 0.0 08/24/2016 1018   Iron/TIBC/Ferritin/ %Sat No results found for: IRON,  TIBC, FERRITIN, IRONPCTSAT Lipid Panel     Component Value Date/Time   CHOL 185 08/24/2016 1018   TRIG 127 08/24/2016 1018   HDL 49 08/24/2016 1018   CHOLHDL 3.6 10/12/2015 1445   VLDL 19 10/12/2015 1445   LDLCALC 111 (H) 08/24/2016 1018   Hepatic Function Panel     Component Value Date/Time   PROT 7.6 08/24/2016 1018   ALBUMIN 4.1 08/24/2016 1018   AST 15  08/24/2016 1018   ALT 10 08/24/2016 1018   ALKPHOS 116 08/24/2016 1018   BILITOT <0.2 08/24/2016 1018      Component Value Date/Time   TSH 1.940 08/24/2016 1018   TSH 3.92 07/24/2016 1121   TSH 2.79 10/12/2015 1445    ASSESSMENT AND PLAN: Other depression - Plan: buPROPion (WELLBUTRIN SR) 200 MG 12 hr tablet  Vitamin D deficiency - Plan: Vitamin D, Ergocalciferol, (DRISDOL) 50000 units CAPS capsule  Class 1 obesity without serious comorbidity with body mass index (BMI) of 34.0 to 34.9 in adult, unspecified obesity type  PLAN:  Vitamin D Deficiency Stayce was informed that low vitamin D levels contributes to fatigue and are associated with obesity, breast, and colon cancer. She agrees to continue to take prescription Vit D @50 ,000 IU every week, we will refill for 1 month and will follow up for routine testing of vitamin D, at least 2-3 times per year. She was informed of the risk of over-replacement of vitamin D and agrees to not increase her dose unless he discusses this with [place] first. Denelle agrees to follow up with our clinic in 3 to 4 weeks.  Depression with Emotional Eating Behaviors We discussed behavior modification techniques today to help Shay deal with her emotional eating and depression. She has agreed to increase Wellbutrin SR to 200 mg every morning #30 with no refills and will follow up in 3 to 4 weeks to re-assess.  Obesity Teonia is currently in the action stage of change. As such, her goal is to continue with weight loss efforts She has agreed to follow the Category 2 plan Myracle has been instructed to  work up to a goal of 150 minutes of combined cardio and strengthening exercise per week for weight loss and overall health benefits. We discussed the following Behavioral Modification Strategies today: travel eating strategies  Goal is to maintain weight while travelling and will re-assess her readiness to change at our next visit.  Rashonda has agreed to follow up with our clinic in 3 to 4 weeks. She was informed of the importance of frequent follow up visits to maximize her success with intensive lifestyle modifications for her multiple health conditions.  I, Doreene Nest, am acting as transcriptionist for Dennard Nip, MD  I have reviewed the above documentation for accuracy and completeness, and I agree with the above. -Dennard Nip, MD  OBESITY BEHAVIORAL INTERVENTION VISIT  Today's visit was # 5 out of 22.  Starting weight: 224 lbs Starting date: 08/24/16 Today's weight : 228 lbs Today's date: 11/07/2016 Total lbs lost to date: 0 (Patients must lose 7 lbs in the first 6 months to continue with counseling)   ASK: We discussed the diagnosis of obesity with Rebecca Eaton today and Freja agreed to give [place] permission to discuss obesity behavioral modification therapy today.  ASSESS: Braleigh has the diagnosis of obesity and her BMI today is 34.7 Karlyn is in the action stage of change   ADVISE: Tyannah was educated on the multiple health risks of obesity as well as the benefit of weight loss to improve her health. She was advised of the need for long term treatment and the importance of lifestyle modifications.  AGREE: Multiple dietary modification options and treatment options were discussed and  Yarima agreed to follow the Category 2 plan We discussed the following Behavioral Modification Strategies today: travel eating strategies

## 2016-11-09 MED FILL — VIT D2 1.25 MG (50,000 UNIT: 1.25 MG | 28 days supply | Qty: 4 | Fill #0

## 2016-11-15 ENCOUNTER — Other Ambulatory Visit: Payer: Self-pay | Admitting: Physician Assistant

## 2016-11-15 DIAGNOSIS — F32A Depression, unspecified: Secondary | ICD-10-CM

## 2016-11-15 DIAGNOSIS — F329 Major depressive disorder, single episode, unspecified: Secondary | ICD-10-CM

## 2016-11-15 MED FILL — ESCITALOPRAM 20 MG TABLET: 20 | 30 days supply | Qty: 30 | Fill #0

## 2016-11-21 ENCOUNTER — Ambulatory Visit (INDEPENDENT_AMBULATORY_CARE_PROVIDER_SITE_OTHER): Payer: 59 | Admitting: Orthopaedic Surgery

## 2016-11-21 VITALS — BP 134/93 | HR 98 | Ht 67.0 in | Wt 228.0 lb

## 2016-11-21 DIAGNOSIS — G8929 Other chronic pain: Secondary | ICD-10-CM | POA: Diagnosis not present

## 2016-11-21 DIAGNOSIS — M545 Low back pain: Secondary | ICD-10-CM | POA: Diagnosis not present

## 2016-11-21 NOTE — Progress Notes (Signed)
Office Visit Note   [name]           Date of Birth: 03/15/1966           MRN: [id number] Visit Date: 11/21/2016              Requested by: Harrison Mons, PA-C [address] [place], [place] [address] PCP: Harrison Mons, PA-C   Assessment & Plan: Visit Diagnoses:  1. Chronic low back pain, unspecified back pain laterality, with sciatica presence unspecified     Plan: Review the previous MRI which shows some disc degeneration at the L4-5 level with endplate changes. She does not have any nerve root compression. She needs to lose weight, participated excised activity. She can talk with her primary care physician about possibly stopping the Lyrica which might help her some with weight loss since she really hasn't noticed any benefit taking the Lyrica. She needs continued taking her medication for depression and follow a wellness program to gradually lose some weight to unload her back and help with her symptoms. We discussed exercises she should avoid and excised that she should be able to participate including pool exercises, stationary bicycle, elliptical, core strengthening and light weight activity avoiding exercises that would load her lumbar spine. She can return in 4 months.  Follow-Up Instructions: Return in about 4 months (around 03/24/2017).   Orders:  No orders of the defined types were placed in this encounter.  No orders of the defined types were placed in this encounter.     Procedures: No procedures performed   Clinical Data: No additional findings.   Subjective: Chief Complaint  Patient presents with  . Lower Back - Follow-up    HPI patient returns with ongoing chronic back pain. She had previous surgery on the right at L4-5 in [place] in February 2017. She's been going to wellness clinic and has gradually been increasing weight. She's not doing any exercise activity. BMI is 35.7. When she walks for a while she states she has back  pain. She is on Lyrica and doesn't really feel that it's specifically helped her pain. Patient denies bowel or bladder symptoms. She been through physical therapy for 2 months. Patient is applied for associated disability.  Review of Systems 14 point review of systems updated from 08/23/2016 office visit and is unchanged other than as mentioned in history of present illness. She has chronic problems with depression, obesity and chronic back pain. Patient only has one kidney cannot take anti-inflammatories.   Objective: Vital Signs: BP (!) 134/93   Pulse 98   Ht 5\' 7"  (1.702 m)   Wt 228 lb (103.4 kg)   BMI 35.71 kg/m   Physical Exam  Constitutional: She is oriented to person, place, and time. She appears well-developed.  HENT:  Head: Normocephalic.  Right Ear: External ear normal.  Left Ear: External ear normal.  Eyes: Pupils are equal, round, and reactive to light.  Neck: No tracheal deviation present. No thyromegaly present.  Cardiovascular: Normal rate.   Pulmonary/Chest: Effort normal.  Abdominal: Soft.  Musculoskeletal:  Normal heel toe gait. No rash or exposed skin. Good range of motion of her hips knees reach full extension. She has good flexion of both knees.  Neurological: She is alert and oriented to person, place, and time.  Skin: Skin is warm and dry.  Psychiatric: She has a normal mood and affect. Her behavior is normal.    Ortho Exam  Specialty Comments:  No specialty comments available.  Imaging: No results found.   PMFS History: Patient Active Problem List   Diagnosis Date Noted  . Class 1 obesity without serious comorbidity with body mass index (BMI) of 34.0 to 34.9 in adult 10/18/2016  . Prediabetes 10/18/2016  . Vitamin D deficiency 10/18/2016  . BMI 34.0-34.9,adult 11/25/2015  . Depression 11/23/2015  . 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 Chronic lower back pain 06/18/2015   Past Medical History:  Diagnosis Date  . Anemia   . Anxiety   . Arthritis   .  Constipation   . Depression   . Fibroids   . 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 Lactose intolerance   . Palpitations   . Shortness of breath   . 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 Diabetes Mother   . Hypertension Mother   . Diabetes Brother   . Hypertension Brother   . Cancer Father   . Endometriosis Daughter   . Diabetes Daughter   . HIV Son     Past Surgical History:  Procedure Laterality Date  . ABDOMINAL HYSTERECTOMY  2007  . CESAREAN SECTION  11/19/1989  . 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 SPINE SURGERY  07/13/2015   Dr. Christia Reading  . TUBAL LIGATION  05/09/1993   Social History   Occupational History  . 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 Smoking status: Former Smoker    Years: 1.00    Types: Cigarettes  . Smokeless tobacco: Never Used  . Alcohol use 0.6 oz/week    1 Standard drinks or equivalent per week     Comment: social  . Drug use: No  . Sexual activity: Yes    Partners: Female

## 2016-11-24 ENCOUNTER — Encounter: Payer: Self-pay | Admitting: Physician Assistant

## 2016-11-24 ENCOUNTER — Ambulatory Visit (INDEPENDENT_AMBULATORY_CARE_PROVIDER_SITE_OTHER): Payer: 59 | Admitting: Physician Assistant

## 2016-11-24 VITALS — BP 120/81 | HR 84 | Temp 97.5°F | Resp 16 | Ht 67.0 in | Wt 229.2 lb

## 2016-11-24 DIAGNOSIS — F3289 Other specified depressive episodes: Secondary | ICD-10-CM | POA: Diagnosis not present

## 2016-11-24 DIAGNOSIS — G8929 Other chronic pain: Secondary | ICD-10-CM | POA: Diagnosis not present

## 2016-11-24 DIAGNOSIS — M545 Low back pain: Secondary | ICD-10-CM

## 2016-11-24 DIAGNOSIS — Z6834 Body mass index (BMI) 34.0-34.9, adult: Secondary | ICD-10-CM | POA: Diagnosis not present

## 2016-11-24 NOTE — Progress Notes (Signed)
Patient ID: Sarah Duncan, female    DOB: [birth year], 51 y.o.   MRN: [id number]  PCP: Harrison Mons, PA-C  Chief Complaint  Patient presents with  . orthopedic    here to discuss orthopedic results  . Follow-up    Subjective:   Presents to discuss recent recommendations from Dr. Lorin Mercy. She is accompanied by her wife, Denyse Dago.  She saw Dr. Lorin Mercy 11/21/2016 for follow-up on her chronic back pain. She has been going to PT and medical weight management, as advised, without any improvement. She has not lost weight. No additional imaging was recommended. She was advised to work on losing weight, and to consider stopping Lyrica, as it hasn't afforded significant benefit, and may be contributing to weight gain.  At her visit with Dr. Leafy Ro last month, bupropion XL 150 mg QD was changed to bupropion SR 200 mg QD. This was intended to help reduce emotional eating.  She notes improvement in her depression overall since starting escitalopram and bupropion, but continues to feel depressed, have low energy, feelings of inadequacy and guilt that she isn't able to work or engage in activities with her wife because of her pain. She remains very frustrated with her condition and perceived lack of anything helping.  Dr. Lorin Mercy' and Dr. Migdalia Dk notes are reviewed, along with PT notes. She has struggled with the eating plan, following it only 35% of the time. She has been discharged from PT following 7 visits, having not returned for follow-up. Her goals were not met.      Review of Systems  Constitutional: Negative.   HENT: Negative for sore throat.   Eyes: Negative for visual disturbance.  Respiratory: Negative for cough, chest tightness, shortness of breath and wheezing.   Cardiovascular: Negative for chest pain and palpitations.  Gastrointestinal: Negative for abdominal pain, diarrhea, nausea and vomiting.  Genitourinary: Negative for dysuria, frequency, hematuria and  urgency.  Musculoskeletal: Positive for back pain and gait problem. Negative for arthralgias and myalgias.  Skin: Negative for rash.  Neurological: Negative for dizziness, weakness and headaches.  Psychiatric/Behavioral: Positive for dysphoric mood. Negative for decreased concentration, self-injury and suicidal ideas. The patient is not nervous/anxious.        Patient Active Problem List   Diagnosis Date Noted  . Class 1 obesity without serious comorbidity with body mass index (BMI) of 34.0 to 34.9 in adult 10/18/2016  . Prediabetes 10/18/2016  . Vitamin D deficiency 10/18/2016  . BMI 34.0-34.9,adult 11/25/2015  . Depression 11/23/2015  . 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 Chronic lower back pain 06/18/2015     Prior to Admission medications   Medication Sig Start Date End Date Taking? Authorizing Provider  BIOTIN PO Take by mouth daily.   Yes [provider]  buPROPion (WELLBUTRIN SR) 200 MG 12 hr tablet Take 1 tablet (200 mg total) by mouth daily. 11/07/16  Yes Beasley, Caren D, MD  cyclobenzaprine (FLEXERIL) 10 MG tablet Take 0.5-1 tablets (5-10 mg total) by mouth 3 (three) times daily as needed for muscle spasms. 10/03/16  Yes Favio Moder, PA-C  escitalopram (LEXAPRO) 20 MG tablet TAKE 1 TABLET BY MOUTH ONCE DAILY 11/15/16  Yes Finnlee Silvernail, PA-C  MELATONIN PO Take by mouth daily.   Yes [provider]  Multiple Vitamin (MULTIVITAMIN) tablet Take 1 tablet by mouth daily.   Yes [provider]  pregabalin (LYRICA) 200 MG capsule Take 1 capsule (200 mg total) by mouth 3 (three) times daily. 10/03/16  Yes Harrison Mons,  PA-C  Vitamin D, Ergocalciferol, (DRISDOL) 50000 units CAPS capsule Take 1 capsule (50,000 Units total) by mouth every 7 (seven) days. 11/07/16  Yes Dennard Nip D, MD     No Known Allergies     Objective:  Physical Exam  Constitutional: She is oriented to person, place, and time. She appears well-developed and well-nourished. She is  active and cooperative. No distress.  BP 120/81   Pulse 84   Temp (!) 97.5 F (36.4 C) (Oral)   Resp 16   Ht _0  (1.702 m)   Wt 229 lb 3.2 oz (104 kg)   SpO2 100%   BMI 35.90 kg/m   HENT:  Head: Normocephalic and atraumatic.  Right Ear: Hearing normal.  Left Ear: Hearing normal.  Eyes: Conjunctivae are normal. No scleral icterus.  Neck: Normal range of motion. Neck supple. No thyromegaly present.  Cardiovascular: Normal rate, regular rhythm and normal heart sounds.   Pulses:      Radial pulses are 2+ on the right side, and 2+ on the left side.  Pulmonary/Chest: Effort normal and breath sounds normal.  Lymphadenopathy:       Head (right side): No tonsillar, no preauricular, no posterior auricular and no occipital adenopathy present.       Head (left side): No tonsillar, no preauricular, no posterior auricular and no occipital adenopathy present.    She has no cervical adenopathy.       Right: No supraclavicular adenopathy present.       Left: No supraclavicular adenopathy present.  Neurological: She is alert and oriented to person, place, and time. No sensory deficit.  Skin: Skin is warm, dry and intact. No rash noted. No cyanosis or erythema. Nails show no clubbing.  Psychiatric: She has a normal mood and affect. Her speech is normal and behavior is normal.           Assessment & Plan:   Problem List Items Addressed This Visit    Chronic lower back pain - Primary    Discussed potentially stopping pregabalin and trying duloxetine. Will reach out to Dr. Leafy Ro regarding the bupropion, as duloxetine would require D/C bupropion, in addition to escitalopram.      Depression    Improved with escitalopram and bupropion, but not resolved. Back pain continues to be a major contributor and obstacle to improvement. Consider cross-tapering of escitalopram and bupropion with duloxetine, which may address both depression and her chronic pain. Will reach out to Dr. Leafy Ro before  making changes. If bupropion should be continued, to address the emotional eating, would wonder if SR BID dosing or increasing XL formulation to 300 mg QD would help better address the depression symptoms.      BMI 34.0-34.9,adult    Await guidance from Dr. Leafy Ro regarding change in bupropion (either increase dose or change to duloxetine). Stressed the benefit of weight loss and core strengthening in reducing her back pain. Urged water exercise.          Return for re-evaluation, pending next visit with Dr. Leafy Ro.   Fara Chute, PA-C [facility] [place]

## 2016-11-24 NOTE — Patient Instructions (Addendum)
JOIN A POOL. INCREASE THE EXERCISE IN THE POOL.  IF you received an x-ray today, you will receive an invoice from [facility]. Please contact [facility] at [phone] with questions or concerns regarding your invoice.   IF you received labwork today, you will receive an invoice from [facility]. Please contact [facility] at [phone] with questions or concerns regarding your invoice.   Our billing staff will not be able to assist you with questions regarding bills from these companies.  You will be contacted with the lab results as soon as they are available. The fastest way to get your results is to activate your My Chart account. Instructions are located on the last page of this paperwork. If you have not heard from [place] regarding the results in 2 weeks, please contact this office.

## 2016-11-24 NOTE — Progress Notes (Signed)
Subjective:    Patient ID: Sarah Duncan, female    DOB: 1965/11/25, 51 y.o.   MRN: [id number] PCP: Harrison Mons, PA-C  HPI Presenting for orthopedics follow-up. MRI showed disc degeneration at L4-5 level without nerve root compression. Increased physical activity and discontinuation of Lyrica recommended to improve weight loss.  She has back pain daily, mostly in her middle to lower back. She cannot walk, sit, or stand for long periods. Even watching TV she will find herself shifting her weight. She has tried traction therapy with PT and had to lay in bed for several days afterward due to the pain. Severity is 7-8/10. She feels frustrated because the Lyrica helps with her back pain, but she is being instructed to lose weight and discontinue it, which she believes will lead to increased back pain. She is no longer taking acetaminophen for pain but is currently on Bupropion, Flexeril, and Lyrica. [facility], although her encounter notes 35% compliance to her eating plan.  She has an emotional support dog now (since January). She needs a new letter on letterhead specifying the dog breed type for it to be deemed an official emotional support dog. It has been helpful. When asked about her mood, she states she feels she is not good enough for her wife because she doesn't work. She bottles up her feelings but states she is able to talk to her wife. Denies SI/HI.  She was recently switched to Wellbutrin 200mg  SR once daily by Dr. Leafy Ro.    Patient Active Problem List   Diagnosis Date Noted  . Class 1 obesity without serious comorbidity with body mass index (BMI) of 34.0 to 34.9 in adult 10/18/2016  . Prediabetes 10/18/2016  . Vitamin D deficiency 10/18/2016  . BMI 34.0-34.9,adult 11/25/2015  . Depression 11/23/2015  . 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 Chronic lower back pain 06/18/2015   Prior to Admission medications     Medication Sig Start Date End Date Taking? Authorizing Provider  BIOTIN PO Take by mouth daily.   Yes [provider]  buPROPion (WELLBUTRIN SR) 200 MG 12 hr tablet Take 1 tablet (200 mg total) by mouth daily. 11/07/16  Yes Beasley, Caren D, MD  cyclobenzaprine (FLEXERIL) 10 MG tablet Take 0.5-1 tablets (5-10 mg total) by mouth 3 (three) times daily as needed for muscle spasms. 10/03/16  Yes Jeffery, Chelle, PA-C  escitalopram (LEXAPRO) 20 MG tablet TAKE 1 TABLET BY MOUTH ONCE DAILY 11/15/16  Yes Jeffery, Chelle, PA-C  MELATONIN PO Take by mouth daily.   Yes [provider]  Multiple Vitamin (MULTIVITAMIN) tablet Take 1 tablet by mouth daily.   Yes [provider]  pregabalin (LYRICA) 200 MG capsule Take 1 capsule (200 mg total) by mouth 3 (three) times daily. 10/03/16  Yes Jeffery, Chelle, PA-C  Vitamin D, Ergocalciferol, (DRISDOL) 50000 units CAPS capsule Take 1 capsule (50,000 Units total) by mouth every 7 (seven) days. 11/07/16  Yes Dennard Nip D, MD   No Known Allergies   Review of Systems  Respiratory: Negative for shortness of breath.   Cardiovascular: Negative for chest pain.  Musculoskeletal: Negative for arthralgias, neck pain and neck stiffness.      Objective:   Physical Exam  Constitutional: She appears well-developed and well-nourished.  BP 120/81   Pulse 84   Temp (!) 97.5 F (36.4 C) (Oral)   Resp 16   Ht 5\' 7"  (1.702 m)   Wt 229 lb 3.2  oz (104 kg)   SpO2 100%   BMI 35.90 kg/m    HENT:  Head: Normocephalic and atraumatic.  Right Ear: External ear normal.  Left Ear: External ear normal.  Nose: Nose normal.  Eyes: EOM are normal.  Neck: Normal range of motion. Neck supple.  Cardiovascular: Normal rate, regular rhythm and normal heart sounds.   Pulmonary/Chest: Effort normal and breath sounds normal.  Musculoskeletal: She exhibits tenderness. She exhibits no edema.  Tender to palpation at mid and low back extending to hips.  Skin:  Skin is warm and dry.  Psychiatric: She has a normal mood and affect. Her behavior is normal.  Patient becomes visibly teary-eyed when discussing depression symptoms.      Assessment & Plan:   1. Chronic low back pain, unspecified back pain laterality, with sciatica presence unspecified Continue current regimen for now. See below for plan.  2. Other depression Continue current regimen for now. Will reach out to Dr. Leafy Ro to discuss dosing and frequency of wellbutrin. May consider continuing lyrica but increasing the dose of wellbutrin to improve mood and emotional eating behaviors, making weight loss more attainable. Alternatively, may transition from wellbutrin and lexapro to cymbalta. IAt that point, may discontinue lyrica to see if back pain is still well-controlled.  3. BMI 34.0-34.9,adult Continue attendance at [facility].

## 2016-11-25 NOTE — Assessment & Plan Note (Signed)
Discussed potentially stopping pregabalin and trying duloxetine. Will reach out to Dr. Leafy Ro regarding the bupropion, as duloxetine would require D/C bupropion, in addition to escitalopram.

## 2016-11-25 NOTE — Assessment & Plan Note (Signed)
Improved with escitalopram and bupropion, but not resolved. Back pain continues to be a major contributor and obstacle to improvement. Consider cross-tapering of escitalopram and bupropion with duloxetine, which may address both depression and her chronic pain. Will reach out to Dr. Leafy Ro before making changes. If bupropion should be continued, to address the emotional eating, would wonder if SR BID dosing or increasing XL formulation to 300 mg QD would help better address the depression symptoms.

## 2016-11-25 NOTE — Assessment & Plan Note (Signed)
Await guidance from Dr. Leafy Ro regarding change in bupropion (either increase dose or change to duloxetine). Stressed the benefit of weight loss and core strengthening in reducing her back pain. Urged water exercise.

## 2016-12-04 ENCOUNTER — Ambulatory Visit (INDEPENDENT_AMBULATORY_CARE_PROVIDER_SITE_OTHER): Payer: 59 | Admitting: Family Medicine

## 2016-12-04 VITALS — BP 112/75 | HR 78 | Temp 98.1°F | Ht 67.0 in | Wt 230.0 lb

## 2016-12-04 DIAGNOSIS — Z6836 Body mass index (BMI) 36.0-36.9, adult: Secondary | ICD-10-CM | POA: Diagnosis not present

## 2016-12-04 DIAGNOSIS — Z6834 Body mass index (BMI) 34.0-34.9, adult: Secondary | ICD-10-CM | POA: Diagnosis not present

## 2016-12-04 DIAGNOSIS — M545 Low back pain: Secondary | ICD-10-CM | POA: Diagnosis not present

## 2016-12-04 DIAGNOSIS — F3289 Other specified depressive episodes: Secondary | ICD-10-CM

## 2016-12-04 DIAGNOSIS — G8929 Other chronic pain: Secondary | ICD-10-CM | POA: Diagnosis not present

## 2016-12-04 DIAGNOSIS — E669 Obesity, unspecified: Secondary | ICD-10-CM | POA: Diagnosis not present

## 2016-12-04 MED ORDER — ACETAMINOPHEN 325 MG PO TABS: 650.0000 mg | ORAL_TABLET | Freq: Four times a day (QID) | ORAL | Status: DC | PRN

## 2016-12-04 MED ORDER — PREGABALIN 200 MG PO CAPS: 200.0000 mg | ORAL_CAPSULE | Freq: Two times a day (BID) | ORAL | 0 refills | Status: DC

## 2016-12-04 NOTE — Progress Notes (Signed)
Rx for Lyrica was printed today but not given to pt and has been shredded

## 2016-12-04 NOTE — Progress Notes (Signed)
Office: [phone]  /  Fax: [phone]   HPI:   Chief Complaint: OBESITY Sarah Duncan is here to discuss her progress with her obesity treatment plan. She is on the  follow the Category 2 plan and is following her eating plan approximately 85 % of the time. She states she is exercising 0 minutes 0 times per week. Sarah Duncan has struggled with weight, states hunger/cravings are well controlled but noticed weight gain is due to lyrica, and would like to decrease dose. Her weight is 230 lb (104.3 kg) today and has had a weight gain of 2 pounds over a period of 4 weeks since her last visit. She has gained 6 lbs since starting treatment with [place].  Chronic low back pain Pain is well under control with lyrica, but she states she would like to decrease decrease lyrica due to side effects of weight gain.  Depression with emotional eating behaviors Sarah Duncan is tearful in office today due to increase in life stressors with sick family member. Sarah Duncan struggles with emotional eating and using food for comfort to the extent that it is negatively impacting her health. She often snacks when she is not hungry. Sarah Duncan sometimes feels she is out of control and then feels guilty that she made poor food choices. She has been working on behavior modification techniques to help reduce her emotional eating and has been somewhat successful. She shows no sign of suicidal or homicidal ideations.  Depression screen [facility] 2/9 11/24/2016 10/03/2016 08/24/2016 07/18/2016 05/23/2016  Decreased Interest 0 0 1 0 0  Down, Depressed, Hopeless 0 0 2 0 0  PHQ - 2 Score 0 0 3 0 0  Altered sleeping - - 0 - -  Tired, decreased energy - - 1 - -  Change in appetite - - 1 - -  Feeling bad or failure about yourself  - - 0 - -  Trouble concentrating - - 0 - -  Moving slowly or fidgety/restless - - 0 - -  Suicidal thoughts - - 0 - -  PHQ-9 Score - - 5 - -  Difficult doing work/chores - - - - -     ALLERGIES: No Known  Allergies  MEDICATIONS: Current Outpatient Prescriptions on File Prior to Visit  Medication Sig Dispense Refill  . BIOTIN PO Take by mouth daily.    Marland Kitchen buPROPion (WELLBUTRIN SR) 200 MG 12 hr tablet Take 1 tablet (200 mg total) by mouth daily. 30 tablet 0  . cyclobenzaprine (FLEXERIL) 10 MG tablet Take 0.5-1 tablets (5-10 mg total) by mouth 3 (three) times daily as needed for muscle spasms. 90 tablet 0  . escitalopram (LEXAPRO) 20 MG tablet TAKE 1 TABLET BY MOUTH ONCE DAILY 30 tablet 3  . MELATONIN PO Take by mouth daily.    . Multiple Vitamin (MULTIVITAMIN) tablet Take 1 tablet by mouth daily.    . Vitamin D, Ergocalciferol, (DRISDOL) 50000 units CAPS capsule Take 1 capsule (50,000 Units total) by mouth every 7 (seven) days. 4 capsule 0   No current facility-administered medications on file prior to visit.     PAST MEDICAL HISTORY: Past Medical History:  Diagnosis Date  . Anemia   . Anxiety   . Arthritis   . Constipation   . Depression   . Fibroids   . 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 Lactose intolerance   . Palpitations   . Shortness of breath   . 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 ABDOMINAL HYSTERECTOMY  2007  . CESAREAN SECTION  11/19/1989  . 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 SPINE SURGERY  07/13/2015   Dr. Christia Reading  . TUBAL LIGATION  05/09/1993    SOCIAL HISTORY: Social History  Substance Use Topics  . Smoking status: Former Smoker    Years: 1.00    Types: Cigarettes  . Smokeless tobacco: Never Used  . Alcohol use 0.6 oz/week    1 Standard drinks or equivalent per week     Comment: social    FAMILY HISTORY: Family History  Problem Relation Age of Onset  . Diabetes Mother   . Hypertension Mother   . Diabetes Brother   . Hypertension Brother   . Cancer Father   . Endometriosis Daughter   . Diabetes Daughter   . HIV  Son     ROS: Review of Systems  Constitutional: Negative for weight loss.  Musculoskeletal: Positive for back pain.  Psychiatric/Behavioral: Positive for depression. Negative for suicidal ideas.    PHYSICAL EXAM: Blood pressure 112/75, pulse 78, temperature 98.1 F (36.7 C), temperature source Oral, height 5\' 7"  (1.702 m), weight 230 lb (104.3 kg), SpO2 99 %. Body mass index is 36.02 kg/m. Physical Exam  Constitutional: She is oriented to person, place, and time. She appears well-developed and well-nourished.  Cardiovascular: Normal rate.   Pulmonary/Chest: Effort normal.  Musculoskeletal: Normal range of motion.  Neurological: She is oriented to person, place, and time.  Skin: Skin is warm and dry.  Vitals reviewed.   RECENT LABS AND TESTS: BMET    Component Value Date/Time   NA 141 10/03/2016 1025   K 4.4 10/03/2016 1025   CL 104 10/03/2016 1025   CO2 23 10/03/2016 1025   GLUCOSE 92 10/03/2016 1025   GLUCOSE 103 (H) 07/24/2016 1121   BUN 10 10/03/2016 1025   CREATININE 1.00 10/03/2016 1025   CREATININE 1.10 07/24/2016 1121   CALCIUM 9.5 10/03/2016 1025   GFRNONAA 67 10/03/2016 1025   GFRAA 77 10/03/2016 1025   Lab Results  Component Value Date   HGBA1C 6.0 (H) 08/24/2016   Lab Results  Component Value Date   INSULIN 31.7 (H) 08/24/2016   CBC    Component Value Date/Time   WBC 7.5 08/24/2016 1018   WBC 6.1 10/12/2015 1445   RBC 4.77 08/24/2016 1018   RBC 4.85 10/12/2015 1445   HGB 13.5 08/24/2016 1018   HCT 41.2 08/24/2016 1018   PLT 276 08/24/2016 1018   MCV 86 08/24/2016 1018   MCH 28.3 08/24/2016 1018   MCH 28.2 10/12/2015 1445   MCHC 32.8 08/24/2016 1018   MCHC 33.6 10/12/2015 1445   RDW 14.6 08/24/2016 1018   LYMPHSABS 3.5 (H) 08/24/2016 1018   MONOABS 610 10/12/2015 1445   EOSABS 0.2 08/24/2016 1018   BASOSABS 0.0 08/24/2016 1018   Iron/TIBC/Ferritin/ %Sat No results found for: IRON, TIBC, FERRITIN, IRONPCTSAT Lipid Panel     Component  Value Date/Time   CHOL 185 08/24/2016 1018   TRIG 127 08/24/2016 1018   HDL 49 08/24/2016 1018   CHOLHDL 3.6 10/12/2015 1445   VLDL 19 10/12/2015 1445   LDLCALC 111 (H) 08/24/2016 1018   Hepatic Function Panel     Component Value Date/Time   PROT 7.6 08/24/2016 1018   ALBUMIN 4.1 08/24/2016 1018   AST 15 08/24/2016 1018   ALT 10 08/24/2016 1018  ALKPHOS 116 08/24/2016 1018   BILITOT <0.2 08/24/2016 1018      Component Value Date/Time   TSH 1.940 08/24/2016 1018   TSH 3.92 07/24/2016 1121   TSH 2.79 10/12/2015 1445    ASSESSMENT AND PLAN: Other chronic pain - low back - Plan: acetaminophen (TYLENOL) 325 MG tablet  Other depression  Class 2 obesity without serious comorbidity with body mass index (BMI) of 36.0 to 36.9 in adult, unspecified obesity type  Chronic low back pain, unspecified back pain laterality, with sciatica presence unspecified - Plan: pregabalin (LYRICA) 200 MG capsule  PLAN:  Chronic low back pain Shanyn agrees to decrease lyrica to 200 mg bid and take tylenol 650 mg tid and follow up with Ortho. Karista agrees to follow up with our clinic in 2 weeks.  Depression with Emotional Eating Behaviors We discussed behavior modification techniques today to help Carla deal with her emotional eating and depression. She has agreed to continue to take Wellbutrin SR 150 mg qd and agreed to follow up as directed.  Obesity Letita is currently in the action stage of change. As such, her goal is to continue with weight loss efforts She has agreed to follow the Category 2 plan Alys has been instructed to work up to a goal of 150 minutes of combined cardio and strengthening exercise per week for weight loss and overall health benefits. We discussed the following Behavioral Modification Strategies today: increasing lean protein intake and emotional eating strategies  Jarita has agreed to follow up with our clinic in 2 weeks. She was informed of the importance of frequent  follow up visits to maximize her success with intensive lifestyle modifications for her multiple health conditions.  I, Doreene Nest, am acting as transcriptionist for Dennard Nip, MD  I have reviewed the above documentation for accuracy and completeness, and I agree with the above. -Dennard Nip, MD  OBESITY BEHAVIORAL INTERVENTION VISIT  Today's visit was # 6 out of 22.  Starting weight: 224 lbs Starting date: 08/24/16 Today's weight : 230 lbs Today's date: 12/04/2016 Total lbs lost to date: 0 (Patients must lose 7 lbs in the first 6 months to continue with counseling)   ASK: We discussed the diagnosis of obesity with Sarah Duncan today and Sarah Duncan agreed to give [place] permission to discuss obesity behavioral modification therapy today.  ASSESS: Sarah Duncan has the diagnosis of obesity and her BMI today is 36.1 Sarah Duncan is in the action stage of change   ADVISE: Sarah Duncan was educated on the multiple health risks of obesity as well as the benefit of weight loss to improve her health. She was advised of the need for long term treatment and the importance of lifestyle modifications.  AGREE: Multiple dietary modification options and treatment options were discussed and  Sarah Duncan agreed to follow the Category 2 plan We discussed the following Behavioral Modification Strategies today: increasing lean protein intake and emotional eating strategies

## 2016-12-18 ENCOUNTER — Telehealth (INDEPENDENT_AMBULATORY_CARE_PROVIDER_SITE_OTHER): Payer: Self-pay | Admitting: Family Medicine

## 2016-12-18 ENCOUNTER — Ambulatory Visit (INDEPENDENT_AMBULATORY_CARE_PROVIDER_SITE_OTHER): Payer: 59 | Admitting: Physician Assistant

## 2016-12-18 VITALS — BP 120/84 | HR 96 | Temp 98.8°F | Ht 67.0 in | Wt 237.0 lb

## 2016-12-18 DIAGNOSIS — Z6837 Body mass index (BMI) 37.0-37.9, adult: Secondary | ICD-10-CM

## 2016-12-18 DIAGNOSIS — IMO0001 Reserved for inherently not codable concepts without codable children: Secondary | ICD-10-CM

## 2016-12-18 DIAGNOSIS — E559 Vitamin D deficiency, unspecified: Secondary | ICD-10-CM | POA: Diagnosis not present

## 2016-12-18 DIAGNOSIS — Z9189 Other specified personal risk factors, not elsewhere classified: Secondary | ICD-10-CM

## 2016-12-18 DIAGNOSIS — E669 Obesity, unspecified: Secondary | ICD-10-CM

## 2016-12-18 DIAGNOSIS — F3289 Other specified depressive episodes: Secondary | ICD-10-CM

## 2016-12-18 MED ORDER — VITAMIN D (ERGOCALCIFEROL) 1.25 MG (50000 UNIT) PO CAPS: 50000.0000 [IU] | ORAL_CAPSULE | ORAL | 0 refills | Status: DC

## 2016-12-18 MED ORDER — ESCITALOPRAM OXALATE 20 MG PO TABS: 20.0000 mg | ORAL_TABLET | Freq: Every day | ORAL | 0 refills | Status: DC

## 2016-12-18 MED ORDER — BUPROPION HCL ER (SR) 200 MG PO TB12: 200.0000 mg | ORAL_TABLET | Freq: Every day | ORAL | 0 refills | Status: DC

## 2016-12-18 MED FILL — BUPROPION HCL SR 200 MG TAB: 200 | 30 days supply | Qty: 30 | Fill #0

## 2016-12-18 MED FILL — VIT D2 1.25 MG (50,000 UNIT: 1.25 MG | 28 days supply | Qty: 4 | Fill #0

## 2016-12-18 MED FILL — ESCITALOPRAM 20 MG TABLET: 20 | 30 days supply | Qty: 30 | Fill #0

## 2016-12-18 NOTE — Telephone Encounter (Signed)
error 

## 2016-12-18 NOTE — Progress Notes (Signed)
Office: [phone]  /  Fax: [phone]   HPI:   Chief Complaint: OBESITY Sarah Duncan is here to discuss her progress with her obesity treatment plan. She is on the  follow the Category 2 plan and is following her eating plan approximately 85 % of the time. She states she is swimming 2 times per week. Sarah Duncan is currently struggling with increase emotional eating and has not been planning ahead as well. Also, has been eating out more.  Her weight is 237 lb (107.5 kg) today and has gained 7 pounds since her last visit. She has lost 7 lbs since starting treatment with [place].  Vitamin D deficiency Sarah Duncan has a diagnosis of vitamin D deficiency. She is currently taking vit D and denies nausea, vomiting or muscle weakness.  Depression with emotional eating behaviors Sarah Duncan is struggling with emotional eating and using food for comfort to the extent that it is negatively impacting her health. She often snacks when she is not hungry. Sarah Duncan sometimes feels she is out of control and then feels guilty that she made poor food choices. She has been working on behavior modification techniques to help reduce her emotional eating and has been somewhat successful. She shows no sign of suicidal or homicidal ideations.  Depression screen Sarah Duncan 2/9 11/24/2016 10/03/2016 08/24/2016 07/18/2016 05/23/2016  Decreased Interest 0 0 1 0 0  Down, Depressed, Hopeless 0 0 2 0 0  PHQ - 2 Score 0 0 3 0 0  Altered sleeping - - 0 - -  Tired, decreased energy - - 1 - -  Change in appetite - - 1 - -  Feeling bad or failure about yourself  - - 0 - -  Trouble concentrating - - 0 - -  Moving slowly or fidgety/restless - - 0 - -  Suicidal thoughts - - 0 - -  PHQ-9 Score - - 5 - -  Difficult doing work/chores - - - - -    At risk for Osteopenia Sarah Duncan is at higher risk of osteopenia and osteoporosis due to vitamin D deficiency.     ALLERGIES: No Known Allergies  MEDICATIONS: Current Outpatient Prescriptions on File Prior to Visit    Medication Sig Dispense Refill  . acetaminophen (TYLENOL) 325 MG tablet Take 2 tablets (650 mg total) by mouth every 6 (six) hours as needed.    Marland Kitchen BIOTIN PO Take by mouth daily.    Marland Kitchen buPROPion (WELLBUTRIN SR) 200 MG 12 hr tablet Take 1 tablet (200 mg total) by mouth daily. 30 tablet 0  . cyclobenzaprine (FLEXERIL) 10 MG tablet Take 0.5-1 tablets (5-10 mg total) by mouth 3 (three) times daily as needed for muscle spasms. 90 tablet 0  . escitalopram (LEXAPRO) 20 MG tablet TAKE 1 TABLET BY MOUTH ONCE DAILY 30 tablet 3  . MELATONIN PO Take by mouth daily.    . Multiple Vitamin (MULTIVITAMIN) tablet Take 1 tablet by mouth daily.    . pregabalin (LYRICA) 200 MG capsule Take 1 capsule (200 mg total) by mouth 2 (two) times daily. 90 capsule 0  . Vitamin D, Ergocalciferol, (DRISDOL) 50000 units CAPS capsule Take 1 capsule (50,000 Units total) by mouth every 7 (seven) days. 4 capsule 0   No current facility-administered medications on file prior to visit.     PAST MEDICAL HISTORY: Past Medical History:  Diagnosis Date  . Anemia   . Anxiety   . Arthritis   . Constipation   . Depression   . Fibroids   .  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 Lactose intolerance   . Palpitations   . Shortness of breath   . 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 ABDOMINAL HYSTERECTOMY  2007  . CESAREAN SECTION  11/19/1989  . 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 SPINE SURGERY  07/13/2015   Dr. Christia Reading  . TUBAL LIGATION  05/09/1993    SOCIAL HISTORY: Social History  Substance Use Topics  . Smoking status: Former Smoker    Years: 1.00    Types: Cigarettes  . Smokeless tobacco: Never Used  . Alcohol use 0.6 oz/week    1 Standard drinks or equivalent per week     Comment: social    FAMILY HISTORY: Family History  Problem Relation Age of Onset  . Diabetes Mother   .  Hypertension Mother   . Diabetes Brother   . Hypertension Brother   . Cancer Father   . Endometriosis Daughter   . Diabetes Daughter   . HIV Son     ROS: Review of Systems  Gastrointestinal: Negative for nausea and vomiting.  Musculoskeletal:       Negative muscle weakness  Psychiatric/Behavioral: Positive for depression. Negative for suicidal ideas.    PHYSICAL EXAM: Blood pressure 120/84, pulse 96, temperature 98.8 F (37.1 C), temperature source Oral, height 5\' 7"  (1.702 m), weight 237 lb (107.5 kg), SpO2 99 %. Body mass index is 37.12 kg/m. Physical Exam  Constitutional: She is oriented to person, place, and time. She appears well-developed and well-nourished.  Cardiovascular: Normal rate.   Pulmonary/Chest: Effort normal.  Musculoskeletal: Normal range of motion.  Neurological: She is alert and oriented to person, place, and time.  Skin: Skin is warm and dry.  Psychiatric: She has a normal mood and affect.    RECENT LABS AND TESTS: BMET    Component Value Date/Time   NA 141 10/03/2016 1025   K 4.4 10/03/2016 1025   CL 104 10/03/2016 1025   CO2 23 10/03/2016 1025   GLUCOSE 92 10/03/2016 1025   GLUCOSE 103 (H) 07/24/2016 1121   BUN 10 10/03/2016 1025   CREATININE 1.00 10/03/2016 1025   CREATININE 1.10 07/24/2016 1121   CALCIUM 9.5 10/03/2016 1025   GFRNONAA 67 10/03/2016 1025   GFRAA 77 10/03/2016 1025   Lab Results  Component Value Date   HGBA1C 6.0 (H) 08/24/2016   Lab Results  Component Value Date   INSULIN 31.7 (H) 08/24/2016   CBC    Component Value Date/Time   WBC 7.5 08/24/2016 1018   WBC 6.1 10/12/2015 1445   RBC 4.77 08/24/2016 1018   RBC 4.85 10/12/2015 1445   HGB 13.5 08/24/2016 1018   HCT 41.2 08/24/2016 1018   PLT 276 08/24/2016 1018   MCV 86 08/24/2016 1018   MCH 28.3 08/24/2016 1018   MCH 28.2 10/12/2015 1445   MCHC 32.8 08/24/2016 1018   MCHC 33.6 10/12/2015 1445   RDW 14.6 08/24/2016 1018   LYMPHSABS 3.5 (H) 08/24/2016 1018     MONOABS 610 10/12/2015 1445   EOSABS 0.2 08/24/2016 1018   BASOSABS 0.0 08/24/2016 1018   Iron/TIBC/Ferritin/ %Sat No results found for: IRON, TIBC, FERRITIN, IRONPCTSAT Lipid Panel     Component Value Date/Time   CHOL 185 08/24/2016 1018   TRIG 127 08/24/2016 1018   HDL 49 08/24/2016 1018   CHOLHDL 3.6 10/12/2015 1445  VLDL 19 10/12/2015 1445   LDLCALC 111 (H) 08/24/2016 1018   Hepatic Function Panel     Component Value Date/Time   PROT 7.6 08/24/2016 1018   ALBUMIN 4.1 08/24/2016 1018   AST 15 08/24/2016 1018   ALT 10 08/24/2016 1018   ALKPHOS 116 08/24/2016 1018   BILITOT <0.2 08/24/2016 1018      Component Value Date/Time   TSH 1.940 08/24/2016 1018   TSH 3.92 07/24/2016 1121   TSH 2.79 10/12/2015 1445    ASSESSMENT AND PLAN: Vitamin D deficiency - Plan: Vitamin D, Ergocalciferol, (DRISDOL) 50000 units CAPS capsule  Other depression - Plan: escitalopram (LEXAPRO) 20 MG tablet, buPROPion (WELLBUTRIN SR) 200 MG 12 hr tablet  At risk for osteoporosis  Class 2 obesity with serious comorbidity and body mass index (BMI) of 37.0 to 37.9 in adult, unspecified obesity type  PLAN:  Vitamin D Deficiency Nikiah was informed that low vitamin D levels contributes to fatigue and are associated with obesity, breast, and colon cancer. She agrees to continue to take prescription Vit D @50 ,000 IU every week, refill was written today, and will follow up for routine testing of vitamin D, at least 2-3 times per year. She was informed of the risk of over-replacement of vitamin D and agrees to not increase her dose unless he discusses this with [place] first.  Depression with Emotional Eating Behaviors We discussed behavior modification techniques today to help Sarah Duncan deal with her emotional eating and depression. She has agreed to continue to take Lexapro 20 mg qd and Wellbutrin SR 200 mg qd, refill written today for both, and agreed to follow up as directed.  At risk for  osteopenia Sarah Duncan is at risk for osteopenia and osteoporsis due to her vitamin D deficiency. She was encouraged to take her vitamin D and follow her higher calcium diet and increase strengthening exercise to help strengthen her bones and decrease her risk of osteopenia and osteoporosis.  Obesity Sarah Duncan is currently in the action stage of change. As such, her goal is to continue with weight loss efforts She has agreed to follow the Category 2 plan Sarah Duncan has been instructed to work up to a goal of 150 minutes of combined cardio and strengthening exercise per week for weight loss and overall health benefits. We discussed the following Behavioral Modification Stratagies today: decrease eating out, work on meal planning and easy cooking plans and emotional eating strategies   Sarah Duncan has agreed to follow up with our clinic in 2 weeks. She was informed of the importance of frequent follow up visits to maximize her success with intensive lifestyle modifications for her multiple health conditions.   Office: [phone]  /  Fax: [phone]  OBESITY BEHAVIORAL INTERVENTION VISIT  Today's visit was # 7 out of 22.  Starting weight: 224 Starting date: 08/24/16 Today's weight : Weight: 237 lb (107.5 kg)  Today's date: 12/18/2016 Total lbs lost to date: 7 (Patients must lose 7 lbs in the first 6 months to continue with counseling)   ASK: We discussed the diagnosis of obesity with Sarah Duncan today and Sarah Duncan agreed to give [place] permission to discuss obesity behavioral modification therapy today.  ASSESS: Sarah Duncan has the diagnosis of obesity and her BMI today is 37.2 Sarah Duncan is in the action stage of change   ADVISE: Sarah Duncan was educated on the multiple health risks of obesity as well as the benefit of weight loss to improve her health. She was advised of the need for long term  treatment and the importance of lifestyle modifications.  AGREE: Multiple dietary modification options and treatment  options were discussed and  Sarah Duncan agreed to follow the Category 2 plan We discussed the following Behavioral Modification Stratagies today: decrease eating out, work on meal planning and easy cooking plans and emotional eating strategies    I have reviewed the above documentation for accuracy and completeness, and I agree with the above. -Sarah Duncan Duverney, PA-C  I have reviewed the above note and agree with the plan. -Dennard Nip, MD

## 2016-12-19 ENCOUNTER — Other Ambulatory Visit: Payer: Self-pay | Admitting: Physician Assistant

## 2016-12-19 ENCOUNTER — Other Ambulatory Visit (INDEPENDENT_AMBULATORY_CARE_PROVIDER_SITE_OTHER): Payer: Self-pay | Admitting: Family Medicine

## 2016-12-19 DIAGNOSIS — G8929 Other chronic pain: Secondary | ICD-10-CM

## 2016-12-19 DIAGNOSIS — M545 Low back pain: Principal | ICD-10-CM

## 2016-12-20 MED FILL — CYCLOBENZAPRINE 10 MG TABLE: 10 | 30 days supply | Qty: 90 | Fill #0

## 2016-12-26 ENCOUNTER — Ambulatory Visit (INDEPENDENT_AMBULATORY_CARE_PROVIDER_SITE_OTHER): Payer: 59 | Admitting: Orthopaedic Surgery

## 2017-01-01 ENCOUNTER — Ambulatory Visit (INDEPENDENT_AMBULATORY_CARE_PROVIDER_SITE_OTHER): Payer: 59 | Admitting: Physician Assistant

## 2017-01-05 ENCOUNTER — Ambulatory Visit: Payer: 59 | Admitting: Physician Assistant

## 2017-01-08 ENCOUNTER — Encounter: Payer: Self-pay | Admitting: Physician Assistant

## 2017-01-08 ENCOUNTER — Ambulatory Visit (INDEPENDENT_AMBULATORY_CARE_PROVIDER_SITE_OTHER): Payer: 59 | Admitting: Physician Assistant

## 2017-01-08 VITALS — BP 128/79 | HR 97 | Temp 98.3°F | Ht 67.0 in | Wt 227.0 lb

## 2017-01-08 DIAGNOSIS — IMO0001 Reserved for inherently not codable concepts without codable children: Secondary | ICD-10-CM | POA: Insufficient documentation

## 2017-01-08 DIAGNOSIS — M5441 Lumbago with sciatica, right side: Secondary | ICD-10-CM | POA: Diagnosis not present

## 2017-01-08 DIAGNOSIS — M5442 Lumbago with sciatica, left side: Secondary | ICD-10-CM

## 2017-01-08 DIAGNOSIS — E669 Obesity, unspecified: Secondary | ICD-10-CM

## 2017-01-08 DIAGNOSIS — F3289 Other specified depressive episodes: Secondary | ICD-10-CM

## 2017-01-08 DIAGNOSIS — G8929 Other chronic pain: Secondary | ICD-10-CM

## 2017-01-08 DIAGNOSIS — Z6835 Body mass index (BMI) 35.0-35.9, adult: Secondary | ICD-10-CM | POA: Diagnosis not present

## 2017-01-08 DIAGNOSIS — M545 Low back pain: Secondary | ICD-10-CM | POA: Diagnosis not present

## 2017-01-08 MED ORDER — CYCLOBENZAPRINE HCL 10 MG PO TABS: ORAL_TABLET | ORAL | 2 refills | Status: DC

## 2017-01-08 NOTE — Patient Instructions (Addendum)
When you are at the [facility], ask about changing from Wellbutrin and Lexapro to Cymbalta. I am happy to make the change IF they are OK with it from a weight loss stand point.  If not, I'll plan to refer you to an alternate orthopedic doctor.    IF you received an x-ray today, you will receive an invoice from [facility]. Please contact [facility] at [phone] with questions or concerns regarding your invoice.   IF you received labwork today, you will receive an invoice from [place]. Please contact [facility] at [phone] with questions or concerns regarding your invoice.   Our billing staff will not be able to assist you with questions regarding bills from these companies.  You will be contacted with the lab results as soon as they are available. The fastest way to get your results is to activate your My Chart account. Instructions are located on the last page of this paperwork. If you have not heard from [place] regarding the results in 2 weeks, please contact this office.

## 2017-01-08 NOTE — Progress Notes (Signed)
Patient ID: Sarah Duncan, female    DOB: 11-30-1965, 51 y.o.   MRN: [id number]  PCP: Harrison Mons, PA-C  Chief Complaint  Patient presents with  . Medication Refill    Lyrica 200 MG  . Depression    Depression scale score 15    Subjective:   Presents for medication refills  She initially scheduled this visit for refill of pregabalin. In the meantime, she hs weaned herself OFF the medication. She has needed increased cyclobenzaprine since then, and needs a refill of that instead.  My whole body hurts. Even my fingers hurt. I've been suffering from headaches recently. Since I weaned myself off the Lyrica I've lost 8 lbs. I'm taking almost 6 Flexeril per day. I know that's too much.  Acetaminophen isn't adequate. Negative impact on mood. Has had daily pain x 2 years. Very frustrated. Feels like her orthopedic doctor isn't listening. "He keeps looking at the same MRI." PT increased her pain.   Review of Systems Pain everywhere. No CP, SOB, dizziness. No nausea, vomiting, diarrhea.    Patient Active Problem List   Diagnosis Date Noted  . Other chronic pain 12/04/2016  . Class 2 obesity without serious comorbidity with body mass index (BMI) of 36.0 to 36.9 in adult 12/04/2016  . Class 1 obesity without serious comorbidity with body mass index (BMI) of 34.0 to 34.9 in adult 10/18/2016  . Prediabetes 10/18/2016  . Vitamin D deficiency 10/18/2016  . BMI 34.0-34.9,adult 11/25/2015  . Depression 11/23/2015  . 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 Chronic lower back pain 06/18/2015     Prior to Admission medications   Medication Sig Start Date End Date Taking? Authorizing Provider  acetaminophen (TYLENOL) 325 MG tablet Take 2 tablets (650 mg total) by mouth every 6 (six) hours as needed. 12/04/16  Yes Beasley, Caren D, MD  buPROPion (WELLBUTRIN SR) 200 MG 12 hr tablet Take 1 tablet (200 mg total) by mouth daily. 12/18/16  Yes Beasley, Caren D, MD  cyclobenzaprine  (FLEXERIL) 10 MG tablet TAKE 1/2-1 TABLETS (5-10 MG TOTAL) BY MOUTH 3 TIMES DAILY AS NEEDED FOR MUSCLE SPASMS. 12/20/16  Yes Griselle Rufer, PA-C  escitalopram (LEXAPRO) 20 MG tablet Take 1 tablet (20 mg total) by mouth daily. 12/18/16  Yes Dennard Nip D, MD  MELATONIN PO Take by mouth daily.   Yes [provider]  pregabalin (LYRICA) 200 MG capsule Take 1 capsule (200 mg total) by mouth 2 (two) times daily. 12/04/16   Dennard Nip D, MD  Vitamin D, Ergocalciferol, (DRISDOL) 50000 units CAPS capsule Take 1 capsule (50,000 Units total) by mouth every 7 (seven) days. 12/18/16  Yes Dennard Nip D, MD  BIOTIN PO Take by mouth daily.    [provider]  Multiple Vitamin (MULTIVITAMIN) tablet Take 1 tablet by mouth daily.    [provider]     No Known Allergies     Objective:  Physical Exam  Constitutional: She is oriented to person, place, and time. She appears well-developed and well-nourished. She is active and cooperative. No distress.  BP 126/84 (BP Location: Right Arm, Patient Position: Sitting, Cuff Size: Large)   Pulse 90   Temp 97.7 F (36.5 C) (Oral)   Resp 18   Ht 5\' 7"  (1.702 m)   Wt 229 lb 13.9 oz (104.3 kg)   SpO2 97%   BMI 36.00 kg/m   HENT:  Head: Normocephalic and atraumatic.  Right Ear: Hearing normal.  Left Ear: Hearing normal.  Eyes: Conjunctivae are normal. No scleral icterus.  Neck: Normal range of motion. Neck supple. No thyromegaly present.  Cardiovascular: Normal rate, regular rhythm and normal heart sounds.   Pulses:      Radial pulses are 2+ on the right side, and 2+ on the left side.  Pulmonary/Chest: Effort normal and breath sounds normal.  Musculoskeletal:       Cervical back: Normal.       Thoracic back: She exhibits tenderness.       Lumbar back: She exhibits tenderness.  Lymphadenopathy:       Head (right side): No tonsillar, no preauricular, no posterior auricular and no occipital adenopathy present.       Head  (left side): No tonsillar, no preauricular, no posterior auricular and no occipital adenopathy present.    She has no cervical adenopathy.       Right: No supraclavicular adenopathy present.       Left: No supraclavicular adenopathy present.  Neurological: She is alert and oriented to person, place, and time. No sensory deficit.  Skin: Skin is warm, dry and intact. No rash noted. No cyanosis or erythema. Nails show no clubbing.  Psychiatric: She has a normal mood and affect. Her speech is normal and behavior is normal.       Assessment & Plan:   Problem List Items Addressed This Visit    Chronic lower back pain    Consider duloxetine. She is currently on escitalopram for depression and adjunctive bupropion (for binge eating).  Appointment TODAY at [facility]. She'll ask about changing from escitalopram and buproprion to duloxetine. If they are in agreement, I will prescribe it. If they recommend AGAINST the change, I will refer to another orthopedic specialist for alternate opinion. Would also consider Dr. Niel Hummer as a source for alternate treatment options.      Relevant Medications   cyclobenzaprine (FLEXERIL) 10 MG tablet   Depression    Consider change to duloxetine. Await Medical Weight management opinion, as they prescribe the bupropion.          Return in about 6 weeks (around 02/19/2017) for re-evaluation of mood and pain.   Fara Chute, PA-C [facility]

## 2017-01-08 NOTE — Assessment & Plan Note (Signed)
Consider duloxetine. She is currently on escitalopram for depression and adjunctive bupropion (for binge eating).  Appointment TODAY at [facility]. She'll ask about changing from escitalopram and buproprion to duloxetine. If they are in agreement, I will prescribe it. If they recommend AGAINST the change, I will refer to another orthopedic specialist for alternate opinion. Would also consider Dr. Niel Hummer as a source for alternate treatment options.

## 2017-01-08 NOTE — Assessment & Plan Note (Signed)
Consider change to duloxetine. Await Medical Weight management opinion, as they prescribe the bupropion.

## 2017-01-08 NOTE — Progress Notes (Addendum)
Office: [phone]  /  Fax: [phone]   HPI:   Chief Complaint: OBESITY Sarah Duncan is here to discuss her progress with her obesity treatment plan. She is on the Category 2 plan and is following her eating plan approximately 70 % of the time. She states she is exercising water aerobics for 30 minutes 2 times per week. Sarah Duncan has tapered herself off of Lyrica and has not been on it for the past 2 weeks. She contributes weight loss to being off of Lyrica. Sarah Duncan states hse has been making smarter food choices and controlling her portions. Her weight is 227 lb (103 kg) today and has had a weight loss of 10 pounds over a period of 3 weeks since her last visit. She has gained 3 lbs since starting treatment with [place].  Chronic bilateral Low Back Pain with bilateral sciatica  Sarah Duncan has tapered herself off of Lyrica, due to associated weight gain on Lyrica. She states she has had flare up of her pain which is now all over her body. She saw her PCP today and consideration is to stop Lexapro and Wellbutrin and to put her on Cymbalta for chronic pain and anti depression effects  Depression with emotional eating behaviors  Sarah Duncan is struggling with emotional eating and using food for comfort to the extent that it is negatively impacting her health. She often snacks when she is not hungry. Sarah Duncan sometimes feels she is out of control and then feels guilty that she made poor food choices. She has been working on behavior modification techniques to help reduce her emotional eating and has been somewhat successful. She shows no sign of suicidal or homicidal ideations. Sarah Duncan would like switch to Cymbalta to better control chronic pain and for antidepression effects and would like to taper off the Wellbutrin and Lexapro.  Depression screen [facility] 2/9 01/08/2017 11/24/2016 10/03/2016 08/24/2016 07/18/2016  Decreased Interest 2 0 0 1 0  Down, Depressed, Hopeless 1 0 0 2 0  PHQ - 2 Score 3 0 0 3 0  Altered sleeping 3 - - 0 -   Tired, decreased energy 3 - - 1 -  Change in appetite 1 - - 1 -  Feeling bad or failure about yourself  1 - - 0 -  Trouble concentrating 1 - - 0 -  Moving slowly or fidgety/restless 3 - - 0 -  Suicidal thoughts 0 - - 0 -  PHQ-9 Score 15 - - 5 -  Difficult doing work/chores Somewhat difficult - - - -      ALLERGIES: No Known Allergies  MEDICATIONS: Current Outpatient Prescriptions on File Prior to Visit  Medication Sig Dispense Refill   acetaminophen (TYLENOL) 325 MG tablet Take 2 tablets (650 mg total) by mouth every 6 (six) hours as needed.     BIOTIN PO Take by mouth daily.     buPROPion (WELLBUTRIN SR) 200 MG 12 hr tablet Take 1 tablet (200 mg total) by mouth daily. 30 tablet 0   MELATONIN PO Take by mouth daily.     Multiple Vitamin (MULTIVITAMIN) tablet Take 1 tablet by mouth daily.     Vitamin D, Ergocalciferol, (DRISDOL) 50000 units CAPS capsule Take 1 capsule (50,000 Units total) by mouth every 7 (seven) days. 4 capsule 0   No current facility-administered medications on file prior to visit.     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 Christia Reading   TUBAL LIGATION  05/09/1993    SOCIAL HISTORY: Social History  Substance Use Topics   Smoking status: Former Smoker    Years: 1.00    Types: Cigarettes   Smokeless tobacco: Never Used   Alcohol use 0.6 oz/week    1 Standard drinks or equivalent per week     Comment: social    FAMILY  HISTORY: Family History  Problem Relation Age of Onset   Diabetes Mother    Hypertension Mother    Diabetes Brother    Hypertension Brother    Cancer Father    Endometriosis Daughter    Diabetes Daughter    HIV Son     ROS: Review of Systems  Constitutional: Positive for weight loss.  Musculoskeletal: Negative for back pain (with bilateral sciatica).  Psychiatric/Behavioral: Positive for depression. Negative for suicidal ideas.    PHYSICAL EXAM: Blood pressure 128/79, pulse 97, temperature 98.3 F (36.8 C), temperature source Oral, height 5\' 7"  (1.702 m), weight 227 lb (103 kg), SpO2 100 %. Body mass index is 35.55 kg/m. Physical Exam  Constitutional: She is oriented to person, place, and time. She appears well-developed and well-nourished.  Cardiovascular: Normal rate.   Pulmonary/Chest: Effort normal.  Musculoskeletal: Normal range of motion.  Neurological: She is oriented to person, place, and time.  Skin: Skin is warm and dry.  Psychiatric: She has a normal mood and affect. Her behavior is normal.  Vitals reviewed.   RECENT LABS AND TESTS: BMET    Component Value Date/Time   NA 141 10/03/2016 1025   K 4.4 10/03/2016 1025   CL 104 10/03/2016 1025   CO2 23 10/03/2016 1025   GLUCOSE 92 10/03/2016 1025   GLUCOSE 103 (H) 07/24/2016 1121   BUN 10 10/03/2016 1025   CREATININE 1.00 10/03/2016 1025   CREATININE 1.10 07/24/2016 1121   CALCIUM 9.5 10/03/2016 1025   GFRNONAA 67 10/03/2016 1025   GFRAA 77 10/03/2016 1025   Lab Results  Component Value Date   HGBA1C 6.0 (H) 08/24/2016   Lab Results  Component Value Date   INSULIN 31.7 (H) 08/24/2016   CBC    Component Value Date/Time   WBC 7.5 08/24/2016 1018   WBC 6.1 10/12/2015 1445   RBC 4.77 08/24/2016 1018   RBC 4.85 10/12/2015 1445   HGB 13.5 08/24/2016 1018   HCT 41.2 08/24/2016 1018   PLT 276 08/24/2016 1018   MCV 86 08/24/2016 1018   MCH 28.3 08/24/2016 1018   MCH 28.2 10/12/2015 1445    MCHC 32.8 08/24/2016 1018   MCHC 33.6 10/12/2015 1445   RDW 14.6 08/24/2016 1018   LYMPHSABS 3.5 (H) 08/24/2016 1018   MONOABS 610 10/12/2015 1445   EOSABS 0.2 08/24/2016 1018   BASOSABS 0.0 08/24/2016 1018   Iron/TIBC/Ferritin/ %Sat No results found for: IRON, TIBC, FERRITIN, IRONPCTSAT Lipid Panel     Component Value Date/Time   CHOL 185 08/24/2016 1018   TRIG 127 08/24/2016 1018   HDL 49  08/24/2016 1018   CHOLHDL 3.6 10/12/2015 1445   VLDL 19 10/12/2015 1445   LDLCALC 111 (H) 08/24/2016 1018   Hepatic Function Panel     Component Value Date/Time   PROT 7.6 08/24/2016 1018   ALBUMIN 4.1 08/24/2016 1018   AST 15 08/24/2016 1018   ALT 10 08/24/2016 1018   ALKPHOS 116 08/24/2016 1018   BILITOT <0.2 08/24/2016 1018      Component Value Date/Time   TSH 1.940 08/24/2016 1018   TSH 3.92 07/24/2016 1121   TSH 2.79 10/12/2015 1445    ASSESSMENT AND PLAN: Chronic bilateral low back pain with bilateral sciatica  Other depression - Plan: escitalopram (LEXAPRO) 10 MG tablet  Class 2 obesity with serious comorbidity and body mass index (BMI) of 35.0 to 35.9 in adult, unspecified obesity type  PLAN:  Chronic bilateral Low Back Pain with bilateral sciatica  Sarah Duncan agrees to continue her medications as prescribed and after tapering off other antidepressants, patient will be placed on Cymbalta for chronic pain. She agress to follow up with our clinic in 1 week  Depression with Emotional Eating Behaviors We discussed behavior modification techniques today to help Sarah Duncan deal with her emotional eating and depression.  She has agreed to taper off Lexapro and Wellbutrin over the next 2 weeks and to switch to Cymbalta in 2 weeks. Today, will decrease Lexapro form 20 mg to 10 mg. Patient is advised to stay on Wellbutrin 200 mg until scheduled follow up appointment in one week.    Obesity Sarah Duncan is currently in the action stage of change. As such, her goal is to continue with weight loss  efforts She has agreed to follow the Category 2 plan Kinsley has been instructed to work up to a goal of 150 minutes of combined cardio and strengthening exercise per week for weight loss and overall health benefits. We discussed the following Behavioral Modification Strategies today: meal planning & cooking strategies and increasing lean protein intake  Norvella has agreed to follow up with our clinic in 2 to 3 weeks. She was informed of the importance of frequent follow up visits to maximize her success with intensive lifestyle modifications for her multiple health conditions.  Sarah Duncan, am acting as transcriptionist for Lacy Duverney, [facility]  I have reviewed the above documentation for accuracy and completeness, and I agree with the above. -Sarah Nip, MD   OBESITY BEHAVIORAL INTERVENTION VISIT  Today's visit was # 8 out of 22.  Starting weight: 224 lbs Starting date: 08/24/16 Today's weight : 227 lbs Today's date: 01/09/2017 Total lbs lost to date: 0 (Patients must lose 7 lbs in the first 6 months to continue with counseling)   ASK: We discussed the diagnosis of obesity with Rebecca Eaton today and Jamal agreed to give [place] permission to discuss obesity behavioral modification therapy today.  ASSESS: Jessee has the diagnosis of obesity and her BMI today is 35.55 Brenya is in the action stage of change   ADVISE: Jackelynn was educated on the multiple health risks of obesity as well as the benefit of weight loss to improve her health. She was advised of the need for long term treatment and the importance of lifestyle modifications.  AGREE: Multiple dietary modification options and treatment options were discussed and  Mardee agreed to follow the Category 2 plan We discussed the following Behavioral Modification Strategies today: meal planning & cooking strategies and increasing lean protein intake

## 2017-01-09 MED ORDER — ESCITALOPRAM OXALATE 10 MG PO TABS: 10.0000 mg | ORAL_TABLET | Freq: Every day | ORAL | 0 refills | Status: DC

## 2017-01-10 MED FILL — ESCITALOPRAM 10 MG TABLET: 10 | 14 days supply | Qty: 14 | Fill #0

## 2017-01-16 ENCOUNTER — Ambulatory Visit (INDEPENDENT_AMBULATORY_CARE_PROVIDER_SITE_OTHER): Payer: 59 | Admitting: Family Medicine

## 2017-01-16 VITALS — BP 117/78 | HR 99 | Temp 98.2°F | Ht 67.0 in | Wt 227.0 lb

## 2017-01-16 DIAGNOSIS — R7303 Prediabetes: Secondary | ICD-10-CM | POA: Diagnosis not present

## 2017-01-16 DIAGNOSIS — Z9189 Other specified personal risk factors, not elsewhere classified: Secondary | ICD-10-CM

## 2017-01-16 DIAGNOSIS — E669 Obesity, unspecified: Secondary | ICD-10-CM

## 2017-01-16 DIAGNOSIS — F3289 Other specified depressive episodes: Secondary | ICD-10-CM

## 2017-01-16 DIAGNOSIS — Z6835 Body mass index (BMI) 35.0-35.9, adult: Secondary | ICD-10-CM

## 2017-01-16 DIAGNOSIS — IMO0001 Reserved for inherently not codable concepts without codable children: Secondary | ICD-10-CM

## 2017-01-16 MED ORDER — DULOXETINE HCL 20 MG PO CPEP: 20.0000 mg | ORAL_CAPSULE | Freq: Every day | ORAL | 0 refills | Status: DC

## 2017-01-16 MED FILL — DULoxetine HCL 20 MG CPEP: 20 | 30 days supply | Qty: 30 | Fill #0

## 2017-01-17 NOTE — Progress Notes (Signed)
Office: [phone]  /  Fax: [phone]   HPI:   Chief Complaint: OBESITY Sarah Duncan is here to discuss her progress with her obesity treatment plan. She is on the Category 2 plan and is following her eating plan approximately 75 % of the time. She states she is exercising 0 minutes 0 times per week. Sarah Duncan has done well maintaining weight loss. She is still struggling with emotional eating and is snacking and not eating everything on her category 2 plan. Her weight is 227 lb (103 kg) today and she has maintained weight over a period of 1 week since her last visit. She has gained 3 lbs since starting treatment with [place].  Depression with emotional eating behaviors Sarah Duncan's mood is still depressed. She has been considering starting Cymbalta for mood and for pain, which contributes to her mood. Sarah Duncan struggles with emotional eating and using food for comfort to the extent that it is negatively impacting her health. She often snacks when she is not hungry. Sarah Duncan sometimes feels she is out of control and then feels guilty that she made poor food choices. She has been working on behavior modification techniques to help reduce her emotional eating and has been somewhat successful. She shows no sign of suicidal or homicidal ideations.  Pre-Diabetes Sarah Duncan has a diagnosis of prediabetes based on her elevated HgA1c and was informed this puts her at greater risk of developing diabetes. She is not taking metformin currently and continues to work on diet and exercise to decrease risk of diabetes. She denies nausea or hypoglycemia.  At risk for diabetes Sarah Duncan is at higher than average risk for developing diabetes due to her obesity. She currently denies polyuria or polydipsia.  Depression screen Sarah Duncan 2/9 01/08/2017 11/24/2016 10/03/2016 08/24/2016 07/18/2016  Decreased Interest 2 0 0 1 0  Down, Depressed, Hopeless 1 0 0 2 0  PHQ - 2 Score 3 0 0 3 0  Altered sleeping 3 - - 0 -  Tired, decreased energy 3 - - 1 -    Change in appetite 1 - - 1 -  Feeling bad or failure about yourself  1 - - 0 -  Trouble concentrating 1 - - 0 -  Moving slowly or fidgety/restless 3 - - 0 -  Suicidal thoughts 0 - - 0 -  PHQ-9 Score 15 - - 5 -  Difficult doing work/chores Somewhat difficult - - - -    ALLERGIES: No Known Allergies  MEDICATIONS: Current Outpatient Prescriptions on File Prior to Visit  Medication Sig Dispense Refill   acetaminophen (TYLENOL) 325 MG tablet Take 2 tablets (650 mg total) by mouth every 6 (six) hours as needed.     BIOTIN PO Take by mouth daily.     buPROPion (WELLBUTRIN SR) 200 MG 12 hr tablet Take 1 tablet (200 mg total) by mouth daily. 30 tablet 0   cyclobenzaprine (FLEXERIL) 10 MG tablet TAKE 1/2-1 TABLETS (5-10 MG TOTAL) BY MOUTH 3 TIMES DAILY AS NEEDED FOR MUSCLE SPASMS. 90 tablet 2   MELATONIN PO Take by mouth daily.     Multiple Vitamin (MULTIVITAMIN) tablet Take 1 tablet by mouth daily.     Vitamin D, Ergocalciferol, (DRISDOL) 50000 units CAPS capsule Take 1 capsule (50,000 Units total) by mouth every 7 (seven) days. 4 capsule 0   No current facility-administered medications on file prior to visit.     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 Christia Reading   TUBAL LIGATION  05/09/1993    SOCIAL HISTORY: Social History  Substance Use Topics   Smoking status: Former Smoker    Years: 1.00    Types: Cigarettes   Smokeless tobacco: Never Used   Alcohol use  0.6 oz/week    1 Standard drinks or equivalent per week     Comment: social    FAMILY HISTORY: Family History  Problem Relation Age of Onset   Diabetes Mother    Hypertension Mother    Diabetes Brother    Hypertension Brother    Cancer Father    Endometriosis Daughter    Diabetes Daughter    HIV Son     ROS: Review of Systems  Constitutional: Negative for weight loss.  Genitourinary: Negative for frequency.  Endo/Heme/Allergies: Negative for polydipsia.  Psychiatric/Behavioral: Positive for depression. Negative for suicidal ideas.    PHYSICAL EXAM: Blood pressure 117/78, pulse 99, temperature 98.2 F (36.8 C), temperature source Oral, height 5\' 7"  (1.702 m), weight 227 lb (103 kg), SpO2 99 %. Body mass index is 35.55 kg/m. Physical Exam  Constitutional: She is oriented to person, place, and time. She appears well-developed and well-nourished.  Cardiovascular: Normal rate.   Pulmonary/Chest: Effort normal.  Musculoskeletal: Normal range of motion.  Neurological: She is oriented to person, place, and time.  Skin: Skin is warm and dry.  Vitals reviewed.   RECENT LABS AND TESTS: BMET    Component Value Date/Time   NA 141 10/03/2016 1025   K 4.4 10/03/2016 1025   CL 104 10/03/2016 1025   CO2 23 10/03/2016 1025   GLUCOSE 92 10/03/2016 1025   GLUCOSE 103 (H) 07/24/2016 1121   BUN 10 10/03/2016 1025   CREATININE 1.00 10/03/2016 1025   CREATININE 1.10 07/24/2016 1121   CALCIUM 9.5 10/03/2016 1025   GFRNONAA 67 10/03/2016 1025   GFRAA 77 10/03/2016 1025   Lab Results  Component Value Date   HGBA1C 6.0 (H) 08/24/2016   Lab Results  Component Value Date   INSULIN 31.7 (H) 08/24/2016   CBC    Component Value Date/Time   WBC 7.5 08/24/2016 1018   WBC 6.1 10/12/2015 1445   RBC 4.77 08/24/2016 1018   RBC 4.85 10/12/2015 1445   HGB 13.5 08/24/2016 1018   HCT 41.2 08/24/2016 1018   PLT 276 08/24/2016 1018   MCV 86 08/24/2016 1018   MCH 28.3  08/24/2016 1018   MCH 28.2 10/12/2015 1445   MCHC 32.8 08/24/2016 1018   MCHC 33.6 10/12/2015 1445   RDW 14.6 08/24/2016 1018   LYMPHSABS 3.5 (H) 08/24/2016 1018   MONOABS 610 10/12/2015 1445   EOSABS 0.2 08/24/2016 1018   BASOSABS 0.0 08/24/2016 1018   Iron/TIBC/Ferritin/ %Sat No results found for: IRON, TIBC, FERRITIN, IRONPCTSAT Lipid Panel     Component Value Date/Time   CHOL 185 08/24/2016 1018   TRIG 127 08/24/2016 1018   HDL 49 08/24/2016 1018  CHOLHDL 3.6 10/12/2015 1445   VLDL 19 10/12/2015 1445   LDLCALC 111 (H) 08/24/2016 1018   Hepatic Function Panel     Component Value Date/Time   PROT 7.6 08/24/2016 1018   ALBUMIN 4.1 08/24/2016 1018   AST 15 08/24/2016 1018   ALT 10 08/24/2016 1018   ALKPHOS 116 08/24/2016 1018   BILITOT <0.2 08/24/2016 1018      Component Value Date/Time   TSH 1.940 08/24/2016 1018   TSH 3.92 07/24/2016 1121   TSH 2.79 10/12/2015 1445    ASSESSMENT AND PLAN: Other depression - Plan: DULoxetine (CYMBALTA) 20 MG capsule  At risk for diabetes mellitus  Class 2 obesity with serious comorbidity and body mass index (BMI) of 35.0 to 35.9 in adult, unspecified obesity type  PLAN:  Pre-Diabetes Dafney will continue to work on weight loss, exercise, and decreasing simple carbohydrates in her diet to help decrease the risk of diabetes. We dicussed metformin including benefits and risks. She was informed that eating too many simple carbohydrates or too many calories at one sitting increases the likelihood of GI side effects. Audine declined metformin for now and a prescription was not written today. Amyriah agreed to follow up with [place] as directed to monitor her progress.   Depression with Emotional Eating Behaviors We discussed behavior modification techniques today to help Frankie deal with her emotional eating and depression. She has agreed to discontinue Lexapro and start Cymbalta 20 mg qd #30 with no refills and continue Wellbutrin SR 200 mg qd  and will follow up as directed.  Diabetes risk counselling Tashika was given extended (15 minutes) diabetes prevention counseling today. She is 51 y.o. female and has risk factors for diabetes including obesity. We discussed intensive lifestyle modifications today with an emphasis on weight loss as well as increasing exercise and decreasing simple carbohydrates in her diet.  Obesity Aliviana is currently in the action stage of change. As such, her goal is to continue with weight loss efforts She has agreed to follow the Category 2 plan Zanai has been instructed to work up to a goal of 150 minutes of combined cardio and strengthening exercise per week or water walking for 30 minutes 2 times per week for weight loss and overall health benefits. We discussed the following Behavioral Modification Strategies today: increasing lean protein intake, decreasing simple carbohydrates  and emotional eating strategies  Reilynn has agreed to follow up with our clinic in 1 week. She was informed of the importance of frequent follow up visits to maximize her success with intensive lifestyle modifications for her multiple health conditions.  I, Doreene Nest, am acting as transcriptionist for Dennard Nip, MD  I have reviewed the above documentation for accuracy and completeness, and I agree with the above. -Dennard Nip, MD   OBESITY BEHAVIORAL INTERVENTION VISIT  Today's visit was # 9 out of 22.  Starting weight: 224 lbs Starting date: 08/24/16 Today's weight : 227 lbs Today's date: 01/16/2017 Total lbs lost to date: 0 (Patients must lose 7 lbs in the first 6 months to continue with counseling)   ASK: We discussed the diagnosis of obesity with Rebecca Eaton today and Takiah agreed to give [place] permission to discuss obesity behavioral modification therapy today.  ASSESS: Polina has the diagnosis of obesity and her BMI today is 35.55 Oma is in the action stage of change   ADVISE: Shea was  educated on the multiple health risks of obesity as well as the benefit of weight loss  to improve her health. She was advised of the need for long term treatment and the importance of lifestyle modifications.  AGREE: Multiple dietary modification options and treatment options were discussed and  Laquisha agreed to follow the Category 2 plan We discussed the following Behavioral Modification Strategies today: increasing lean protein intake, decreasing simple carbohydrates  and emotional eating strategies

## 2017-01-23 ENCOUNTER — Ambulatory Visit (INDEPENDENT_AMBULATORY_CARE_PROVIDER_SITE_OTHER): Payer: 59 | Admitting: Physician Assistant

## 2017-01-23 VITALS — BP 138/91 | HR 86 | Temp 98.8°F | Ht 67.0 in | Wt 226.0 lb

## 2017-01-23 DIAGNOSIS — Z6835 Body mass index (BMI) 35.0-35.9, adult: Secondary | ICD-10-CM | POA: Diagnosis not present

## 2017-01-23 DIAGNOSIS — E669 Obesity, unspecified: Secondary | ICD-10-CM

## 2017-01-23 DIAGNOSIS — F3289 Other specified depressive episodes: Secondary | ICD-10-CM | POA: Diagnosis not present

## 2017-01-23 DIAGNOSIS — E559 Vitamin D deficiency, unspecified: Secondary | ICD-10-CM

## 2017-01-23 DIAGNOSIS — Z9189 Other specified personal risk factors, not elsewhere classified: Secondary | ICD-10-CM

## 2017-01-23 MED ORDER — NALTREXONE-BUPROPION HCL ER 8-90 MG PO TB12: 2.0000 | ORAL_TABLET | Freq: Two times a day (BID) | ORAL | 0 refills | Status: DC

## 2017-01-23 MED ORDER — VITAMIN D (ERGOCALCIFEROL) 1.25 MG (50000 UNIT) PO CAPS: 50000.0000 [IU] | ORAL_CAPSULE | ORAL | 0 refills | Status: DC

## 2017-01-23 MED FILL — VIT D2 1.25 MG (50,000 UNIT: 1.25 MG | 28 days supply | Qty: 4 | Fill #0

## 2017-01-23 NOTE — Progress Notes (Signed)
Office: [phone]  /  Fax: [phone]   HPI:   Chief Complaint: OBESITY Sarah Duncan is here to discuss her progress with her obesity treatment plan. She is on the Category 2 plan and is following her eating plan approximately 75 % of the time. She states she is exercising pool exercises 20 to 30 minutes 1 time per week. Ahana continues to do well with weight loss. She states that her hunger is well controlled. Her chronic pain is well controlled after starting cymbalta and she states that following the category 2 plan has been easier after getting her pain under control.  Her weight is 226 lb (102.5 kg) today and has had a weight loss of 1 pound over a period of 1 week since her last visit. She has gained 2 lbs since starting treatment with [place].  Vitamin D deficiency Sarah Duncan has a diagnosis of vitamin D deficiency. She is currently taking vit D and denies nausea, vomiting or muscle weakness.  Depression with emotional eating behaviors Sarah Duncan is struggling with emotional eating and using food for comfort to the extent that it is negatively impacting her health. She often snacks when she is not hungry. Sarah Duncan sometimes feels she is out of control and then feels guilty that she made poor food choices. Her mood is stable and she has been working on behavior modification techniques to help reduce her emotional eating and has been somewhat successful. She shows no sign of suicidal or homicidal ideations.  At risk for osteopenia and osteoporosis Sarah Duncan is at higher risk of osteopenia and osteoporosis due to vitamin D deficiency.    Depression screen [facility] 2/9 01/08/2017 11/24/2016 10/03/2016 08/24/2016 07/18/2016  Decreased Interest 2 0 0 1 0  Down, Depressed, Hopeless 1 0 0 2 0  PHQ - 2 Score 3 0 0 3 0  Altered sleeping 3 - - 0 -  Tired, decreased energy 3 - - 1 -  Change in appetite 1 - - 1 -  Feeling bad or failure about yourself  1 - - 0 -  Trouble concentrating 1 - - 0 -  Moving slowly or  fidgety/restless 3 - - 0 -  Suicidal thoughts 0 - - 0 -  PHQ-9 Score 15 - - 5 -  Difficult doing work/chores Somewhat difficult - - - -             ALLERGIES: No Known Allergies  MEDICATIONS: Current Outpatient Prescriptions on File Prior to Visit  Medication Sig Dispense Refill  . acetaminophen (TYLENOL) 325 MG tablet Take 2 tablets (650 mg total) by mouth every 6 (six) hours as needed.    Marland Kitchen BIOTIN PO Take by mouth daily.    . cyclobenzaprine (FLEXERIL) 10 MG tablet TAKE 1/2-1 TABLETS (5-10 MG TOTAL) BY MOUTH 3 TIMES DAILY AS NEEDED FOR MUSCLE SPASMS. 90 tablet 2  . DULoxetine (CYMBALTA) 20 MG capsule Take 1 capsule (20 mg total) by mouth daily. 30 capsule 0  . MELATONIN PO Take by mouth daily.    . Multiple Vitamin (MULTIVITAMIN) tablet Take 1 tablet by mouth daily.     No current facility-administered medications on file prior to visit.     PAST MEDICAL HISTORY: Past Medical History:  Diagnosis Date  . Anemia   . Anxiety   . Arthritis   . Constipation   . Depression   . Fibroids   . 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 Lactose intolerance   . Palpitations   . Shortness of breath   .  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 ABDOMINAL HYSTERECTOMY  2007  . CESAREAN SECTION  11/19/1989  . 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 SPINE SURGERY  07/13/2015   Dr. Christia Reading  . TUBAL LIGATION  05/09/1993    SOCIAL HISTORY: Social History  Substance Use Topics  . Smoking status: Former Smoker    Years: 1.00    Types: Cigarettes  . Smokeless tobacco: Never Used  . Alcohol use 0.6 oz/week    1 Standard drinks or equivalent per week     Comment: social    FAMILY HISTORY: Family History  Problem Relation Age of Onset  . Diabetes Mother   . Hypertension Mother   . Diabetes Brother   . Hypertension Brother   . Cancer Father   .  Endometriosis Daughter   . Diabetes Daughter   . HIV Son     ROS: Review of Systems  Constitutional: Positive for weight loss.  Gastrointestinal: Negative for nausea and vomiting.  Musculoskeletal:       Negative muscle weakness  Psychiatric/Behavioral: Positive for depression. Negative for suicidal ideas.    PHYSICAL EXAM: Blood pressure (!) 138/91, pulse 86, temperature 98.8 F (37.1 C), temperature source Oral, height 5\' 7"  (1.702 m), weight 226 lb (102.5 kg), SpO2 100 %. Body mass index is 35.4 kg/m. Physical Exam  Constitutional: She is oriented to person, place, and time. She appears well-developed and well-nourished.  Cardiovascular: Normal rate.   Pulmonary/Chest: Effort normal.  Musculoskeletal: Normal range of motion.  Neurological: She is oriented to person, place, and time.  Skin: Skin is warm and dry.  Psychiatric: She has a normal mood and affect. Her behavior is normal.  Vitals reviewed.   RECENT LABS AND TESTS: BMET    Component Value Date/Time   NA 141 10/03/2016 1025   K 4.4 10/03/2016 1025   CL 104 10/03/2016 1025   CO2 23 10/03/2016 1025   GLUCOSE 92 10/03/2016 1025   GLUCOSE 103 (H) 07/24/2016 1121   BUN 10 10/03/2016 1025   CREATININE 1.00 10/03/2016 1025   CREATININE 1.10 07/24/2016 1121   CALCIUM 9.5 10/03/2016 1025   GFRNONAA 67 10/03/2016 1025   GFRAA 77 10/03/2016 1025   Lab Results  Component Value Date   HGBA1C 6.0 (H) 08/24/2016   Lab Results  Component Value Date   INSULIN 31.7 (H) 08/24/2016   CBC    Component Value Date/Time   WBC 7.5 08/24/2016 1018   WBC 6.1 10/12/2015 1445   RBC 4.77 08/24/2016 1018   RBC 4.85 10/12/2015 1445   HGB 13.5 08/24/2016 1018   HCT 41.2 08/24/2016 1018   PLT 276 08/24/2016 1018   MCV 86 08/24/2016 1018   MCH 28.3 08/24/2016 1018   MCH 28.2 10/12/2015 1445   MCHC 32.8 08/24/2016 1018   MCHC 33.6 10/12/2015 1445   RDW 14.6 08/24/2016 1018   LYMPHSABS 3.5 (H) 08/24/2016 1018   MONOABS  610 10/12/2015 1445   EOSABS 0.2 08/24/2016 1018   BASOSABS 0.0 08/24/2016 1018   Iron/TIBC/Ferritin/ %Sat No results found for: IRON, TIBC, FERRITIN, IRONPCTSAT Lipid Panel     Component Value Date/Time   CHOL 185 08/24/2016 1018   TRIG 127 08/24/2016 1018   HDL 49 08/24/2016 1018   CHOLHDL 3.6 10/12/2015 1445   VLDL 19 10/12/2015 1445   LDLCALC 111 (H) 08/24/2016  1018   Hepatic Function Panel     Component Value Date/Time   PROT 7.6 08/24/2016 1018   ALBUMIN 4.1 08/24/2016 1018   AST 15 08/24/2016 1018   ALT 10 08/24/2016 1018   ALKPHOS 116 08/24/2016 1018   BILITOT <0.2 08/24/2016 1018      Component Value Date/Time   TSH 1.940 08/24/2016 1018   TSH 3.92 07/24/2016 1121   TSH 2.79 10/12/2015 1445    ASSESSMENT AND PLAN: Vitamin D deficiency - Plan: Vitamin D, Ergocalciferol, (DRISDOL) 50000 units CAPS capsule  Other depression - Plan: Naltrexone-Bupropion HCl ER (CONTRAVE) 8-90 MG TB12  At risk for osteoporosis  Class 2 obesity without serious comorbidity with body mass index (BMI) of 35.0 to 35.9 in adult, unspecified obesity type - Plan: Naltrexone-Bupropion HCl ER (CONTRAVE) 8-90 MG TB12  PLAN:  Vitamin D Deficiency Sarah Duncan was informed that low vitamin D levels contributes to fatigue and are associated with obesity, breast, and colon cancer. She agrees to continue taking prescription Vit D @50 ,000 IU every week and will follow up for routine testing of vitamin D, at least 2-3 times per year. She was informed of the risk of over-replacement of vitamin D and agrees to not increase her dose unless he discusses this with [place] first. We will refill Vit D for 1 month and Sarah Duncan agrees to follow up with our clinic in 2 weeks.  Depression with Emotional Eating Behaviors We discussed behavior modification techniques today to help Sarah Duncan deal with her emotional eating and depression.We will discontinue Wellbutrin SR 150 mg qd. Sarah Duncan agrees to start taking Contrave 2 pills BID  with no refills and will follow up with our clinic in 2 weeks.   At risk for osteopenia and osteoporosis Sarah Duncan is at risk for osteopenia and osteoporsis due to her vitamin D deficiency. She was encouraged to take her vitamin D and follow her higher calcium diet and increase strengthening exercise to help strengthen her bones and decrease her risk of osteopenia and osteoporosis.  Obesity Sarah Duncan is currently in the action stage of change. As such, her goal is to continue with weight loss efforts She has agreed to follow the Category 2 plan Sarah Duncan has been instructed to work up to a goal of 150 minutes of combined cardio and strengthening exercise per week for weight loss and overall health benefits. We discussed the following Behavioral Modification Strategies today: increasing lean protein intake and work on meal planning and easy cooking plans   Sarah Duncan has agreed to follow up with our clinic in 2 weeks. She was informed of the importance of frequent follow up visits to maximize her success with intensive lifestyle modifications for her multiple health conditions.  I, Trixie Dredge, am acting as transcriptionist for Lacy Duverney, PA-C  I have reviewed the above documentation for accuracy and completeness, and I agree with the above. -Lacy Duverney, PA-C  I have reviewed the above note and agree with the plan. -Dennard Nip, MD   OBESITY BEHAVIORAL INTERVENTION VISIT  Today's visit was # 10 out of 22.  Starting weight: 224 lbs Starting date: 08/24/16 Today's weight: 226 lbs Today's date: 01/23/17 Total lbs lost to date: 0 (Patients must lose 7 lbs in the first 6 months to continue with counseling)   ASK: We discussed the diagnosis of obesity with Sarah Duncan today and Sarah Duncan agreed to give [place] permission to discuss obesity behavioral modification therapy today.  ASSESS: Sarah Duncan has the diagnosis of obesity and her BMI today  is 19 Sarah Duncan is in the action stage of change    ADVISE: Sarah Duncan was educated on the multiple health risks of obesity as well as the benefit of weight loss to improve her health. She was advised of the need for long term treatment and the importance of lifestyle modifications.  AGREE: Multiple dietary modification options and treatment options were discussed and  Sarah Duncan agreed to follow the Category 2 plan We discussed the following Behavioral Modification Strategies today: increasing lean protein intake and work on meal planning and easy cooking plans

## 2017-01-25 ENCOUNTER — Telehealth (INDEPENDENT_AMBULATORY_CARE_PROVIDER_SITE_OTHER): Payer: Self-pay | Admitting: Family Medicine

## 2017-01-25 NOTE — Telephone Encounter (Signed)
[facility] called about Naltrexone-Bupropion HCl ER (CONTRAVE) 8-90 MG TB12 - she will need prior auth. They are faxing the information to our office.

## 2017-01-25 NOTE — Telephone Encounter (Signed)
We have already received fax and started prior auth yesterday for Contrave.   Thanks Korrina Zern

## 2017-01-29 ENCOUNTER — Encounter (INDEPENDENT_AMBULATORY_CARE_PROVIDER_SITE_OTHER): Payer: Self-pay

## 2017-01-30 ENCOUNTER — Encounter (INDEPENDENT_AMBULATORY_CARE_PROVIDER_SITE_OTHER): Payer: Self-pay | Admitting: Physician Assistant

## 2017-02-06 ENCOUNTER — Ambulatory Visit (INDEPENDENT_AMBULATORY_CARE_PROVIDER_SITE_OTHER): Payer: 59 | Admitting: Physician Assistant

## 2017-02-06 VITALS — BP 128/79 | HR 80 | Temp 98.5°F | Ht 67.0 in | Wt 227.0 lb

## 2017-02-06 DIAGNOSIS — Z6835 Body mass index (BMI) 35.0-35.9, adult: Secondary | ICD-10-CM

## 2017-02-06 DIAGNOSIS — E669 Obesity, unspecified: Secondary | ICD-10-CM | POA: Diagnosis not present

## 2017-02-06 DIAGNOSIS — IMO0001 Reserved for inherently not codable concepts without codable children: Secondary | ICD-10-CM

## 2017-02-06 DIAGNOSIS — E559 Vitamin D deficiency, unspecified: Secondary | ICD-10-CM

## 2017-02-06 MED ORDER — LIRAGLUTIDE -WEIGHT MANAGEMENT 18 MG/3ML ~~LOC~~ SOPN: 3.0000 mg | PEN_INJECTOR | Freq: Every day | SUBCUTANEOUS | 0 refills | Status: DC
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
## 2017-02-08 ENCOUNTER — Other Ambulatory Visit (INDEPENDENT_AMBULATORY_CARE_PROVIDER_SITE_OTHER): Payer: Self-pay

## 2017-02-08 ENCOUNTER — Encounter (INDEPENDENT_AMBULATORY_CARE_PROVIDER_SITE_OTHER): Payer: Self-pay | Admitting: Physician Assistant

## 2017-02-08 DIAGNOSIS — F3289 Other specified depressive episodes: Secondary | ICD-10-CM

## 2017-02-08 MED ORDER — DULOXETINE HCL 20 MG PO CPEP: 20.0000 mg | ORAL_CAPSULE | Freq: Every day | ORAL | 0 refills | Status: DC

## 2017-02-19 ENCOUNTER — Telehealth (INDEPENDENT_AMBULATORY_CARE_PROVIDER_SITE_OTHER): Payer: Self-pay | Admitting: Family Medicine

## 2017-02-20 NOTE — Telephone Encounter (Signed)
error 

## 2017-02-22 MED FILL — SAXENDA 18 MG/3 ML PEN: 18 | 30 days supply | Qty: 15 | Fill #0

## 2017-02-26 ENCOUNTER — Other Ambulatory Visit (INDEPENDENT_AMBULATORY_CARE_PROVIDER_SITE_OTHER): Payer: Self-pay | Admitting: Physician Assistant

## 2017-02-26 ENCOUNTER — Encounter (INDEPENDENT_AMBULATORY_CARE_PROVIDER_SITE_OTHER): Payer: Self-pay

## 2017-02-26 DIAGNOSIS — IMO0001 Reserved for inherently not codable concepts without codable children: Secondary | ICD-10-CM

## 2017-02-27 ENCOUNTER — Ambulatory Visit: Payer: 59 | Admitting: Physician Assistant

## 2017-02-28 ENCOUNTER — Encounter: Payer: Self-pay | Admitting: Physician Assistant

## 2017-02-28 ENCOUNTER — Ambulatory Visit (INDEPENDENT_AMBULATORY_CARE_PROVIDER_SITE_OTHER): Payer: 59 | Admitting: Physician Assistant

## 2017-02-28 VITALS — BP 116/80 | HR 91 | Temp 98.3°F | Resp 18 | Ht 67.0 in | Wt 228.0 lb

## 2017-02-28 DIAGNOSIS — M545 Low back pain: Secondary | ICD-10-CM | POA: Diagnosis not present

## 2017-02-28 DIAGNOSIS — G8929 Other chronic pain: Secondary | ICD-10-CM | POA: Diagnosis not present

## 2017-02-28 DIAGNOSIS — F3289 Other specified depressive episodes: Secondary | ICD-10-CM

## 2017-02-28 DIAGNOSIS — Z23 Encounter for immunization: Secondary | ICD-10-CM

## 2017-02-28 MED ORDER — DULOXETINE HCL 30 MG PO CPEP: 30.0000 mg | ORAL_CAPSULE | Freq: Every day | ORAL | 1 refills | Status: DC

## 2017-02-28 MED ORDER — METAXALONE 800 MG PO TABS: 800.0000 mg | ORAL_TABLET | Freq: Three times a day (TID) | ORAL | 1 refills | Status: DC

## 2017-02-28 MED FILL — METAXALONE 800 MG TABLET: 800 | 30 days supply | Qty: 90 | Fill #0

## 2017-02-28 MED FILL — DULoxetine HCL 30 MG CPEP: 30 | 30 days supply | Qty: 30 | Fill #0

## 2017-02-28 NOTE — Assessment & Plan Note (Signed)
PHQ-2 score is 0 today, which is not accurate. INCREASE duloxetine to 30 mg. Consider increasing dose further depending on response.

## 2017-02-28 NOTE — Patient Instructions (Addendum)
INCREASE the duloxetine (Cymbalta) from 20 mg to 30 mg. If that isn't enough, and you continue to tolerate it, we could increase it to 40 mg, then even to 60 mg.  Try the metaxalone (Skelaxin) in place of the cyclobenzaprine (Flexeril).  Try OTC cetirizine (Zyrtec) for the dry cough and teary eye.    IF you received an x-ray today, you will receive an invoice from [facility]. Please contact [facility] at [phone] with questions or concerns regarding your invoice.   IF you received labwork today, you will receive an invoice from [facility]. Please contact [facility] at [phone] with questions or concerns regarding your invoice.   Our billing staff will not be able to assist you with questions regarding bills from these companies.  You will be contacted with the lab results as soon as they are available. The fastest way to get your results is to activate your My Chart account. Instructions are located on the last page of this paperwork. If you have not heard from [place] regarding the results in 2 weeks, please contact this office.

## 2017-02-28 NOTE — Progress Notes (Signed)
Patient ID: Sarah Duncan, female    DOB: [birth year], 51 y.o.   MRN: [id number]  PCP: Harrison Mons, PA-C  Chief Complaint  Patient presents with  . Mood  . Back Pain  . Follow-up    Subjective:   Presents for evaluation of mood and back pain. She is accompanied by her wife, who also has a visit here with me today.  Since her last visit, she's switched from bupropion to duloxetine (for both depression and emotional eating), and finds that it's helping her back pain, but not her mood. Cyclobenzaprine isn't helpful, and makes her groggy. She's still seeing [facility], last visit 02/06/2017, though she has not had success losing weight. She struggles with following her eating plan and has gained 3 lbs since starting with them.  She has recently been under more than her typical stress. Her daughter is 6 months pregnant and just found out that the baby has a heart condition that will require surgery 1-2 days after birth. Her daughter is moving here from [place], and the patient is trying to coordinate the specialists her daughter and grandchild will need here.     Review of Systems Constitutional: Negative for fever.  HENT: Negative for congestion, postnasal drip, rhinorrhea, sinus pain, sinus pressure, sneezing and sore throat.   Respiratory: Positive for cough and shortness of breath.   Cardiovascular: Negative for chest pain.  Gastrointestinal: Negative for abdominal pain, blood in stool, constipation, diarrhea, nausea and vomiting.  Musculoskeletal: Positive for back pain. Negative for gait problem.  Neurological: Positive for headaches. Negative for dizziness, syncope, weakness and light-headedness.   Depression screen [facility] 2/9 02/28/2017 01/08/2017 11/24/2016 10/03/2016 08/24/2016  Decreased Interest 0 2 0 0 1  Down, Depressed, Hopeless 0 1 0 0 2  PHQ - 2 Score 0 3 0 0 3  Altered sleeping - 3 - - 0  Tired, decreased energy - 3 - - 1  Change in  appetite - 1 - - 1  Feeling bad or failure about yourself  - 1 - - 0  Trouble concentrating - 1 - - 0  Moving slowly or fidgety/restless - 3 - - 0  Suicidal thoughts - 0 - - 0  PHQ-9 Score - 15 - - 5  Difficult doing work/chores - Somewhat difficult - - -       Patient Active Problem List   Diagnosis Date Noted  . Class 2 obesity with serious comorbidity and body mass index (BMI) of 35.0 to 35.9 in adult 01/08/2017  . Other chronic pain 12/04/2016  . Prediabetes 10/18/2016  . Vitamin D deficiency 10/18/2016  . Depression 11/23/2015  . 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 Chronic lower back pain 06/18/2015     Prior to Admission medications   Medication Sig Start Date End Date Taking? Authorizing Provider  acetaminophen (TYLENOL) 325 MG tablet Take 2 tablets (650 mg total) by mouth every 6 (six) hours as needed. 12/04/16  Yes Beasley, Caren D, MD  cyclobenzaprine (FLEXERIL) 10 MG tablet TAKE 1/2-1 TABLETS (5-10 MG TOTAL) BY MOUTH 3 TIMES DAILY AS NEEDED FOR MUSCLE SPASMS. 01/08/17  Yes Kenden Brandt, PA-C  DULoxetine (CYMBALTA) 20 MG capsule Take 1 capsule (20 mg total) by mouth daily. 02/08/17  Yes Osman, Sahar M, PA-C  MELATONIN PO Take by mouth daily.   Yes [provider]  Vitamin D, Ergocalciferol, (DRISDOL) 50000 units CAPS capsule Take 1 capsule (50,000 Units total) by mouth every 7 (seven) days. 01/23/17  Yes Alene Mires, Sahar M, PA-C  BIOTIN PO Take by mouth daily.    [provider]  Liraglutide -Weight Management (SAXENDA) 18 MG/3ML SOPN Inject 3 mg into the skin daily. Patient not taking: Reported on 02/28/2017 02/06/17   Waldon Merl, PA-C  Multiple Vitamin (MULTIVITAMIN) tablet Take 1 tablet by mouth daily.    [provider]  Naltrexone-Bupropion HCl ER (CONTRAVE) 8-90 MG TB12 Take 2 tablets by mouth 2 (two) times daily. Patient not taking: Reported on 02/28/2017 01/23/17   Waldon Merl, PA-C     No Known Allergies     Objective:  Physical Exam    Constitutional: She is oriented to person, place, and time. She appears well-developed and well-nourished. She is active and cooperative. No distress.  BP 116/80 (BP Location: Left Arm, Patient Position: Sitting, Cuff Size: Large)   Pulse 91   Temp 98.3 F (36.8 C) (Oral)   Resp 18   Ht 5\' 7"  (1.702 m)   Wt 228 lb (103.4 kg)   SpO2 97%   BMI 35.71 kg/m   HENT:  Head: Normocephalic and atraumatic.  Right Ear: Hearing normal.  Left Ear: Hearing normal.  Eyes: Conjunctivae are normal. No scleral icterus.  Neck: Normal range of motion. Neck supple. No thyromegaly present.  Cardiovascular: Normal rate, regular rhythm and normal heart sounds.   Pulses:      Radial pulses are 2+ on the right side, and 2+ on the left side.  Pulmonary/Chest: Effort normal and breath sounds normal.  Lymphadenopathy:       Head (right side): No tonsillar, no preauricular, no posterior auricular and no occipital adenopathy present.       Head (left side): No tonsillar, no preauricular, no posterior auricular and no occipital adenopathy present.    She has no cervical adenopathy.       Right: No supraclavicular adenopathy present.       Left: No supraclavicular adenopathy present.  Neurological: She is alert and oriented to person, place, and time. No sensory deficit.  Skin: Skin is warm, dry and intact. No rash noted. No cyanosis or erythema. Nails show no clubbing.  Psychiatric: She has a normal mood and affect. Her speech is normal and behavior is normal.      Assessment & Plan:   Problem List Items Addressed This Visit    Chronic lower back pain - Primary    Some improved with change to duloxetine. INCREASE to 30 mg daily. Consider additional increase depending on response. Also, change cyclobenzaprine to [facility], as she isn't finding benefit from the cyclobenzaprine.      Relevant Medications   metaxalone (SKELAXIN) 800 MG tablet   Depression    PHQ-2 score is 0 today, which is not  accurate. INCREASE duloxetine to 30 mg. Consider increasing dose further depending on response.       Relevant Medications   DULoxetine (CYMBALTA) 30 MG capsule    Other Visit Diagnoses    Flu vaccine need       Relevant Orders   Flu Vaccine QUAD 36+ mos IM (Completed)       Return in about 6 weeks (around 04/11/2017) for re-evalaution of mood.   Fara Chute, PA-C [facility]

## 2017-02-28 NOTE — Assessment & Plan Note (Addendum)
Some improved with change to duloxetine. INCREASE to 30 mg daily. Consider additional increase depending on response. Also, change cyclobenzaprine to [facility], as she isn't finding benefit from the cyclobenzaprine.

## 2017-02-28 NOTE — Progress Notes (Signed)
Subjective:    Patient ID: Sarah Duncan, female    DOB: 12-Oct-1965, 51 y.o.   MRN: [id number]  HPI  Chief Complaint  Patient presents with  . Mood  . Back Pain  . Follow-up   Patient returns today for a 7 week follow up of mood and chronic low back pain. She discontinued Lyrica, escitalopram and buproprion and switched to duloxetine, which she continues to take and reports it "is working okay". She notes it is not changing her mood much but it helps with her back pain slightly. She reports the Flexeril does not help at all, "feel like my body is immune to it." She has been managing her symptoms by just dealing with it. Patient notes increased heart palpitations recently, she thinks it could be attributed to the increased stressors of life. She had to reschedule her appointment with [place] from yesterday to drive up to [place] for a "family emergency", which she did not elaborate on.  She reports having a dry coughing spell over the last couple of months, that will wake her up at night. She is not a smoker.  She also notes a teary RIGHT eye that began a few months ago, which continues to water consistently. Last eye exam was January 2018. She has not tried taking any OTC allergy medications.   Dr. Leafy Ro tried to start her on Naltrexone-Bupropion, but it was too expensive for her to fill the prescription, so they are going to try Liraglutide injections for weight management instead. She states the injections were just approved by her insurance, and she has not received them yet. She returns to see them in a couple of weeks.   She is going to see Dr. Lorin Mercy in November for her knee pain.   Review of Systems  Constitutional: Negative for fever.  HENT: Negative for congestion, postnasal drip, rhinorrhea, sinus pain, sinus pressure, sneezing and sore throat.   Respiratory: Positive for cough and shortness of breath.   Cardiovascular: Negative for chest pain.  Gastrointestinal: Negative  for abdominal pain, blood in stool, constipation, diarrhea, nausea and vomiting.  Musculoskeletal: Positive for back pain. Negative for gait problem.  Neurological: Positive for headaches. Negative for dizziness, syncope, weakness and light-headedness.   Patient Active Problem List   Diagnosis Date Noted  . Class 2 obesity with serious comorbidity and body mass index (BMI) of 35.0 to 35.9 in adult 01/08/2017  . Other chronic pain 12/04/2016  . Class 2 obesity without serious comorbidity with body mass index (BMI) of 36.0 to 36.9 in adult 12/04/2016  . Class 1 obesity without serious comorbidity with body mass index (BMI) of 34.0 to 34.9 in adult 10/18/2016  . Prediabetes 10/18/2016  . Vitamin D deficiency 10/18/2016  . BMI 34.0-34.9,adult 11/25/2015  . Depression 11/23/2015  . 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 Chronic lower back pain 06/18/2015   Prior to Admission medications   Medication Sig Start Date End Date Taking? Authorizing Provider  acetaminophen (TYLENOL) 325 MG tablet Take 2 tablets (650 mg total) by mouth every 6 (six) hours as needed. 12/04/16  Yes Beasley, Caren D, MD  cyclobenzaprine (FLEXERIL) 10 MG tablet TAKE 1/2-1 TABLETS (5-10 MG TOTAL) BY MOUTH 3 TIMES DAILY AS NEEDED FOR MUSCLE SPASMS. 01/08/17  Yes Jeffery, Chelle, PA-C  DULoxetine (CYMBALTA) 20 MG capsule Take 1 capsule (20 mg total) by mouth daily. 02/08/17  Yes Osman, Sahar M, PA-C  MELATONIN PO Take by mouth daily.   Yes [provider]  Vitamin  D, Ergocalciferol, (DRISDOL) 50000 units CAPS capsule Take 1 capsule (50,000 Units total) by mouth every 7 (seven) days. 01/23/17  Yes Osman, Sahar M, PA-C  BIOTIN PO Take by mouth daily.    [provider]  Liraglutide -Weight Management (SAXENDA) 18 MG/3ML SOPN Inject 3 mg into the skin daily. Patient not taking: Reported on 02/28/2017 02/06/17   Waldon Merl, PA-C  Multiple Vitamin (MULTIVITAMIN) tablet Take 1 tablet by mouth daily.    [provider]   Naltrexone-Bupropion HCl ER (CONTRAVE) 8-90 MG TB12 Take 2 tablets by mouth 2 (two) times daily. Patient not taking: Reported on 02/28/2017 01/23/17   Waldon Merl, PA-C   No Known Allergies Social History   Social History  . Marital status: Married    Spouse name: Ronni Rumble  . Number of children: 4  . Years of education: associates   Occupational History  . 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 Smoking status: Former Smoker    Years: 1.00    Types: Cigarettes  . Smokeless tobacco: Never Used  . Alcohol use 0.6 oz/week    1 Standard drinks or equivalent per week     Comment: social  . Drug use: No  . Sexual activity: Yes    Partners: Female   Other Topics Concern  . Not on file   Social History Narrative   Divorced. 4 adult children (3 in [place], one in [place]).   Re-Married 05/2015. Lives with her wife.   Her wife's children live locally.      Objective:   Physical Exam  Constitutional: She is oriented to person, place, and time. She appears well-developed and well-nourished. No distress.  HENT:  Head: Normocephalic and atraumatic.  Right Ear: External ear normal.  Left Ear: External ear normal.  Neck: Neck supple. No JVD present. No tracheal deviation present. No thyromegaly present.  Cardiovascular: Normal rate, regular rhythm, normal heart sounds and intact distal pulses.  Exam reveals no gallop and no friction rub.   No murmur heard. Pulmonary/Chest: Effort normal and breath sounds normal. No stridor. No respiratory distress. She has no wheezes. She has no rales. She exhibits no tenderness.  Lymphadenopathy:    She has no cervical adenopathy.  Neurological: She is alert and oriented to person, place, and time.  Skin: Skin is warm and dry. No rash noted. She is not diaphoretic. No erythema. No pallor.  Psychiatric: She has a normal mood and affect. Her behavior is normal. Judgment and thought content normal.       Assessment & Plan:  1. Chronic low back pain, unspecified back pain laterality, with sciatica presence unspecified - Try Skelaxin for pain relief.  - metaxalone (SKELAXIN) 800 MG tablet; Take 1 tablet (800 mg total) by mouth 3 (three) times daily.  Dispense: 90 tablet; Refill: 1  2. Prediabetes - Continue with healthier diet choices and exercise as tolerated. Continue to follow with [facility].   3. Other depression - Increased dose of Cymbalta from 20mg  to 30mg .  - DULoxetine (CYMBALTA) 30 MG capsule; Take 1 capsule (30 mg total) by mouth daily.  Dispense: 30 capsule; Refill: 1  4. Flu vaccine need - Completed in clinic today - Flu Vaccine QUAD 36+ mos IM  Follow up in 6 weeks to re-evaluate efficacy of increased dose of Cymbalta on mood, sooner if needed. Instructed patient to try Certirizine for dry  cough and teary eye.   Respectfully, Denny Levy PA-S 2019

## 2017-03-06 ENCOUNTER — Ambulatory Visit (INDEPENDENT_AMBULATORY_CARE_PROVIDER_SITE_OTHER): Payer: 59 | Admitting: Physician Assistant

## 2017-03-06 VITALS — BP 102/69 | HR 80 | Temp 97.8°F | Ht 67.0 in | Wt 220.0 lb

## 2017-03-06 DIAGNOSIS — E559 Vitamin D deficiency, unspecified: Secondary | ICD-10-CM

## 2017-03-06 DIAGNOSIS — E669 Obesity, unspecified: Secondary | ICD-10-CM

## 2017-03-06 DIAGNOSIS — Z9189 Other specified personal risk factors, not elsewhere classified: Secondary | ICD-10-CM

## 2017-03-06 DIAGNOSIS — R7303 Prediabetes: Secondary | ICD-10-CM | POA: Diagnosis not present

## 2017-03-06 DIAGNOSIS — E7849 Other hyperlipidemia: Secondary | ICD-10-CM

## 2017-03-06 DIAGNOSIS — Z6834 Body mass index (BMI) 34.0-34.9, adult: Secondary | ICD-10-CM | POA: Diagnosis not present

## 2017-03-06 NOTE — Progress Notes (Signed)
Office: [phone]  /  Fax: [phone]   HPI:   Chief Complaint: OBESITY Sarah Duncan is here to discuss her progress with her obesity treatment plan. She is on the  follow the Category 2 plan and is following her eating plan approximately 50 % of the time. She states she is exercising 0 minutes 0 times per week. [name] continues to do well with weight loss. She found it hard to stay on track while on vacation but managed to get back on track when she got back home. She is motivated to continue with the planning of her meals and continue with weight loss. Her weight is 220 lb (99.8 kg) today and has had a weight loss of 7 pounds over a period of 4 weeks since her last visit. She has lost 4 lbs since starting treatment with [place].  Vitamin D deficiency Sarah Duncan has a diagnosis of vitamin D deficiency. She is currently taking vit D and denies nausea, vomiting or muscle weakness.  Hyperlipidemia Sarah Duncan has hyperlipidemia and has been trying to improve her cholesterol levels with intensive lifestyle modification including a low saturated fat diet, exercise and weight loss. She is not on a statin and she declines today. She denies any chest pain, claudication or myalgias.  Pre-Diabetes Sarah Duncan has a diagnosis of pre-diabetes based on her elevated Hgb A1c and was informed this puts her at greater risk of developing diabetes. She is not taking metformin currently and continues to work on diet and exercise to decrease risk of diabetes. She denies nausea or hypoglycemia.  At risk for diabetes Sarah Duncan is at higher than average risk for developing diabetes due to her obesity. She currently denies polyuria or polydipsia.  ALLERGIES: No Known Allergies  MEDICATIONS: Current Outpatient Prescriptions on File Prior to Visit  Medication Sig Dispense Refill  . acetaminophen (TYLENOL) 325 MG tablet Take 2 tablets (650 mg total) by mouth every 6 (six) hours as needed.    Marland Kitchen BIOTIN PO Take by mouth daily.    .  DULoxetine (CYMBALTA) 30 MG capsule Take 1 capsule (30 mg total) by mouth daily. 30 capsule 1  . Liraglutide -Weight Management (SAXENDA) 18 MG/3ML SOPN Inject 3 mg into the skin daily. (Patient not taking: Reported on 02/28/2017) 5 pen 0  . MELATONIN PO Take by mouth daily.    . metaxalone (SKELAXIN) 800 MG tablet Take 1 tablet (800 mg total) by mouth 3 (three) times daily. 90 tablet 1  . Multiple Vitamin (MULTIVITAMIN) tablet Take 1 tablet by mouth daily.    . Vitamin D, Ergocalciferol, (DRISDOL) 50000 units CAPS capsule Take 1 capsule (50,000 Units total) by mouth every 7 (seven) days. 4 capsule 0   No current facility-administered medications on file prior to visit.     PAST MEDICAL HISTORY: Past Medical History:  Diagnosis Date  . Anemia   . Anxiety   . Arthritis   . Constipation   . Depression   . Fibroids   . 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 Lactose intolerance   . Palpitations   . Shortness of breath   . 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 ABDOMINAL HYSTERECTOMY  2007  . CESAREAN SECTION  11/19/1989  . 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 SPINE SURGERY  07/13/2015   Lumbar - Dr. Christia Reading  . TUBAL LIGATION  05/09/1993  SOCIAL HISTORY: Social History  Substance Use Topics  . Smoking status: Former Smoker    Years: 1.00    Types: Cigarettes  . Smokeless tobacco: Never Used  . Alcohol use 0.6 oz/week    1 Standard drinks or equivalent per week     Comment: social    FAMILY HISTORY: Family History  Problem Relation Age of Onset  . Diabetes Mother   . Hypertension Mother   . Diabetes Brother   . Hypertension Brother   . Cancer Father   . Endometriosis Daughter   . Diabetes Daughter   . HIV Son     ROS: Review of Systems  Constitutional: Positive for weight loss.  Cardiovascular: Negative for chest pain and  claudication.  Gastrointestinal: Negative for nausea and vomiting.  Genitourinary: Negative for frequency.  Musculoskeletal: Negative for myalgias.       Negative muscle weakness  Endo/Heme/Allergies: Negative for polydipsia.       Negative hypoglycemia    PHYSICAL EXAM: Blood pressure 102/69, pulse 80, temperature 97.8 F (36.6 C), height 5\' 7"  (1.702 m), weight 220 lb (99.8 kg), SpO2 100 %. Body mass index is 34.46 kg/m. Physical Exam  Constitutional: She is oriented to person, place, and time. She appears well-developed and well-nourished.  Cardiovascular: Normal rate.   Pulmonary/Chest: Effort normal.  Musculoskeletal: Normal range of motion.  Neurological: She is oriented to person, place, and time.  Skin: Skin is warm and dry.  Psychiatric: She has a normal mood and affect. Her behavior is normal.  Vitals reviewed.   RECENT LABS AND TESTS: BMET    Component Value Date/Time   NA 141 10/03/2016 1025   K 4.4 10/03/2016 1025   CL 104 10/03/2016 1025   CO2 23 10/03/2016 1025   GLUCOSE 92 10/03/2016 1025   GLUCOSE 103 (H) 07/24/2016 1121   BUN 10 10/03/2016 1025   CREATININE 1.00 10/03/2016 1025   CREATININE 1.10 07/24/2016 1121   CALCIUM 9.5 10/03/2016 1025   GFRNONAA 67 10/03/2016 1025   GFRAA 77 10/03/2016 1025   Lab Results  Component Value Date   HGBA1C 6.0 (H) 08/24/2016   Lab Results  Component Value Date   INSULIN 31.7 (H) 08/24/2016   CBC    Component Value Date/Time   WBC 7.5 08/24/2016 1018   WBC 6.1 10/12/2015 1445   RBC 4.77 08/24/2016 1018   RBC 4.85 10/12/2015 1445   HGB 13.5 08/24/2016 1018   HCT 41.2 08/24/2016 1018   PLT 276 08/24/2016 1018   MCV 86 08/24/2016 1018   MCH 28.3 08/24/2016 1018   MCH 28.2 10/12/2015 1445   MCHC 32.8 08/24/2016 1018   MCHC 33.6 10/12/2015 1445   RDW 14.6 08/24/2016 1018   LYMPHSABS 3.5 (H) 08/24/2016 1018   MONOABS 610 10/12/2015 1445   EOSABS 0.2 08/24/2016 1018   BASOSABS 0.0 08/24/2016 1018    Iron/TIBC/Ferritin/ %Sat No results found for: IRON, TIBC, FERRITIN, IRONPCTSAT Lipid Panel     Component Value Date/Time   CHOL 185 08/24/2016 1018   TRIG 127 08/24/2016 1018   HDL 49 08/24/2016 1018   CHOLHDL 3.6 10/12/2015 1445   VLDL 19 10/12/2015 1445   LDLCALC 111 (H) 08/24/2016 1018   Hepatic Function Panel     Component Value Date/Time   PROT 7.6 08/24/2016 1018   ALBUMIN 4.1 08/24/2016 1018   AST 15 08/24/2016 1018   ALT 10 08/24/2016 1018   ALKPHOS 116 08/24/2016 1018   BILITOT <0.2 08/24/2016 1018  Component Value Date/Time   TSH 1.940 08/24/2016 1018   TSH 3.92 07/24/2016 1121   TSH 2.79 10/12/2015 1445    ASSESSMENT AND PLAN: Vitamin D deficiency - Plan: VITAMIN D 25 Hydroxy (Vit-D Deficiency, Fractures)  Other hyperlipidemia - Plan: Lipid Panel With LDL/HDL Ratio  Prediabetes - Plan: Comprehensive metabolic panel, Hemoglobin A1c, Insulin, random  At risk for diabetes mellitus  Class 1 obesity with serious comorbidity and body mass index (BMI) of 34.0 to 34.9 in adult, unspecified obesity type  PLAN:  Vitamin D Deficiency Aleene was informed that low vitamin D levels contributes to fatigue and are associated with obesity, breast, and colon cancer. She agrees to continue to take prescription Vit D @50 ,000 IU every week. We will check labs and will follow up for routine testing of vitamin D, at least 2-3 times per year. She was informed of the risk of over-replacement of vitamin D and agrees to not increase her dose unless he discusses this with [place] first.  Hyperlipidemia Veleka was informed of the American Heart Association Guidelines emphasizing intensive lifestyle modifications as the first line treatment for hyperlipidemia. We discussed many lifestyle modifications today in depth, and Sabryn will continue to work on decreasing saturated fats such as fatty red meat, butter and many fried foods. She will also increase vegetables and lean protein in her  diet and continue to work on exercise and weight loss efforts. We will check labs and Carleena agrees to follow up with our clinic in 2 weeks.  Pre-Diabetes Jalissa will continue to work on weight loss, exercise, and decreasing simple carbohydrates in her diet to help decrease the risk of diabetes. We dicussed metformin including benefits and risks. She was informed that eating too many simple carbohydrates or too many calories at one sitting increases the likelihood of GI side effects. Estrella declined metformin for now and a prescription was not written today. We will check labs and Zema agreed to follow up with [place] as directed to monitor her progress.  Diabetes risk counseling Sonal was given extended (15 minutes) diabetes prevention counseling today. She is 51 y.o. female and has risk factors for diabetes including obesity and pre-diabetes. We discussed intensive lifestyle modifications today with an emphasis on weight loss as well as increasing exercise and decreasing simple carbohydrates in her diet.   Obesity Tika is currently in the action stage of change. As such, her goal is to continue with weight loss efforts She has agreed to follow the Category 2 plan Janalynn has been instructed to work up to a goal of 150 minutes of combined cardio and strengthening exercise per week for weight loss and overall health benefits. We discussed the following Behavioral Modification Strategies today: increasing lean protein intake and work on meal planning and easy cooking plans  Shannell has agreed to follow up with our clinic in 2 weeks. She was informed of the importance of frequent follow up visits to maximize her success with intensive lifestyle modifications for her multiple health conditions.  I, Doreene Nest, am acting as transcriptionist for Lacy Duverney, PA-C  I have reviewed the above documentation for accuracy and completeness, and I agree with the above. -Lacy Duverney, PA-C  I have reviewed the above  note and agree with the plan. -Dennard Nip, MD   OBESITY BEHAVIORAL INTERVENTION VISIT  Today's visit was # 4 out of 22.  Starting weight: 224 lbs Starting date: 08/24/16 Today's weight : 220 lbs  Today's date: 03/06/2017 Total lbs lost to date:  4 (Patients must lose 7 lbs in the first 6 months to continue with counseling)   ASK: We discussed the diagnosis of obesity with Rebecca Eaton today and Braley agreed to give [place] permission to discuss obesity behavioral modification therapy today.  ASSESS: Pranika has the diagnosis of obesity and her BMI today is 34.45 Jamel is in the action stage of change   ADVISE: Ailsa was educated on the multiple health risks of obesity as well as the benefit of weight loss to improve her health. She was advised of the need for long term treatment and the importance of lifestyle modifications.  AGREE: Multiple dietary modification options and treatment options were discussed and  Graycie agreed to follow the Category 2 plan We discussed the following Behavioral Modification Strategies today: increasing lean protein intake and work on meal planning and easy cooking plans

## 2017-03-07 LAB — COMPREHENSIVE METABOLIC PANEL
ALK PHOS: 117 IU/L (ref 39–117)
ALT: 7 IU/L (ref 0–32)
AST: 13 IU/L (ref 0–40)
Albumin/Globulin Ratio: 1.3 (ref 1.2–2.2)
Albumin: 4.3 g/dL (ref 3.5–5.5)
BUN/Creatinine Ratio: 8 — ABNORMAL LOW (ref 9–23)
BUN: 9 mg/dL (ref 6–24)
Bilirubin Total: 0.4 mg/dL (ref 0.0–1.2)
CO2: 25 mmol/L (ref 20–29)
CREATININE: 1.14 mg/dL — AB (ref 0.57–1.00)
Calcium: 9.8 mg/dL (ref 8.7–10.2)
Chloride: 100 mmol/L (ref 96–106)
GFR calc Af Amer: 66 mL/min/{1.73_m2} (ref >59)
GFR calc non Af Amer: 57 mL/min/{1.73_m2} — ABNORMAL LOW (ref >59)
GLUCOSE: 83 mg/dL (ref 65–99)
Globulin, Total: 3.2 g/dL (ref 1.5–4.5)
Potassium: 4.1 mmol/L (ref 3.5–5.2)
SODIUM: 140 mmol/L (ref 134–144)
Total Protein: 7.5 g/dL (ref 6.0–8.5)

## 2017-03-07 LAB — HEMOGLOBIN A1C
ESTIMATED AVERAGE GLUCOSE: 128 mg/dL
HEMOGLOBIN A1C: 6.1 % — AB (ref 4.8–5.6)

## 2017-03-07 LAB — LIPID PANEL WITH LDL/HDL RATIO
CHOLESTEROL TOTAL: 176 mg/dL (ref 100–199)
HDL: 48 mg/dL (ref >39)
LDL Calculated: 111 mg/dL — ABNORMAL HIGH (ref 0–99)
LDl/HDL Ratio: 2.3 ratio (ref 0.0–3.2)
TRIGLYCERIDES: 86 mg/dL (ref 0–149)
VLDL Cholesterol Cal: 17 mg/dL (ref 5–40)

## 2017-03-07 LAB — INSULIN, RANDOM: INSULIN: 27.3 u[IU]/mL — ABNORMAL HIGH (ref 2.6–24.9)

## 2017-03-07 LAB — VITAMIN D 25 HYDROXY (VIT D DEFICIENCY, FRACTURES): Vit D, 25-Hydroxy: 35 ng/mL (ref 30.0–100.0)

## 2017-03-20 ENCOUNTER — Encounter (INDEPENDENT_AMBULATORY_CARE_PROVIDER_SITE_OTHER): Payer: Self-pay

## 2017-03-20 ENCOUNTER — Ambulatory Visit (INDEPENDENT_AMBULATORY_CARE_PROVIDER_SITE_OTHER): Payer: 59 | Admitting: Physician Assistant

## 2017-03-27 ENCOUNTER — Ambulatory Visit (INDEPENDENT_AMBULATORY_CARE_PROVIDER_SITE_OTHER): Payer: 59

## 2017-03-27 ENCOUNTER — Ambulatory Visit (INDEPENDENT_AMBULATORY_CARE_PROVIDER_SITE_OTHER): Payer: 59 | Admitting: Orthopaedic Surgery

## 2017-03-27 ENCOUNTER — Encounter (INDEPENDENT_AMBULATORY_CARE_PROVIDER_SITE_OTHER): Payer: Self-pay | Admitting: Orthopaedic Surgery

## 2017-03-27 VITALS — BP 133/87 | HR 74 | Ht 67.0 in | Wt 220.0 lb

## 2017-03-27 DIAGNOSIS — M25562 Pain in left knee: Secondary | ICD-10-CM | POA: Diagnosis not present

## 2017-03-27 DIAGNOSIS — M25561 Pain in right knee: Secondary | ICD-10-CM

## 2017-03-27 DIAGNOSIS — M545 Low back pain: Secondary | ICD-10-CM | POA: Diagnosis not present

## 2017-03-27 DIAGNOSIS — G8929 Other chronic pain: Secondary | ICD-10-CM
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
## 2017-04-16 HISTORY — PX: TOOTH EXTRACTION: SUR596

## 2017-04-17 ENCOUNTER — Other Ambulatory Visit: Payer: Self-pay

## 2017-04-17 ENCOUNTER — Ambulatory Visit (INDEPENDENT_AMBULATORY_CARE_PROVIDER_SITE_OTHER): Payer: 59 | Admitting: Physician Assistant

## 2017-04-17 ENCOUNTER — Encounter: Payer: Self-pay | Admitting: Physician Assistant

## 2017-04-17 VITALS — BP 108/72 | HR 85 | Temp 98.5°F | Resp 18 | Ht 67.0 in | Wt 220.6 lb

## 2017-04-17 DIAGNOSIS — F3289 Other specified depressive episodes: Secondary | ICD-10-CM

## 2017-04-17 DIAGNOSIS — M545 Low back pain: Secondary | ICD-10-CM

## 2017-04-17 DIAGNOSIS — G8929 Other chronic pain: Secondary | ICD-10-CM | POA: Diagnosis not present

## 2017-04-17 DIAGNOSIS — R112 Nausea with vomiting, unspecified: Secondary | ICD-10-CM

## 2017-04-17 MED ORDER — PROMETHAZINE HCL 12.5 MG PO TABS: 12.5000 mg | ORAL_TABLET | Freq: Three times a day (TID) | ORAL | 1 refills | Status: DC | PRN

## 2017-04-17 MED FILL — PROMETHAZINE 12.5 MG TABLET: 12.5 | 5 days supply | Qty: 30 | Fill #0

## 2017-04-17 NOTE — Progress Notes (Signed)
Patient ID: Sarah Duncan, female    DOB: [birth year], 51 y.o.   MRN: [id number]  PCP: Harrison Mons, PA-C  Chief Complaint  Patient presents with  . Mood    Pt states the the increased dosage of Cymbalta made her nausous and would like to decrease her doasge and her mood is the same. Depression scale score 14.  Sarah Duncan    pt brings paperwork for Disablilty Parking Placard    Subjective:   Presents for evaluation of mood.  In addition, she needs an updated handicap parking placard. She is unable to walk >200 feet without stopping to rest due to her chronic back pain.. Nausea with duloxetine dose increase. Dr. Lorin Mercy recommended trying the duloxetine at [facility]. She tolerates it overnight, but experiences nausea when she gets up the the morning. Did not experience nausea when she started it at the lower dose. Has stopped the skelaxin, because she thought it was also causing the nausea, though she always took it with the duloxetine. Is using duloxetine "PRN"  Experiencing constipation.  Had a tooth pulled. On Amoxicillin and hydrocodone temporarily.  Started liraglutide for weight loss (with [facility]), but followed the wrong directions on the first dose and took too much, causing severe nausea and vomiting.  Lumbar spine MRI update planned for 06/2016.   Review of Systems  Constitutional: Negative.   HENT: Negative for sore throat.   Eyes: Negative for visual disturbance.  Respiratory: Negative for cough, chest tightness, shortness of breath and wheezing.   Cardiovascular: Negative for chest pain and palpitations.  Gastrointestinal: Positive for constipation and nausea. Negative for abdominal pain, diarrhea and vomiting.  Genitourinary: Negative for dysuria, frequency, hematuria and urgency.  Musculoskeletal: Positive for back pain. Negative for arthralgias and myalgias.  Skin: Negative for rash.  Neurological: Negative for dizziness, weakness and headaches.    Psychiatric/Behavioral: Negative for decreased concentration. The patient is not nervous/anxious.        Patient Active Problem List   Diagnosis Date Noted  . Other hyperlipidemia 03/06/2017  . Class 2 obesity with serious comorbidity and body mass index (BMI) of 35.0 to 35.9 in adult 01/08/2017  . Other chronic pain 12/04/2016  . Prediabetes 10/18/2016  . Vitamin D deficiency 10/18/2016  . Depression 11/23/2015  . 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 Chronic lower back pain 06/18/2015     Prior to Admission medications   Medication Sig Start Date End Date Taking? Authorizing Provider  acetaminophen (TYLENOL) 325 MG tablet Take 2 tablets (650 mg total) by mouth every 6 (six) hours as needed. 12/04/16  Yes Dennard Nip D, MD  BIOTIN PO Take by mouth daily.   Yes [provider]  DULoxetine (CYMBALTA) 30 MG capsule Take 1 capsule (30 mg total) by mouth daily. 02/28/17  Yes Michale Weikel, PA-C  Liraglutide -Weight Management (SAXENDA) 18 MG/3ML SOPN Inject 3 mg into the skin daily. 02/06/17  Yes Osman, Sahar M, PA-C  MELATONIN PO Take by mouth daily.   Yes [provider]  metaxalone (SKELAXIN) 800 MG tablet Take 1 tablet (800 mg total) by mouth 3 (three) times daily. 02/28/17  Yes Richetta Cubillos, PA-C  Multiple Vitamin (MULTIVITAMIN) tablet Take 1 tablet by mouth daily.   Yes [provider]  Vitamin D, Ergocalciferol, (DRISDOL) 50000 units CAPS capsule Take 1 capsule (50,000 Units total) by mouth every 7 (seven) days. 01/23/17  Yes Lacy Duverney M, PA-C     No Known Allergies     Objective:  Physical Exam  Constitutional: She is oriented to person, place, and time. She appears well-developed and well-nourished. She is active and cooperative. No distress.  BP 108/72 (BP Location: Left Arm, Patient Position: Sitting, Cuff Size: Large)   Pulse 85   Temp 98.5 F (36.9 C) (Oral)   Resp 18   Ht 5\' 7"  (1.702 m)   Wt 220 lb 9.6 oz (100.1 kg)   SpO2 100%   BMI  34.55 kg/m   HENT:  Head: Normocephalic and atraumatic.  Right Ear: Hearing normal.  Left Ear: Hearing normal.  Eyes: Conjunctivae are normal. No scleral icterus.  Neck: Normal range of motion. Neck supple. No thyromegaly present.  Cardiovascular: Normal rate, regular rhythm and normal heart sounds.  Pulses:      Radial pulses are 2+ on the right side, and 2+ on the left side.  Pulmonary/Chest: Effort normal and breath sounds normal.  Lymphadenopathy:       Head (right side): No tonsillar, no preauricular, no posterior auricular and no occipital adenopathy present.       Head (left side): No tonsillar, no preauricular, no posterior auricular and no occipital adenopathy present.    She has no cervical adenopathy.       Right: No supraclavicular adenopathy present.       Left: No supraclavicular adenopathy present.  Neurological: She is alert and oriented to person, place, and time. No sensory deficit.  Skin: Skin is warm, dry and intact. No rash noted. No cyanosis or erythema. Nails show no clubbing.  Psychiatric: She has a normal mood and affect. Her speech is normal and behavior is normal.     Wt Readings from Last 3 Encounters:  04/17/17 220 lb 9.6 oz (100.1 kg)  03/27/17 220 lb (99.8 kg)  03/06/17 220 lb (99.8 kg)      Assessment & Plan:   Problem List Items Addressed This Visit    Chronic lower back pain - Primary    Proceed per orthopedics. Resume duloxetine daily x 2 weeks. If nausea persists, will either reduce dose or change agents. Resume Skelaxin, as it is unlikely that was causing nausea.      Depression    Resume DAILY duloxetine. If nausea persists in 2 weeks, will reduce to previously tolerated lower dose or change agents.       Other Visit Diagnoses    Nausea and vomiting, intractability of vomiting not specified, unspecified vomiting type       Advised PRN use of duloxetine is likely contributing. Take daily. If persists in 2 weeks, will reduce dose or  change agents.   Relevant Medications   promethazine (PHENERGAN) 12.5 MG tablet       Return in about 6 weeks (around 05/29/2017) for re-evaluation of mood, nausea, etc.   Fara Chute, PA-C [facility] [place]

## 2017-04-17 NOTE — Patient Instructions (Addendum)
1. Continue the Cymbalta 30 mg, take it at bedtime. 2. ADD the promethazine at bedtime to help reduce nausea. If you are still experiencing nausea in 2 weeks, let me know. 3. Add Miralax daily to help constipation. 4. Restart the Skelaxin once you are doing ok on the duloxetine.    IF you received an x-ray today, you will receive an invoice from [facility]. Please contact [facility] at [phone] with questions or concerns regarding your invoice.   IF you received labwork today, you will receive an invoice from [place]. Please contact [facility] at [phone] with questions or concerns regarding your invoice.   Our billing staff will not be able to assist you with questions regarding bills from these companies.  You will be contacted with the lab results as soon as they are available. The fastest way to get your results is to activate your My Chart account. Instructions are located on the last page of this paperwork. If you have not heard from [place] regarding the results in 2 weeks, please contact this office.

## 2017-04-18 MED FILL — DULoxetine HCL 30 MG CPEP: 30 | 30 days supply | Qty: 30 | Fill #1

## 2017-04-22 NOTE — Assessment & Plan Note (Addendum)
Proceed per orthopedics. Resume duloxetine daily x 2 weeks. If nausea persists, will either reduce dose or change agents. Resume Skelaxin, as it is unlikely that was causing nausea.

## 2017-04-22 NOTE — Assessment & Plan Note (Signed)
Resume DAILY duloxetine. If nausea persists in 2 weeks, will reduce to previously tolerated lower dose or change agents.

## 2017-05-29 ENCOUNTER — Ambulatory Visit: Payer: 59 | Admitting: Physician Assistant

## 2017-06-05 ENCOUNTER — Encounter: Payer: Self-pay | Admitting: Physician Assistant

## 2017-06-05 ENCOUNTER — Other Ambulatory Visit: Payer: Self-pay

## 2017-06-05 ENCOUNTER — Ambulatory Visit (INDEPENDENT_AMBULATORY_CARE_PROVIDER_SITE_OTHER): Payer: 59 | Admitting: Physician Assistant

## 2017-06-05 VITALS — BP 118/72 | HR 94 | Temp 99.3°F | Resp 16 | Ht 67.72 in | Wt 219.0 lb

## 2017-06-05 DIAGNOSIS — E7849 Other hyperlipidemia: Secondary | ICD-10-CM

## 2017-06-05 DIAGNOSIS — F3289 Other specified depressive episodes: Secondary | ICD-10-CM

## 2017-06-05 DIAGNOSIS — G8929 Other chronic pain: Secondary | ICD-10-CM

## 2017-06-05 DIAGNOSIS — R7303 Prediabetes: Secondary | ICD-10-CM | POA: Diagnosis not present

## 2017-06-05 DIAGNOSIS — M545 Low back pain: Secondary | ICD-10-CM

## 2017-06-05 MED ORDER — DULOXETINE HCL 30 MG PO CPEP: 30.0000 mg | ORAL_CAPSULE | Freq: Every day | ORAL | 3 refills | Status: DC

## 2017-06-05 MED ORDER — TIZANIDINE HCL 4 MG PO TABS: 2.0000 mg | ORAL_TABLET | Freq: Four times a day (QID) | ORAL | 0 refills | Status: DC | PRN

## 2017-06-05 NOTE — Patient Instructions (Addendum)
CONTINUE the Cymbalta (duloxetine) at BEDTIME. STOP the Skelaxin. TRY the Zanaflex (tizanidine). Use the Phenergan AS NEEDED for nausea.    IF you received an x-ray today, you will receive an invoice from [facility]. Please contact [facility] at [phone] with questions or concerns regarding your invoice.   IF you received labwork today, you will receive an invoice from [place]. Please contact [facility] at [phone] with questions or concerns regarding your invoice.   Our billing staff will not be able to assist you with questions regarding bills from these companies.  You will be contacted with the lab results as soon as they are available. The fastest way to get your results is to activate your My Chart account. Instructions are located on the last page of this paperwork. If you have not heard from [place] regarding the results in 2 weeks, please contact this office.

## 2017-06-05 NOTE — Assessment & Plan Note (Signed)
Update labs today 

## 2017-06-05 NOTE — Assessment & Plan Note (Signed)
Stable. Continue current treatment. Consider increase duloxetine dose.
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
## 2017-06-05 NOTE — Assessment & Plan Note (Signed)
Proceed with follow-up per orthopedics. Try change from [facility] to tizanidine.

## 2017-06-05 NOTE — Progress Notes (Deleted)
   Subjective:    Patient ID: Sarah Duncan, female    DOB: 1966-02-27, 52 y.o.   MRN: [id number]  HPI    Review of Systems     Objective:   Physical Exam        Assessment & Plan:

## 2017-06-06 LAB — COMPREHENSIVE METABOLIC PANEL
ALT: 6 IU/L (ref 0–32)
AST: 12 IU/L (ref 0–40)
Albumin/Globulin Ratio: 1.2 (ref 1.2–2.2)
Albumin: 3.8 g/dL (ref 3.5–5.5)
Alkaline Phosphatase: 109 IU/L (ref 39–117)
BUN/Creatinine Ratio: 10 (ref 9–23)
BUN: 10 mg/dL (ref 6–24)
Bilirubin Total: 0.3 mg/dL (ref 0.0–1.2)
CALCIUM: 9.6 mg/dL (ref 8.7–10.2)
CO2: 21 mmol/L (ref 20–29)
CREATININE: 0.99 mg/dL (ref 0.57–1.00)
Chloride: 104 mmol/L (ref 96–106)
GFR, EST AFRICAN AMERICAN: 77 mL/min/{1.73_m2} (ref >59)
GFR, EST NON AFRICAN AMERICAN: 67 mL/min/{1.73_m2} (ref >59)
Globulin, Total: 3.1 g/dL (ref 1.5–4.5)
Glucose: 114 mg/dL — ABNORMAL HIGH (ref 65–99)
Potassium: 4.2 mmol/L (ref 3.5–5.2)
Sodium: 140 mmol/L (ref 134–144)
TOTAL PROTEIN: 6.9 g/dL (ref 6.0–8.5)

## 2017-06-06 LAB — LIPID PANEL
CHOLESTEROL TOTAL: 160 mg/dL (ref 100–199)
Chol/HDL Ratio: 3.9 ratio (ref 0.0–4.4)
HDL: 41 mg/dL (ref >39)
LDL CALC: 96 mg/dL (ref 0–99)
TRIGLYCERIDES: 115 mg/dL (ref 0–149)
VLDL CHOLESTEROL CAL: 23 mg/dL (ref 5–40)

## 2017-06-06 LAB — HEMOGLOBIN A1C
Est. average glucose Bld gHb Est-mCnc: 128 mg/dL
Hgb A1c MFr Bld: 6.1 % — ABNORMAL HIGH (ref 4.8–5.6)

## 2017-06-22 MED FILL — tiZANidine HCL 4 MG TABS: 4 | 30 days supply | Qty: 90 | Fill #0

## 2017-06-22 MED FILL — PROMETHAZINE 12.5 MG TABLET: 12.5 | 5 days supply | Qty: 30 | Fill #1

## 2017-06-26 ENCOUNTER — Encounter (INDEPENDENT_AMBULATORY_CARE_PROVIDER_SITE_OTHER): Payer: Self-pay | Admitting: Orthopaedic Surgery

## 2017-06-26 ENCOUNTER — Ambulatory Visit (INDEPENDENT_AMBULATORY_CARE_PROVIDER_SITE_OTHER): Payer: 59 | Admitting: Orthopaedic Surgery

## 2017-06-26 VITALS — BP 124/85 | HR 67 | Ht 68.0 in | Wt 219.0 lb

## 2017-06-26 DIAGNOSIS — G8929 Other chronic pain: Secondary | ICD-10-CM

## 2017-06-26 DIAGNOSIS — M545 Low back pain: Secondary | ICD-10-CM | POA: Diagnosis not present

## 2017-06-27 ENCOUNTER — Ambulatory Visit (INDEPENDENT_AMBULATORY_CARE_PROVIDER_SITE_OTHER): Payer: 59 | Admitting: Orthopaedic Surgery

## 2017-07-08 ENCOUNTER — Ambulatory Visit
Admission: RE | Admit: 2017-07-08 | Discharge: 2017-07-08 | Disposition: A | Discharge status: Home / Self Care | Payer: 59 | Source: Ambulatory Visit | Attending: Orthopaedic Surgery | Admitting: Orthopaedic Surgery

## 2017-07-08 DIAGNOSIS — G8929 Other chronic pain: Secondary | ICD-10-CM

## 2017-07-08 DIAGNOSIS — M48061 Spinal stenosis, lumbar region without neurogenic claudication: Secondary | ICD-10-CM | POA: Diagnosis not present

## 2017-07-08 DIAGNOSIS — M545 Low back pain: Principal | ICD-10-CM

## 2017-07-16 ENCOUNTER — Ambulatory Visit (INDEPENDENT_AMBULATORY_CARE_PROVIDER_SITE_OTHER): Payer: 59 | Admitting: Physician Assistant

## 2017-07-16 VITALS — BP 125/78 | HR 74 | Temp 98.2°F | Ht 67.0 in | Wt 212.0 lb

## 2017-07-16 DIAGNOSIS — E669 Obesity, unspecified: Secondary | ICD-10-CM | POA: Diagnosis not present

## 2017-07-16 DIAGNOSIS — Z6833 Body mass index (BMI) 33.0-33.9, adult: Secondary | ICD-10-CM | POA: Diagnosis not present

## 2017-07-16 DIAGNOSIS — Z9189 Other specified personal risk factors, not elsewhere classified: Secondary | ICD-10-CM | POA: Diagnosis not present

## 2017-07-16 DIAGNOSIS — F331 Major depressive disorder, recurrent, moderate: Secondary | ICD-10-CM

## 2017-07-16 DIAGNOSIS — R7303 Prediabetes: Secondary | ICD-10-CM

## 2017-07-16 MED ORDER — DULOXETINE HCL 30 MG PO CPEP: 30.0000 mg | ORAL_CAPSULE | Freq: Every day | ORAL | 0 refills | Status: DC

## 2017-07-17 NOTE — Progress Notes (Signed)
Office: [phone]  /  Fax: [phone]   HPI:   Chief Complaint: OBESITY [name] is here to discuss her progress with her obesity treatment plan. She is on the Category 2 plan and is following her eating plan approximately 0 % of the time. She states she is exercising 0 minutes 0 times per week. [name] has not been in for a follow up visit since 03/06/17 and states she was out of town attending to her sick family. [name] states she has had an exacerbation of her chronic pain, and has had an increase in emotional eating. Her weight is 212 lb (96.2 kg) today and has had a weight loss of 8 pounds over a period of 19 weeks since her last visit. She has lost 12 lbs since starting treatment with [place].  Pre-Diabetes [name] has a diagnosis of pre-diabetes based on her elevated Hgb A1c and was informed this puts her at greater risk of developing diabetes. She is not taking metformin currently and continues to work on diet and exercise to decrease risk of diabetes. She denies nausea, polyphagia or hypoglycemia.  At risk for diabetes [name] is at higher than average risk for developing diabetes due to her obesity and pre-diabetes. She currently denies polyuria or polydipsia.  Major Depressive Disorder [name] is struggling with emotional eating and using food for comfort to the extent that it is negatively impacting her health. She often snacks when she is not hungry. [name] sometimes feels she is out of control and then feels guilty that she made poor food choices. She has been working on behavior modification techniques to help reduce her emotional eating and has been somewhat successful. Her mood is stable and she shows no sign of suicidal or homicidal ideations.  Depression screen [name] 2/9 06/05/2017 04/17/2017 02/28/2017 01/08/2017 11/24/2016  Decreased Interest 1 2 0 2 0  Down, Depressed, Hopeless 1 2 0 1 0  PHQ - 2 Score 2 4 0 3 0  Altered sleeping 2 1 - 3 -  Tired, decreased energy 1 2 - 3 -  Change in  appetite 1 2 - 1 -  Feeling bad or failure about yourself  1 2 - 1 -  Trouble concentrating 1 2 - 1 -  Moving slowly or fidgety/restless 2 1 - 3 -  Suicidal thoughts 0 0 - 0 -  PHQ-9 Score 10 14 - 15 -  Difficult doing work/chores Somewhat difficult Somewhat difficult - Somewhat difficult -  Some recent data might be hidden      ALLERGIES: No Known Allergies  MEDICATIONS: Current Outpatient Medications on File Prior to Visit  Medication Sig Dispense Refill  . Liraglutide -Weight Management (SAXENDA) 18 MG/3ML SOPN Inject 3 mg into the skin daily. 5 pen 0  . MELATONIN PO Take by mouth daily.    . promethazine (PHENERGAN) 12.5 MG tablet Take 1-2 tablets (12.5-25 mg total) by mouth every 8 (eight) hours as needed for nausea or vomiting. 30 tablet 1  . tiZANidine (ZANAFLEX) 4 MG tablet Take 0.5-1 tablets (2-4 mg total) by mouth every 6 (six) hours as needed for muscle spasms. Not more than 3 times daily. 90 tablet 0   No current facility-administered medications on file prior to visit.     PAST MEDICAL HISTORY: Past Medical History:  Diagnosis Date  . Anemia   . Anxiety   . Arthritis   . Constipation   . Depression   . Fibroids   . 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  Lactose intolerance   . Palpitations   . Shortness of breath   . 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 ABDOMINAL HYSTERECTOMY  2007  . CESAREAN SECTION  11/19/1989  . 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 SPINE SURGERY  07/13/2015   Lumbar - Dr. Christia Reading  . TOOTH EXTRACTION  04/16/2017  . TUBAL LIGATION  05/09/1993    SOCIAL HISTORY: Social History   Tobacco Use  . Smoking status: Former Smoker    Years: 1.00    Types: Cigarettes  . Smokeless tobacco: Never Used  Substance Use Topics  . Alcohol use: Yes    Alcohol/week: 0.6 oz    Types: 1 Standard drinks or  equivalent per week    Comment: social  . Drug use: No    FAMILY HISTORY: Family History  Problem Relation Age of Onset  . Diabetes Mother   . Hypertension Mother   . Diabetes Brother   . Hypertension Brother   . Cancer Father   . Endometriosis Daughter   . Diabetes Daughter   . HIV Son     ROS: Review of Systems  Constitutional: Positive for weight loss.  Gastrointestinal: Negative for nausea.  Genitourinary: Negative for frequency.  Endo/Heme/Allergies: Negative for polydipsia.       Negative for polyphagia Negative for hypoglycemia  Psychiatric/Behavioral: Positive for depression. Negative for suicidal ideas.    PHYSICAL EXAM: Blood pressure 125/78, pulse 74, temperature 98.2 F (36.8 C), temperature source Oral, height 5\' 7"  (1.702 m), weight 212 lb (96.2 kg), SpO2 99 %. Body mass index is 33.2 kg/m. Physical Exam  Constitutional: She is oriented to person, place, and time. She appears well-developed and well-nourished.  Cardiovascular: Normal rate.  Pulmonary/Chest: Effort normal.  Musculoskeletal: Normal range of motion.  Neurological: She is oriented to person, place, and time.  Skin: Skin is warm and dry.  Psychiatric: She has a normal mood and affect. Her behavior is normal.  Vitals reviewed.   RECENT LABS AND TESTS: BMET    Component Value Date/Time   NA 140 06/05/2017 1500   K 4.2 06/05/2017 1500   CL 104 06/05/2017 1500   CO2 21 06/05/2017 1500   GLUCOSE 114 (H) 06/05/2017 1500   GLUCOSE 103 (H) 07/24/2016 1121   BUN 10 06/05/2017 1500   CREATININE 0.99 06/05/2017 1500   CREATININE 1.10 07/24/2016 1121   CALCIUM 9.6 06/05/2017 1500   GFRNONAA 67 06/05/2017 1500   GFRAA 77 06/05/2017 1500   Lab Results  Component Value Date   HGBA1C 6.1 (H) 06/05/2017   HGBA1C 6.1 (H) 03/06/2017   HGBA1C 6.0 (H) 08/24/2016   Lab Results  Component Value Date   INSULIN 27.3 (H) 03/06/2017   INSULIN 31.7 (H) 08/24/2016   CBC    Component Value  Date/Time   WBC 7.5 08/24/2016 1018   WBC 6.1 10/12/2015 1445   RBC 4.77 08/24/2016 1018   RBC 4.85 10/12/2015 1445   HGB 13.5 08/24/2016 1018   HCT 41.2 08/24/2016 1018   PLT 276 08/24/2016 1018   MCV 86 08/24/2016 1018   MCH 28.3 08/24/2016 1018   MCH 28.2 10/12/2015 1445   MCHC 32.8 08/24/2016 1018   MCHC 33.6 10/12/2015 1445   RDW 14.6 08/24/2016 1018   LYMPHSABS 3.5 (H) 08/24/2016 1018   MONOABS 610 10/12/2015 1445   EOSABS 0.2 08/24/2016 1018  BASOSABS 0.0 08/24/2016 1018   Iron/TIBC/Ferritin/ %Sat No results found for: IRON, TIBC, FERRITIN, IRONPCTSAT Lipid Panel     Component Value Date/Time   CHOL 160 06/05/2017 1500   TRIG 115 06/05/2017 1500   HDL 41 06/05/2017 1500   CHOLHDL 3.9 06/05/2017 1500   CHOLHDL 3.6 10/12/2015 1445   VLDL 19 10/12/2015 1445   LDLCALC 96 06/05/2017 1500   Hepatic Function Panel     Component Value Date/Time   PROT 6.9 06/05/2017 1500   ALBUMIN 3.8 06/05/2017 1500   AST 12 06/05/2017 1500   ALT 6 06/05/2017 1500   ALKPHOS 109 06/05/2017 1500   BILITOT 0.3 06/05/2017 1500      Component Value Date/Time   TSH 1.940 08/24/2016 1018   TSH 3.92 07/24/2016 1121   TSH 2.79 10/12/2015 1445    ASSESSMENT AND PLAN: Prediabetes  Moderate episode of recurrent major depressive disorder ([place])  At risk for diabetes mellitus  Class 1 obesity with serious comorbidity and body mass index (BMI) of 33.0 to 33.9 in adult, unspecified obesity type  PLAN:  Pre-Diabetes Lizanne will continue to work on weight loss, exercise, and decreasing simple carbohydrates in her diet to help decrease the risk of diabetes. She was informed that eating too many simple carbohydrates or too many calories at one sitting increases the likelihood of GI side effects. Keyleigh agreed to follow up with [place] as directed to monitor her progress.  Diabetes risk counseling Malene was given extended (15 minutes) diabetes prevention counseling today. She is 52 y.o. female and  has risk factors for diabetes including obesity and pre-diabetes. We discussed intensive lifestyle modifications today with an emphasis on weight loss as well as increasing exercise and decreasing simple carbohydrates in her diet.  Major Depressive Disorder We discussed behavior modification techniques today to help Catherina deal with her emotional eating and depression. She has agreed to continue Cymbalta 30 mg qd #30 with no refills and follow up as directed.  Obesity Spring is currently in the action stage of change. As such, her goal is to continue with weight loss efforts She has agreed to follow the Category 2 plan  Ashlan has been instructed to work up to a goal of 150 minutes of combined cardio and strengthening exercise per week for weight loss and overall health benefits. We discussed the following Behavioral Modification Strategies today: increasing lean protein intake and work on meal planning and easy cooking plans  Lemma has agreed to follow up with our clinic in 2 weeks. She was informed of the importance of frequent follow up visits to maximize her success with intensive lifestyle modifications for her multiple health conditions.    OBESITY BEHAVIORAL INTERVENTION VISIT  Today's visit was # 19 out of 22.  Starting weight: 224 lbs Starting date: 08/24/16 Today's weight : 212 lbs Today's date: 07/16/2017 Total lbs lost to date: 12 (Patients must lose 7 lbs in the first 6 months to continue with counseling)   ASK: We discussed the diagnosis of obesity with Rebecca Eaton today and Maryanne agreed to give [place] permission to discuss obesity behavioral modification therapy today.  ASSESS: Kaeley has the diagnosis of obesity and her BMI today is 33.2 Morine is in the action stage of change   ADVISE: Idalie was educated on the multiple health risks of obesity as well as the benefit of weight loss to improve her health. She was advised of the need for long term treatment and the  importance of lifestyle  modifications.  AGREE: Multiple dietary modification options and treatment options were discussed and  Cena agreed to the above obesity treatment plan.   Corey Skains, am acting as transcriptionist for [employer], PA-C I, Lacy Duverney [facility], have reviewed this note and agree with its content.

## 2017-07-23 MED FILL — DULoxetine HCL 30 MG CPEP: 30 | 30 days supply | Qty: 30 | Fill #0

## 2017-07-24 ENCOUNTER — Encounter (INDEPENDENT_AMBULATORY_CARE_PROVIDER_SITE_OTHER): Payer: Self-pay | Admitting: Orthopaedic Surgery

## 2017-07-24 ENCOUNTER — Ambulatory Visit (INDEPENDENT_AMBULATORY_CARE_PROVIDER_SITE_OTHER): Payer: 59 | Admitting: Orthopaedic Surgery

## 2017-07-24 VITALS — BP 126/84 | HR 71 | Ht 61.0 in | Wt 210.0 lb

## 2017-07-24 DIAGNOSIS — M545 Low back pain: Secondary | ICD-10-CM | POA: Diagnosis not present

## 2017-07-24 DIAGNOSIS — G8929 Other chronic pain: Secondary | ICD-10-CM

## 2017-07-24 NOTE — Progress Notes (Addendum)
Office Visit Note   [name]           Date of Birth: 01/11/1966           MRN: [id number] Visit Date: 07/24/2017              Requested by: Harrison Mons, PA-C [address] [place], [place] [address] PCP: Harrison Mons, PA-C   Assessment & Plan: Visit Diagnoses:  1. Chronic low back pain, unspecified back pain laterality, with sciatica presence unspecified     Plan: The patient is working on weight loss which did improve her symptoms.  She has disc space narrowing at L4-5 with moderate left facet hypertrophy and moderate left foraminal stenosis with slight worsening of right lateral recess stenosis.  Previous microdiscectomy at L4-5.  No disc recurrence is noted.  She can continue to work on exercise program continue to work on weight loss .  Patient asked about lumbar spine fusion we discussed problems with loading adjacent levels and the potential for degeneration at adjacent levels.  MRI images were reviewed I gave her a copy of the report.  Question of previous findings on MRI scan as well as review of plain radiographs that she has had in the past were reviewed as well.  Follow-Up Instructions: Return if symptoms worsen or fail to improve.   Orders:  No orders of the defined types were placed in this encounter.  No orders of the defined types were placed in this encounter.     Procedures: No procedures performed   Clinical Data: No additional findings.   Subjective: Chief Complaint  Patient presents with  . Follow-up    MRI review    HPI 52 year old female returns for follow-up of ongoing problems with low back pain.  Previous surgery done in [place] in November 2017 at the L4-5 level.  Weight loss states she lost 20 pounds.  We have her at 10 pounds less from 03/27/2017 office visit.  Due to ongoing problems with her back and MRI scan was obtained and is available for review.  Previous scan showed disc space narrowing at L4-5 but this  previous site of her surgery with some moderate foraminal stenosis.  Patient has been on Cymbalta also Skelaxin.  She is unable take anti-inflammatories she only has one kidney.  Her primary care note family stressors that she is having to deal with.  She has back pain with activity.  Pain radiates into her buttocks right and left.  Review of Systems positive for low back pain, obesity, previous microdiscectomy L4-5, depression.   Objective: Vital Signs: BP 126/84   Pulse 71   Ht 5\' 1"  (1.549 m)   Wt 210 lb (95.3 kg)   BMI 39.68 kg/m   Physical Exam  Constitutional: She is oriented to person, place, and time. She appears well-developed.  HENT:  Head: Normocephalic.  Right Ear: External ear normal.  Left Ear: External ear normal.  Eyes: EOM are normal. Pupils are equal, round, and reactive to light.  Neck: No tracheal deviation present. No thyromegaly present.  Cardiovascular: Normal rate.  Pulmonary/Chest: Effort normal.  Abdominal: There is no guarding.  Neurological: She is alert and oriented to person, place, and time.  Skin: Skin is warm and dry.  Psychiatric: She has a normal mood and affect. Her behavior is normal.    Ortho Exam no rash over exposed skin.  Knees reach full extension.  Normal heel toe gait. Specialty Comments:  No specialty comments available.  Imaging:CLINICAL DATA:  Chronic back pain  EXAM: MRI LUMBAR SPINE WITHOUT CONTRAST  TECHNIQUE: Multiplanar, multisequence MR imaging of the lumbar spine was performed. No intravenous contrast was administered.  COMPARISON:  Lumbar spine MRI 04/30/2016  FINDINGS: Segmentation:  Standard.  Alignment:  Physiologic.  Vertebrae:  No fracture, evidence of discitis, or bone lesion.  Conus medullaris and cauda equina: Conus extends to the T12 level. Conus and cauda equina appear normal.  Paraspinal and other soft tissues: The visualized vascular, retroperitoneal and paraspinal structures are  normal.  Disc levels:  The disc spaces from T10 to L3 are normal.  L3-L4: Small right subarticular disc protrusion and mild facet hypertrophy. Mild narrowing of the right lateral recess, unchanged. No spinal canal stenosis or neural foraminal stenosis.  L4-L5: Disc space narrowing severe right and moderate left facet hypertrophy, unchanged. No spinal canal stenosis. Narrowing of the right lateral recess is worsened. Moderate left neural foraminal stenosis is unchanged.  L5-S1: No disc herniation or stenosis.  IMPRESSION: 1. 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 2. Mild right L3-4 lateral recess narrowing.   Electronically Signed   By: Ulyses Jarred M.D.   On: 07/09/2017 04:23     PMFS History: Patient Active Problem List   Diagnosis Date Noted  . Other hyperlipidemia 03/06/2017  . Class 2 obesity with serious comorbidity and body mass index (BMI) of 35.0 to 35.9 in adult 01/08/2017  . Other chronic pain 12/04/2016  . Prediabetes 10/18/2016  . Vitamin D deficiency 10/18/2016  . Depression 11/23/2015  . 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 Chronic lower back pain 06/18/2015   Past Medical History:  Diagnosis Date  . Anemia   . Anxiety   . Arthritis   . Constipation   . Depression   . Fibroids   . 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 Lactose intolerance   . Palpitations   . Shortness of breath   . 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 Diabetes Mother   . Hypertension Mother   . Diabetes Brother   . Hypertension Brother   . Cancer Father   . Endometriosis Daughter   . Diabetes Daughter   . HIV Son     Past Surgical History:  Procedure Laterality Date  . ABDOMINAL HYSTERECTOMY  2007  . CESAREAN SECTION  11/19/1989  . 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 SPINE SURGERY  07/13/2015    Lumbar - Dr. Christia Reading  . TOOTH EXTRACTION  04/16/2017  . TUBAL LIGATION  05/09/1993   Social History   Occupational History  . 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 Smoking status: Former Smoker    Years: 1.00    Types: Cigarettes  . Smokeless tobacco: Never Used  Substance and Sexual Activity  . Alcohol use: Yes    Alcohol/week: 0.6 oz    Types: 1 Standard drinks or equivalent per week    Comment: social  . Drug use: No  . Sexual activity: Yes    Partners: Female

## 2017-07-31 ENCOUNTER — Ambulatory Visit (INDEPENDENT_AMBULATORY_CARE_PROVIDER_SITE_OTHER): Payer: 59 | Admitting: Physician Assistant

## 2017-07-31 VITALS — BP 120/75 | HR 63 | Temp 97.9°F | Ht 67.0 in | Wt 211.0 lb

## 2017-07-31 DIAGNOSIS — E669 Obesity, unspecified: Secondary | ICD-10-CM | POA: Diagnosis not present

## 2017-07-31 DIAGNOSIS — Z6833 Body mass index (BMI) 33.0-33.9, adult: Secondary | ICD-10-CM

## 2017-07-31 DIAGNOSIS — I10 Essential (primary) hypertension: Secondary | ICD-10-CM | POA: Insufficient documentation

## 2017-07-31 DIAGNOSIS — R7303 Prediabetes: Secondary | ICD-10-CM | POA: Diagnosis not present

## 2017-07-31 NOTE — Progress Notes (Signed)
Office: [phone]  /  Fax: [phone]   HPI:   Chief Complaint: OBESITY Sarah Duncan is here to discuss her progress with her obesity treatment plan. She is on the Category 2 plan and is following her eating plan approximately 50 % of the time. She states she is walking for 30 minutes 2-3 times per week. Sarah Duncan continues to do well with weight loss. She continues to have challenges with her snack choices.  Her weight is 211 lb (95.7 kg) today and has had a weight loss of 1 pound over a period of 2 weeks since her last visit. She has lost 13 lbs since starting treatment with [place].  Pre-Diabetes Sarah Duncan has a diagnosis of pre-diabetes based on her elevated Hgb A1c and was informed this puts her at greater risk of developing diabetes. She is not on metformin and declines and she continues to work on diet and exercise to decrease risk of diabetes. She denies polyphagia, nausea, or hypoglycemia.  ALLERGIES: No Known Allergies  MEDICATIONS: Current Outpatient Medications on File Prior to Visit  Medication Sig Dispense Refill  . DULoxetine (CYMBALTA) 30 MG capsule Take 1 capsule (30 mg total) by mouth daily. 30 capsule 0  . Liraglutide -Weight Management (SAXENDA) 18 MG/3ML SOPN Inject 3 mg into the skin daily. 5 pen 0  . MELATONIN PO Take by mouth daily.    . promethazine (PHENERGAN) 12.5 MG tablet Take 1-2 tablets (12.5-25 mg total) by mouth every 8 (eight) hours as needed for nausea or vomiting. 30 tablet 1  . tiZANidine (ZANAFLEX) 4 MG tablet Take 0.5-1 tablets (2-4 mg total) by mouth every 6 (six) hours as needed for muscle spasms. Not more than 3 times daily. 90 tablet 0   No current facility-administered medications on file prior to visit.     PAST MEDICAL HISTORY: Past Medical History:  Diagnosis Date  . Anemia   . Anxiety   . Arthritis   . Constipation   . Depression   . Fibroids   . 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 Lactose intolerance   . Palpitations   .  Shortness of breath   . 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 ABDOMINAL HYSTERECTOMY  2007  . CESAREAN SECTION  11/19/1989  . 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 SPINE SURGERY  07/13/2015   Lumbar - Dr. Christia Reading  . TOOTH EXTRACTION  04/16/2017  . TUBAL LIGATION  05/09/1993    SOCIAL HISTORY: Social History   Tobacco Use  . Smoking status: Former Smoker    Years: 1.00    Types: Cigarettes  . Smokeless tobacco: Never Used  Substance Use Topics  . Alcohol use: Yes    Alcohol/week: 0.6 oz    Types: 1 Standard drinks or equivalent per week    Comment: social  . Drug use: No    FAMILY HISTORY: Family History  Problem Relation Age of Onset  . Diabetes Mother   . Hypertension Mother   . Diabetes Brother   . Hypertension Brother   . Cancer Father   . Endometriosis Daughter   . Diabetes Daughter   . HIV Son     ROS: Review of Systems  Constitutional: Positive for weight loss.  Gastrointestinal: Negative for nausea.  Endo/Heme/Allergies:       Negative polyphagia Negative hypoglycemia    PHYSICAL EXAM: Blood pressure  120/75, pulse 63, temperature 97.9 F (36.6 C), temperature source Oral, height 5\' 7"  (1.702 m), weight 211 lb (95.7 kg), SpO2 100 %. Body mass index is 33.05 kg/m. Physical Exam  Constitutional: She is oriented to person, place, and time. She appears well-developed and well-nourished.  Cardiovascular: Normal rate.  Pulmonary/Chest: Effort normal.  Musculoskeletal: Normal range of motion.  Neurological: She is oriented to person, place, and time.  Skin: Skin is warm and dry.  Psychiatric: She has a normal mood and affect. Her behavior is normal.  Vitals reviewed.   RECENT LABS AND TESTS: BMET    Component Value Date/Time   NA 140 06/05/2017 1500   K 4.2 06/05/2017 1500   CL 104 06/05/2017 1500   CO2 21 06/05/2017 1500    GLUCOSE 114 (H) 06/05/2017 1500   GLUCOSE 103 (H) 07/24/2016 1121   BUN 10 06/05/2017 1500   CREATININE 0.99 06/05/2017 1500   CREATININE 1.10 07/24/2016 1121   CALCIUM 9.6 06/05/2017 1500   GFRNONAA 67 06/05/2017 1500   GFRAA 77 06/05/2017 1500   Lab Results  Component Value Date   HGBA1C 6.1 (H) 06/05/2017   HGBA1C 6.1 (H) 03/06/2017   HGBA1C 6.0 (H) 08/24/2016   Lab Results  Component Value Date   INSULIN 27.3 (H) 03/06/2017   INSULIN 31.7 (H) 08/24/2016   CBC    Component Value Date/Time   WBC 7.5 08/24/2016 1018   WBC 6.1 10/12/2015 1445   RBC 4.77 08/24/2016 1018   RBC 4.85 10/12/2015 1445   HGB 13.5 08/24/2016 1018   HCT 41.2 08/24/2016 1018   PLT 276 08/24/2016 1018   MCV 86 08/24/2016 1018   MCH 28.3 08/24/2016 1018   MCH 28.2 10/12/2015 1445   MCHC 32.8 08/24/2016 1018   MCHC 33.6 10/12/2015 1445   RDW 14.6 08/24/2016 1018   LYMPHSABS 3.5 (H) 08/24/2016 1018   MONOABS 610 10/12/2015 1445   EOSABS 0.2 08/24/2016 1018   BASOSABS 0.0 08/24/2016 1018   Iron/TIBC/Ferritin/ %Sat No results found for: IRON, TIBC, FERRITIN, IRONPCTSAT Lipid Panel     Component Value Date/Time   CHOL 160 06/05/2017 1500   TRIG 115 06/05/2017 1500   HDL 41 06/05/2017 1500   CHOLHDL 3.9 06/05/2017 1500   CHOLHDL 3.6 10/12/2015 1445   VLDL 19 10/12/2015 1445   LDLCALC 96 06/05/2017 1500   Hepatic Function Panel     Component Value Date/Time   PROT 6.9 06/05/2017 1500   ALBUMIN 3.8 06/05/2017 1500   AST 12 06/05/2017 1500   ALT 6 06/05/2017 1500   ALKPHOS 109 06/05/2017 1500   BILITOT 0.3 06/05/2017 1500      Component Value Date/Time   TSH 1.940 08/24/2016 1018   TSH 3.92 07/24/2016 1121   TSH 2.79 10/12/2015 1445    ASSESSMENT AND PLAN: Essential hypertension  OSA (obstructive sleep apnea)  Class 2 severe obesity with serious comorbidity and body mass index (BMI) of 39.0 to 39.9 in adult, unspecified obesity type ([place])  PLAN:  Pre-Diabetes Sarah Duncan will  continue to work on weight loss, diet, exercise, and decreasing simple carbohydrates in her diet to help decrease the risk of diabetes. We dicussed metformin including benefits and risks. She was informed that eating too many simple carbohydrates or too many calories at one sitting increases the likelihood of GI side effects. Sarah Duncan declined metformin for now and a prescription was not written today. Sarah Duncan agrees to follow up with our clinic in 2 weeks as directed to monitor her  progress.  We spent > than 50% of the 15 minute visit on the counseling as documented in the note.  Obesity Sarah Duncan is currently in the action stage of change. As such, her goal is to continue with weight loss efforts She has agreed to follow the Category 2 plan Sarah Duncan has been instructed to work up to a goal of 150 minutes of combined cardio and strengthening exercise per week for weight loss and overall health benefits. We discussed the following Behavioral Modification Strategies today: increasing lean protein intake and better snacking choices We discussed various medication options to help Sarah Duncan with her weight loss efforts and we both agreed to continue Saxenda as prescribed.   Sarah Duncan has agreed to follow up with our clinic in 2 weeks. She was informed of the importance of frequent follow up visits to maximize her success with intensive lifestyle modifications for her multiple health conditions.   OBESITY BEHAVIORAL INTERVENTION VISIT  Today's visit was # 14 out of 22.  Starting weight: 224 lbs Starting date: 08/24/16 Today's weight : 211 lbs Today's date: 07/31/2017 Total lbs lost to date: 13 (Patients must lose 7 lbs in the first 6 months to continue with counseling)   ASK: We discussed the diagnosis of obesity with Sarah Duncan today and Sarah Duncan agreed to give [place] permission to discuss obesity behavioral modification therapy today.  ASSESS: Sarah Duncan has the diagnosis of obesity and her BMI today is  33.04 Sarah Duncan is in the action stage of change   ADVISE: Annita was educated on the multiple health risks of obesity as well as the benefit of weight loss to improve her health. She was advised of the need for long term treatment and the importance of lifestyle modifications.  AGREE: Multiple dietary modification options and treatment options were discussed and  Sarah Duncan agreed to the above obesity treatment plan.   Wilhemena Durie, am acting as transcriptionist for Lacy Duverney, PA-C I, Lacy Duverney [facility], have reviewed this note and agree with its content

## 2017-08-01 ENCOUNTER — Ambulatory Visit (INDEPENDENT_AMBULATORY_CARE_PROVIDER_SITE_OTHER): Payer: 59

## 2017-08-01 ENCOUNTER — Other Ambulatory Visit: Payer: Self-pay

## 2017-08-01 ENCOUNTER — Encounter (HOSPITAL_COMMUNITY): Payer: Self-pay | Admitting: Family Medicine

## 2017-08-01 ENCOUNTER — Ambulatory Visit (HOSPITAL_COMMUNITY)
Admission: EM | Admit: 2017-08-01 | Discharge: 2017-08-01 | Disposition: A | Discharge status: Home / Self Care | Payer: 59 | Attending: Family Medicine | Admitting: Family Medicine

## 2017-08-01 DIAGNOSIS — G8929 Other chronic pain: Secondary | ICD-10-CM

## 2017-08-01 DIAGNOSIS — S92424A Nondisplaced fracture of distal phalanx of right great toe, initial encounter for closed fracture: Secondary | ICD-10-CM | POA: Diagnosis not present

## 2017-08-01 DIAGNOSIS — W19XXXA Unspecified fall, initial encounter: Secondary | ICD-10-CM

## 2017-08-01 DIAGNOSIS — M549 Dorsalgia, unspecified: Secondary | ICD-10-CM

## 2017-08-01 DIAGNOSIS — S62667A Nondisplaced fracture of distal phalanx of left little finger, initial encounter for closed fracture: Secondary | ICD-10-CM | POA: Diagnosis not present

## 2017-08-01 HISTORY — DX: Other cervical disc degeneration, unspecified cervical region: M50.30

## 2017-08-01 MED ORDER — OXYCODONE-ACETAMINOPHEN 5-325 MG PO TABS: 1.0000 | ORAL_TABLET | ORAL | 0 refills | Status: DC | PRN

## 2017-08-01 NOTE — Discharge Instructions (Addendum)
Make an appointment to see Dr. Lorin Mercy.

## 2017-08-01 NOTE — ED Provider Notes (Signed)
[place]   [id number] 08/01/17 Arrival Time: 1549   SUBJECTIVE:  Sarah Duncan is a 52 y.o. female who presents to the urgent care with complaint of right big toe pain after fell yesterday and states has chronic back pain which has flared up after fall  Patient has been disabled with recurring lumbar back pain for 3 years, having had surgery in the past.  No sciatic symptoms or unilateral weakness.  No fever or urinary incontinence.  Past Medical History:  Diagnosis Date  . Anemia   . Anxiety   . Arthritis   . Constipation   . Degeneration of cervical intervertebral disc   . Depression   . Fibroids   . 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 Lactose intolerance   . Palpitations   . Shortness of breath   . 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 Diabetes Mother   . Hypertension Mother   . Diabetes Brother   . Hypertension Brother   . Cancer Father   . Endometriosis Daughter   . Diabetes Daughter   . HIV Son    Social History   Socioeconomic History  . Marital status: Married    Spouse name: Ronni Rumble  . Number of children: 4  . Years of education: associates  . Highest education level: Not on file  Social Needs  . Financial resource strain: Not on file  . Food insecurity - worry: Not on file  . Food insecurity - inability: Not on file  . Transportation needs - medical: Not on file  . Transportation needs - non-medical: Not on file  Occupational History  . 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 Smoking status: Former Smoker    Years: 1.00    Types: Cigarettes  . Smokeless tobacco: Never Used  Substance and Sexual Activity  . Alcohol use: Yes    Alcohol/week: 0.6 oz    Types: 1 Standard drinks or equivalent per week    Comment: social  . Drug use: No  . Sexual activity: Yes    Partners: Female  Other  Topics Concern  . Not on file  Social History Narrative   Divorced. 4 adult children (3 in [place], one in [place]).   Re-Married 05/2015. Lives with her wife.   Her wife's children live locally.   Current Meds  Medication Sig  . methocarbamol (ROBAXIN) 500 MG tablet Take 500 mg by mouth 4 (four) times daily.   No Known Allergies    ROS: As per HPI, remainder of ROS negative.   OBJECTIVE:   Vitals:   08/01/17 1701  BP: 140/90  Pulse: 95  Temp: 97.8 F (36.6 C)  TempSrc: Oral  SpO2: 100%     General appearance: alert; no distress Eyes: PERRL; EOMI; conjunctiva normal HENT: normocephalic; atraumatic;  oral mucosa normal Neck: supple Back: no CVA tenderness;  Tender at L5 midline with deep palpation Extremities: no cyanosis or edema; symmetrical with no gross deformities, SLR negative;  No STS or deformity right toe which is tender. Skin: warm and dry Neurologic: normal gait; grossly normal Psychological: alert and cooperative; normal mood and affect      Labs:  Results for orders placed or performed in visit on 06/05/17  Comprehensive metabolic panel  Result Value Ref Range   Glucose 114 (H) 65 - 99 mg/dL  BUN 10 6 - 24 mg/dL   Creatinine, Ser 0.99 0.57 - 1.00 mg/dL   GFR calc non Af Amer 67 >59 mL/min/1.73   GFR calc Af Amer 77 >59 mL/min/1.73   BUN/Creatinine Ratio 10 9 - 23   Sodium 140 134 - 144 mmol/L   Potassium 4.2 3.5 - 5.2 mmol/L   Chloride 104 96 - 106 mmol/L   CO2 21 20 - 29 mmol/L   Calcium 9.6 8.7 - 10.2 mg/dL   Total Protein 6.9 6.0 - 8.5 g/dL   Albumin 3.8 3.5 - 5.5 g/dL   Globulin, Total 3.1 1.5 - 4.5 g/dL   Albumin/Globulin Ratio 1.2 1.2 - 2.2   Bilirubin Total 0.3 0.0 - 1.2 mg/dL   Alkaline Phosphatase 109 39 - 117 IU/L   AST 12 0 - 40 IU/L   ALT 6 0 - 32 IU/L  Hemoglobin A1c  Result Value Ref Range   Hgb A1c MFr Bld 6.1 (H) 4.8 - 5.6 %   Est. average glucose Bld gHb Est-mCnc 128 mg/dL  Lipid panel  Result Value Ref Range    Cholesterol, Total 160 100 - 199 mg/dL   Triglycerides 115 0 - 149 mg/dL   HDL 41 >39 mg/dL   VLDL Cholesterol Cal 23 5 - 40 mg/dL   LDL Calculated 96 0 - 99 mg/dL   Chol/HDL Ratio 3.9 0.0 - 4.4 ratio    Labs Reviewed - No data to display  No results found.     ASSESSMENT & PLAN:  1. Chronic midline back pain, unspecified back location   2. Nondisplaced fracture of distal phalanx of right great toe, initial encounter for closed fracture     Meds ordered this encounter  Medications  . oxyCODONE-acetaminophen (PERCOCET/ROXICET) 5-325 MG tablet    Sig: Take 1 tablet by mouth every 4 (four) hours as needed for severe pain.    Dispense:  15 tablet    Refill:  0    Reviewed expectations re: course of current medical issues. Questions answered. Outlined signs and symptoms indicating need for more acute intervention. Patient verbalized understanding. After Visit Summary given.    Procedures:      Robyn Haber, MD 08/01/17 1755

## 2017-08-01 NOTE — ED Triage Notes (Signed)
C/o right big toe pain after fell yesterday and states has chronic back pain which has flared up after fall

## 2017-08-02 ENCOUNTER — Ambulatory Visit (INDEPENDENT_AMBULATORY_CARE_PROVIDER_SITE_OTHER): Payer: 59 | Admitting: Surgery

## 2017-08-02 ENCOUNTER — Encounter (INDEPENDENT_AMBULATORY_CARE_PROVIDER_SITE_OTHER): Payer: Self-pay | Admitting: Surgery

## 2017-08-02 ENCOUNTER — Encounter (INDEPENDENT_AMBULATORY_CARE_PROVIDER_SITE_OTHER): Payer: Self-pay | Admitting: Orthopaedic Surgery

## 2017-08-02 VITALS — BP 138/87 | HR 88

## 2017-08-02 DIAGNOSIS — S92424A Nondisplaced fracture of distal phalanx of right great toe, initial encounter for closed fracture: Secondary | ICD-10-CM | POA: Diagnosis not present

## 2017-08-02 DIAGNOSIS — M47816 Spondylosis without myelopathy or radiculopathy, lumbar region: Secondary | ICD-10-CM | POA: Diagnosis not present

## 2017-08-02 MED ORDER — METHYLPREDNISOLONE 4 MG PO TABS: ORAL_TABLET | ORAL | 0 refills | Status: DC

## 2017-08-02 MED FILL — OXYCOD/ACETAMINOPHEN 5-325M: 5-325 | 3 days supply | Qty: 15 | Fill #0

## 2017-08-02 MED FILL — METHYLPREDNISOLONE 4 MG TAB: 4 | 6 days supply | Qty: 21 | Fill #0

## 2017-08-02 NOTE — Progress Notes (Signed)
Office Visit Note   [name]           Date of Birth: 1966-05-04           MRN: [id number] Visit Date: 06/26/2017              Requested by: Harrison Mons, PA-C [address] [place], [place] [address] PCP: Harrison Mons, PA-C   Assessment & Plan: Visit Diagnoses:  1. Chronic low back pain, unspecified back pain laterality, with sciatica presence unspecified     Plan:'s.  We will obtain an MRI scan to rule out disc recurrence impression at adjacent level.  Previous surgery at L4-5 done in [place].  Office follow-up after MRI scan.  Follow-Up Instructions: No Follow-up on file.   Orders:  Orders Placed This Encounter  Procedures  . MR Lumbar Spine w/o contrast   No orders of the defined types were placed in this encounter.     Procedures: No procedures performed   Clinical Data: No additional findings.   Subjective: Chief Complaint  Patient presents with  . Lower Back - Pain, Follow-up    HPI 52 year old female returns with ongoing problems with back pain.  Problems with obesity, depression and prediabetes.  Previous L4-5 lumbar microdiscectomy surgery done in [place]. Continues to have back pain with activity.  He states the pain radiates into her legs.  Treated with anti-inflammatories, Cymbalta, Skelaxin. Review of Systems positive for prediabetes previous lumbar surgery.  Increased BMI, history of depression.   Objective: Vital Signs: BP 124/85   Pulse 67   Ht 5\' 8"  (1.727 m)   Wt 219 lb (99.3 kg)   BMI 33.30 kg/m   Physical Exam  Constitutional: She is oriented to person, place, and time. She appears well-developed.  HENT:  Head: Normocephalic and atraumatic.  Eyes: EOM are normal. Pupils are equal, round, and reactive to light.  Neck: No tracheal deviation present. No thyromegaly present.  Cardiovascular: Normal rate.  Pulmonary/Chest: Effort normal.  Abdominal: Soft.  Neurological: She is alert and oriented to  person, place, and time.  Skin: Skin is warm and dry.  Psychiatric: She has a normal mood and affect. Her behavior is normal.    Ortho Exam well-healed lumbar incision.  Crepitus with knee range of motion.  2+.  .  Lower extremity reflexes.  Heel toe gait.  Tib gastrocsoleus weakness.  Specialty Comments:  No specialty comments available.  Imaging: No results found.   PMFS History: Patient Active Problem List   Diagnosis Date Noted  . Essential hypertension 07/31/2017  . Other hyperlipidemia 03/06/2017  . Class 2 obesity with serious comorbidity and body mass index (BMI) of 35.0 to 35.9 in adult 01/08/2017  . Other chronic pain 12/04/2016  . Prediabetes 10/18/2016  . Vitamin D deficiency 10/18/2016  . Depression 11/23/2015  . 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 Chronic lower back pain 06/18/2015   Past Medical History:  Diagnosis Date  . Anemia   . Anxiety   . Arthritis   . Constipation   . Degeneration of cervical intervertebral disc   . Depression   . Fibroids   . 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 Lactose intolerance   . Palpitations   . Shortness of breath   . 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 Diabetes Mother   . Hypertension Mother   . Diabetes Brother   . Hypertension Brother   .  Cancer Father   . Endometriosis Daughter   . Diabetes Daughter   . HIV Son     Past Surgical History:  Procedure Laterality Date  . ABDOMINAL HYSTERECTOMY  2007  . CESAREAN SECTION  11/19/1989  . 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 SPINE SURGERY  07/13/2015   Lumbar - Dr. Christia Reading  . TOOTH EXTRACTION  04/16/2017  . TUBAL LIGATION  05/09/1993   Social History   Occupational History  . 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 Smoking status: Former Smoker    Years: 1.00    Types: Cigarettes  . Smokeless tobacco: Never Used   Substance and Sexual Activity  . Alcohol use: Yes    Alcohol/week: 0.6 oz    Types: 1 Standard drinks or equivalent per week    Comment: social  . Drug use: No  . Sexual activity: Yes    Partners: Female

## 2017-08-02 NOTE — Progress Notes (Signed)
Office Visit Note   [name]           Date of Birth: 16-Jun-1965           MRN: [id number] Visit Date: 08/02/2017              Requested by: Harrison Mons, PA-C [address] [place], [place] [address] PCP: Harrison Mons, PA-C   Assessment & Plan: Visit Diagnoses:  1. Spondylosis without myelopathy or radiculopathy, lumbar region   2. Closed nondisplaced fracture of distal phalanx of right great toe, initial encounter     Plan: Patient was given prescription for Medrol Dosepak 6-day taper to be taken as directed to see if this helps with her lumbar symptoms.  For her great toe fracture I did give her a postop shoe and she can use this instead of the boot that was given at the urgent care.  Patient will follow with me in 2 weeks for recheck.  If her back continues to be a problem I will plan to schedule her for bilateral L4-5 facet injections with Dr. Ernestina Patches.  During my visit with patient she did request to change physicians for treatment regarding her spine.  After I have exhausted conservative treatment for her back I will refer patient to my other attending physician Dr. [name].  All questions answered.  Follow-Up Instructions: Return in about 2 weeks (around 08/16/2017) for With [name] for recheck back and foot.   Orders:  No orders of the defined types were placed in this encounter.  Meds ordered this encounter  Medications  . methylPREDNISolone (MEDROL) 4 MG tablet    Sig: 6-day taper to be taken as directed    Dispense:  21 tablet    Refill:  0      Procedures: No procedures performed   Clinical Data: No additional findings.   Subjective: Chief Complaint  Patient presents with  . Right Foot - Pain, Fracture    HPI Patient comes in today with complaints of low back pain and also new complaint of right great toe pain.  Patient states that 2 days ago her back locked up on her and she fell hitting her toe.  Went to the urgent care  and was found to have a  Nondisplaced acute fracture through midshaft of first distal Phalanx.  Patient was put in a walking boot.  She continues to have ongoing central low back pain.  Nothing radiating down the legs at this time although she has had a history of sciatica.  Previous lumbar surgery as documented by Dr. Lorin Mercy.  Back pain increased with twisting bending walking.  Previous lumbar spine MRI from July 09, 2017 showed:  The disc spaces from T10 to L3 are normal.  L3-L4: Small right subarticular disc protrusion and mild facet hypertrophy. Mild narrowing of the right lateral recess, unchanged. No spinal canal stenosis or neural foraminal stenosis.  L4-L5: Disc space narrowing severe right and moderate left facet hypertrophy, unchanged. No spinal canal stenosis. Narrowing of the right lateral recess is worsened. Moderate left neural foraminal stenosis is unchanged.  L5-S1: No disc herniation or stenosis  Patient has not had Medrol Dosepak taper or lumbar ESI's through our office.   Review of Systems No current cardiac pulmonary GI GU issues  Objective: Vital Signs: BP 138/87   Pulse 88   Physical Exam  Constitutional: She is oriented to person, place, and time. No distress.  HENT:  Head: Normocephalic and atraumatic.  Eyes: Pupils  are equal, round, and reactive to light.  Neck: Normal range of motion.  Abdominal: She exhibits no distension.  Musculoskeletal:  Patient has lumbar paraspinal tenderness.  Negative straight leg raise.  Neurovascular intact.  Right great toe does have some swelling.  Tender distal phalanx.  Neurological: She is alert and oriented to person, place, and time.  Skin: Skin is warm and dry.  Psychiatric: She has a normal mood and affect.    Ortho Exam  Specialty Comments:  No specialty comments available.  Imaging: Dg Foot Complete Right  Result Date: 08/01/2017 CLINICAL DATA:  52 y/o F; right great toe pain after fall yesterday.  EXAM: RIGHT FOOT COMPLETE - 3+ VIEW COMPARISON:  None. FINDINGS: Nondisplaced acute fracture through the midshaft of first distal phalanx best seen on lateral view. No joint dislocation or additional fracture identified. Dorsal calcaneal enthesophyte. Lisfranc alignment is maintained. IMPRESSION: Nondisplaced acute fracture through midshaft of first distal phalanx. Electronically Signed   By: Kristine Garbe M.D.   On: 08/01/2017 17:52     PMFS History: Patient Active Problem List   Diagnosis Date Noted  . Essential hypertension 07/31/2017  . Other hyperlipidemia 03/06/2017  . Class 2 obesity with serious comorbidity and body mass index (BMI) of 35.0 to 35.9 in adult 01/08/2017  . Other chronic pain 12/04/2016  . Prediabetes 10/18/2016  . Vitamin D deficiency 10/18/2016  . Depression 11/23/2015  . 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 Chronic lower back pain 06/18/2015   Past Medical History:  Diagnosis Date  . Anemia   . Anxiety   . Arthritis   . Constipation   . Degeneration of cervical intervertebral disc   . Depression   . Fibroids   . 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 Lactose intolerance   . Palpitations   . Shortness of breath   . 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 Diabetes Mother   . Hypertension Mother   . Diabetes Brother   . Hypertension Brother   . Cancer Father   . Endometriosis Daughter   . Diabetes Daughter   . HIV Son     Past Surgical History:  Procedure Laterality Date  . ABDOMINAL HYSTERECTOMY  2007  . CESAREAN SECTION  11/19/1989  . 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 SPINE SURGERY  07/13/2015   Lumbar - Dr. Christia Reading  . TOOTH EXTRACTION  04/16/2017  . TUBAL LIGATION  05/09/1993   Social History   Occupational History  . 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 Smoking status: Former Smoker     Years: 1.00    Types: Cigarettes  . Smokeless tobacco: Never Used  Substance and Sexual Activity  . Alcohol use: Yes    Alcohol/week: 0.6 oz    Types: 1 Standard drinks or equivalent per week    Comment: social  . Drug use: No  . Sexual activity: Yes    Partners: Female

## 2017-08-14 ENCOUNTER — Ambulatory Visit (INDEPENDENT_AMBULATORY_CARE_PROVIDER_SITE_OTHER): Payer: 59 | Admitting: Physician Assistant

## 2017-08-14 VITALS — BP 116/76 | HR 81 | Temp 98.1°F | Ht 67.0 in | Wt 215.0 lb

## 2017-08-14 DIAGNOSIS — Z6833 Body mass index (BMI) 33.0-33.9, adult: Secondary | ICD-10-CM | POA: Diagnosis not present

## 2017-08-14 DIAGNOSIS — R7303 Prediabetes: Secondary | ICD-10-CM

## 2017-08-14 DIAGNOSIS — E669 Obesity, unspecified: Secondary | ICD-10-CM

## 2017-08-14 NOTE — Progress Notes (Signed)
Office: [phone]  /  Fax: [phone]   HPI:   Chief Complaint: OBESITY Sarah Duncan is here to discuss her progress with her obesity treatment plan. She is on the Category 2 plan and is following her eating plan approximately 40 % of the time. She states she is walking for 30 minutes 3 times per week. Sarah Duncan had increased emotional eating after putting her dog down. She continues to have challenges with pre-planning her meals. Her weight is 215 lb (97.5 kg) today and has had a weight gain of 4 pounds over a period of 2 weeks since her last visit. She has lost 9 lbs since starting treatment with Sarah Duncan.  Pre-Diabetes Candid has a diagnosis of pre-diabetes based on her elevated Hgb A1c and was informed this puts her at greater risk of developing diabetes. She is not taking metformin currently and continues to work on diet and exercise to decrease risk of diabetes. She denies nausea, polyphagia or hypoglycemia.  ALLERGIES: No Known Allergies  MEDICATIONS: Current Outpatient Medications on File Prior to Visit  Medication Sig Dispense Refill  . DULoxetine (CYMBALTA) 30 MG capsule Take 1 capsule (30 mg total) by mouth daily. 30 capsule 0  . MELATONIN PO Take by mouth daily.    . methocarbamol (ROBAXIN) 500 MG tablet Take 500 mg by mouth 4 (four) times daily.    . methylPREDNISolone (MEDROL) 4 MG tablet 6-day taper to be taken as directed 21 tablet 0  . oxyCODONE-acetaminophen (PERCOCET/ROXICET) 5-325 MG tablet Take 1 tablet by mouth every 4 (four) hours as needed for severe pain. 15 tablet 0  . promethazine (PHENERGAN) 12.5 MG tablet Take 1-2 tablets (12.5-25 mg total) by mouth every 8 (eight) hours as needed for nausea or vomiting. 30 tablet 1  . tiZANidine (ZANAFLEX) 4 MG tablet Take 0.5-1 tablets (2-4 mg total) by mouth every 6 (six) hours as needed for muscle spasms. Not more than 3 times daily. 90 tablet 0   No current facility-administered medications on file prior to visit.     PAST  MEDICAL HISTORY: Past Medical History:  Diagnosis Date  . Anemia   . Anxiety   . Arthritis   . Constipation   . Degeneration of cervical intervertebral disc   . Depression   . Fibroids   . 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 Lactose intolerance   . Palpitations   . Shortness of breath   . 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 ABDOMINAL HYSTERECTOMY  2007  . CESAREAN SECTION  11/19/1989  . 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 SPINE SURGERY  07/13/2015   Lumbar - Dr. Christia Reading  . TOOTH EXTRACTION  04/16/2017  . TUBAL LIGATION  05/09/1993    SOCIAL HISTORY: Social History   Tobacco Use  . Smoking status: Former Smoker    Years: 1.00    Types: Cigarettes  . Smokeless tobacco: Never Used  Substance Use Topics  . Alcohol use: Yes    Alcohol/week: 0.6 oz    Types: 1 Standard drinks or equivalent per week    Comment: social  . Drug use: No    FAMILY HISTORY: Family History  Problem Relation Age of Onset  . Diabetes Mother   . Hypertension Mother   . Diabetes Brother   . Hypertension Brother   . Cancer Father   . Endometriosis Daughter   .  Diabetes Daughter   . HIV Son     ROS: Review of Systems  Constitutional: Negative for weight loss.  Gastrointestinal: Negative for nausea.  Endo/Heme/Allergies:       Negative for polyphagia Negative for hypoglycemia    PHYSICAL EXAM: Blood pressure 116/76, pulse 81, temperature 98.1 F (36.7 C), temperature source Oral, height 5\' 7"  (1.702 m), weight 215 lb (97.5 kg), SpO2 99 %. Body mass index is 33.67 kg/m. Physical Exam  Constitutional: She is oriented to person, place, and time. She appears well-developed and well-nourished.  Cardiovascular: Normal rate.  Pulmonary/Chest: Effort normal.  Musculoskeletal: Normal range of motion.  Neurological: She is oriented to person, place, and  time.  Skin: Skin is warm and dry.  Psychiatric: She has a normal mood and affect. Her behavior is normal.  Vitals reviewed.   RECENT LABS AND TESTS: BMET    Component Value Date/Time   NA 140 06/05/2017 1500   K 4.2 06/05/2017 1500   CL 104 06/05/2017 1500   CO2 21 06/05/2017 1500   GLUCOSE 114 (H) 06/05/2017 1500   GLUCOSE 103 (H) 07/24/2016 1121   BUN 10 06/05/2017 1500   CREATININE 0.99 06/05/2017 1500   CREATININE 1.10 07/24/2016 1121   CALCIUM 9.6 06/05/2017 1500   GFRNONAA 67 06/05/2017 1500   GFRAA 77 06/05/2017 1500   Lab Results  Component Value Date   HGBA1C 6.1 (H) 06/05/2017   HGBA1C 6.1 (H) 03/06/2017   HGBA1C 6.0 (H) 08/24/2016   Lab Results  Component Value Date   INSULIN 27.3 (H) 03/06/2017   INSULIN 31.7 (H) 08/24/2016   CBC    Component Value Date/Time   WBC 7.5 08/24/2016 1018   WBC 6.1 10/12/2015 1445   RBC 4.77 08/24/2016 1018   RBC 4.85 10/12/2015 1445   HGB 13.5 08/24/2016 1018   HCT 41.2 08/24/2016 1018   PLT 276 08/24/2016 1018   MCV 86 08/24/2016 1018   MCH 28.3 08/24/2016 1018   MCH 28.2 10/12/2015 1445   MCHC 32.8 08/24/2016 1018   MCHC 33.6 10/12/2015 1445   RDW 14.6 08/24/2016 1018   LYMPHSABS 3.5 (H) 08/24/2016 1018   MONOABS 610 10/12/2015 1445   EOSABS 0.2 08/24/2016 1018   BASOSABS 0.0 08/24/2016 1018   Iron/TIBC/Ferritin/ %Sat No results found for: IRON, TIBC, FERRITIN, IRONPCTSAT Lipid Panel     Component Value Date/Time   CHOL 160 06/05/2017 1500   TRIG 115 06/05/2017 1500   HDL 41 06/05/2017 1500   CHOLHDL 3.9 06/05/2017 1500   CHOLHDL 3.6 10/12/2015 1445   VLDL 19 10/12/2015 1445   LDLCALC 96 06/05/2017 1500   Hepatic Function Panel     Component Value Date/Time   PROT 6.9 06/05/2017 1500   ALBUMIN 3.8 06/05/2017 1500   AST 12 06/05/2017 1500   ALT 6 06/05/2017 1500   ALKPHOS 109 06/05/2017 1500   BILITOT 0.3 06/05/2017 1500      Component Value Date/Time   TSH 1.940 08/24/2016 1018   TSH 3.92  07/24/2016 1121   TSH 2.79 10/12/2015 1445   Results for TERRYL, NIZIOLEK (MRN [id number]) as of 08/14/2017 14:46  Ref. Range 03/06/2017 09:05  Vitamin D, 25-Hydroxy Latest Ref Range: 30.0 - 100.0 ng/mL 35.0   ASSESSMENT AND PLAN: Prediabetes  Class 1 obesity with serious comorbidity and body mass index (BMI) of 33.0 to 33.9 in adult, unspecified obesity type  PLAN:  Pre-Diabetes Lithzy will continue to work on weight loss, exercise, and decreasing simple carbohydrates in  her diet to help decrease the risk of diabetes. We dicussed metformin including benefits and risks. She was informed that eating too many simple carbohydrates or too many calories at one sitting increases the likelihood of GI side effects. Saba declined metformin for now and a prescription was not written today. Anitria agreed to follow up with Sarah Duncan as directed to monitor her progress.  We spent > than 50% of the 15 minute visit on the counseling as documented in the note.  Obesity Meyli is currently in the action stage of change. As such, her goal is to continue with weight loss efforts She has agreed to follow the Category 2 plan Chiante has been instructed to work up to a goal of 150 minutes of combined cardio and strengthening exercise per week for weight loss and overall health benefits. We discussed the following Behavioral Modification Strategies today: increasing lean protein intake and work on meal planning and easy cooking plans  Viridiana has agreed to follow up with our clinic in 2 weeks. She was informed of the importance of frequent follow up visits to maximize her success with intensive lifestyle modifications for her multiple health conditions.   OBESITY BEHAVIORAL INTERVENTION VISIT  Today's visit was # 15 out of 22.  Starting weight: 224 lbs Starting date: 08/24/16 Today's weight : 215 lbs  Today's date: 08/14/2017 Total lbs lost to date: 9 (Patients must lose 7 lbs in the first 6 months to continue  with counseling)   ASK: We discussed the diagnosis of obesity with Sarah Duncan today and Sarah Duncan agreed to give Sarah Duncan permission to discuss obesity behavioral modification therapy today.  ASSESS: Sarah Duncan has the diagnosis of obesity and her BMI today is 33.67 Sarah Duncan is in the action stage of change   ADVISE: Sarah Duncan was educated on the multiple health risks of obesity as well as the benefit of weight loss to improve her health. She was advised of the need for long term treatment and the importance of lifestyle modifications.  AGREE: Multiple dietary modification options and treatment options were discussed and  Sarah Duncan agreed to the above obesity treatment plan.   Corey Skains, am acting as transcriptionist for [employer], PA-C I, Lacy Duverney [facility], have reviewed this note and agree with its content

## 2017-08-16 ENCOUNTER — Ambulatory Visit (INDEPENDENT_AMBULATORY_CARE_PROVIDER_SITE_OTHER): Payer: 59 | Admitting: Surgery

## 2017-08-16 ENCOUNTER — Encounter (INDEPENDENT_AMBULATORY_CARE_PROVIDER_SITE_OTHER): Payer: Self-pay | Admitting: Surgery

## 2017-08-16 VITALS — BP 130/86 | HR 78

## 2017-08-16 DIAGNOSIS — S92424A Nondisplaced fracture of distal phalanx of right great toe, initial encounter for closed fracture: Secondary | ICD-10-CM

## 2017-08-16 DIAGNOSIS — M47816 Spondylosis without myelopathy or radiculopathy, lumbar region: Secondary | ICD-10-CM

## 2017-08-16 NOTE — Progress Notes (Signed)
**Note De-Identified Sarah Obfuscation** Office Visit Note   [name]           Date of Birth: 18-Jun-1965           MRN: [id number] Visit Date: 08/16/2017              Requested by: Harrison Mons, PA-C [address] [place], [place] [address] PCP: Harrison Mons, PA-C   Assessment & Plan: Visit Diagnoses:  1. Spondylosis without myelopathy or radiculopathy, lumbar region   2. Closed nondisplaced fracture of distal phalanx of right great toe, initial encounter     Plan: Patient's ongoing back pain will refer for facet injections.  Follow-up in a few weeks to check her response.  Continue conservative management for her foot.  Follow-Up Instructions: Return in about 3 weeks (around 09/06/2017) for With [name] after lumbar facet injections.   Orders:  Orders Placed This Encounter  Procedures  . Ambulatory referral to Physical Medicine Rehab   No orders of the defined types were placed in this encounter.     Procedures: No procedures performed   Clinical Data: No additional findings.   Subjective: Chief Complaint  Patient presents with  . Lower Back - Pain  . Toe Injury    follow up    HPI 52 year old white female history of low back pain returns.  States that back pain unchanged.  Localizes pain to the central lumbar spine.  Patient also suffered a fall on July 31, 2017 and was seen in the [place] urgent care.  Diagnosed with a nondisplaced acute fracture through midshaft of the first distal phalanx. Review of Systems No current cardiac pulmonary GI GU issues  Objective: Vital Signs: BP 130/86   Pulse 78   Physical Exam  Constitutional: She is oriented to person, place, and time. No distress.  HENT:  Head: Normocephalic and atraumatic.  Eyes: Pupils are equal, round, and reactive to light.  Pulmonary/Chest: No respiratory distress.  Musculoskeletal:  Gait antalgic.  Bilateral lumbar paraspinal tenderness.  Negative straight leg raise.  Negative logroll bilateral  hips.  Bilateral calves nontender.  Neurovascular intact.  Neurological: She is alert and oriented to person, place, and time.  Skin: Skin is warm and dry.    Ortho Exam  Specialty Comments:  No specialty comments available.  Imaging: Xr C-arm No Report  Result Date: 09/03/2017 Please see Notes or Procedures tab for imaging impression.    PMFS History: Patient Active Problem List   Diagnosis Date Noted  . Essential hypertension 07/31/2017  . Other hyperlipidemia 03/06/2017  . Class 2 obesity with serious comorbidity and body mass index (BMI) of 35.0 to 35.9 in adult 01/08/2017  . Other chronic pain 12/04/2016  . Prediabetes 10/18/2016  . Vitamin D deficiency 10/18/2016  . Depression 11/23/2015  . 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 Chronic lower back pain 06/18/2015   Past Medical History:  Diagnosis Date  . Anemia   . Anxiety   . Arthritis   . Constipation   . Degeneration of cervical intervertebral disc   . Depression   . Fibroids   . 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 Lactose intolerance   . Palpitations   . Shortness of breath   . 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 Diabetes Mother   . Hypertension Mother   . Diabetes Brother   . Hypertension Brother   . Cancer Father   .  Endometriosis Daughter   . Diabetes Daughter   . HIV Son     Past Surgical History:  Procedure Laterality Date  . ABDOMINAL HYSTERECTOMY  2007  . CESAREAN SECTION  11/19/1989  . 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 SPINE SURGERY  07/13/2015   Lumbar - Dr. Christia Reading  . TOOTH EXTRACTION  04/16/2017  . TUBAL LIGATION  05/09/1993   Social History   Occupational History  . 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 Smoking status: Former Smoker    Years: 1.00    Types: Cigarettes  . Smokeless tobacco: Never Used  Substance and Sexual  Activity  . Alcohol use: Yes    Alcohol/week: 0.6 oz    Types: 1 Standard drinks or equivalent per week    Comment: social  . Drug use: No  . Sexual activity: Yes    Partners: Female

## 2017-08-23 ENCOUNTER — Encounter: Payer: Self-pay | Admitting: Physician Assistant

## 2017-08-29 ENCOUNTER — Ambulatory Visit (INDEPENDENT_AMBULATORY_CARE_PROVIDER_SITE_OTHER): Payer: 59 | Admitting: Physician Assistant

## 2017-08-29 VITALS — BP 116/75 | HR 73 | Temp 98.1°F | Ht 67.0 in | Wt 214.0 lb

## 2017-08-29 DIAGNOSIS — Z6833 Body mass index (BMI) 33.0-33.9, adult: Secondary | ICD-10-CM | POA: Diagnosis not present

## 2017-08-29 DIAGNOSIS — E669 Obesity, unspecified: Secondary | ICD-10-CM | POA: Diagnosis not present

## 2017-08-29 DIAGNOSIS — R7303 Prediabetes: Secondary | ICD-10-CM | POA: Diagnosis not present

## 2017-08-29 NOTE — Progress Notes (Signed)
Office: [phone]  /  Fax: [phone]   HPI:   Chief Complaint: OBESITY Sarah Duncan is here to discuss her progress with her obesity treatment plan. She is on the Category 2 plan and is following her eating plan approximately 55-70 % of the time. She states she is walking for 30 minutes 2 times per week. Sarah Duncan continues to do well with weight loss. She has been working on pre-planning her meals and has decreased her eating out.  Her weight is 214 lb (97.1 kg) today and has had a weight loss of 1 pound over a period of 2 weeks since her last visit. She has lost 10 lbs since starting treatment with [place].  Pre-Diabetes Sarah Duncan has a diagnosis of pre-diabetes based on her elevated Hgb A1c and was informed this puts her at greater risk of developing diabetes. She is not on metformin and declines and continues to work on diet and exercise to decrease risk of diabetes. She denies polyphagia, nausea or hypoglycemia.  ALLERGIES: No Known Allergies  MEDICATIONS: Current Outpatient Medications on File Prior to Visit  Medication Sig Dispense Refill  . DULoxetine (CYMBALTA) 30 MG capsule Take 1 capsule (30 mg total) by mouth daily. 30 capsule 0  . MELATONIN PO Take by mouth daily.    . methocarbamol (ROBAXIN) 500 MG tablet Take 500 mg by mouth 4 (four) times daily.    Marland Kitchen oxyCODONE-acetaminophen (PERCOCET/ROXICET) 5-325 MG tablet Take 1 tablet by mouth every 4 (four) hours as needed for severe pain. 15 tablet 0  . promethazine (PHENERGAN) 12.5 MG tablet Take 1-2 tablets (12.5-25 mg total) by mouth every 8 (eight) hours as needed for nausea or vomiting. 30 tablet 1  . tiZANidine (ZANAFLEX) 4 MG tablet Take 0.5-1 tablets (2-4 mg total) by mouth every 6 (six) hours as needed for muscle spasms. Not more than 3 times daily. 90 tablet 0   No current facility-administered medications on file prior to visit.     PAST MEDICAL HISTORY: Past Medical History:  Diagnosis Date  . Anemia   . Anxiety   . Arthritis    . Constipation   . Degeneration of cervical intervertebral disc   . Depression   . Fibroids   . 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 Lactose intolerance   . Palpitations   . Shortness of breath   . 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 ABDOMINAL HYSTERECTOMY  2007  . CESAREAN SECTION  11/19/1989  . 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 SPINE SURGERY  07/13/2015   Lumbar - Dr. Christia Reading  . TOOTH EXTRACTION  04/16/2017  . TUBAL LIGATION  05/09/1993    SOCIAL HISTORY: Social History   Tobacco Use  . Smoking status: Former Smoker    Years: 1.00    Types: Cigarettes  . Smokeless tobacco: Never Used  Substance Use Topics  . Alcohol use: Yes    Alcohol/week: 0.6 oz    Types: 1 Standard drinks or equivalent per week    Comment: social  . Drug use: No    FAMILY HISTORY: Family History  Problem Relation Age of Onset  . Diabetes Mother   . Hypertension Mother   . Diabetes Brother   . Hypertension Brother   . Cancer Father   . Endometriosis Daughter   . Diabetes Daughter   . HIV Son  ROS: Review of Systems  Constitutional: Positive for weight loss.  Gastrointestinal: Negative for nausea.  Endo/Heme/Allergies:       Negative polyphagia Negative hypoglycemia    PHYSICAL EXAM: Blood pressure 116/75, pulse 73, temperature 98.1 F (36.7 C), temperature source Oral, height 5\' 7"  (1.702 m), weight 214 lb (97.1 kg), SpO2 98 %. Body mass index is 33.52 kg/m. Physical Exam  Constitutional: She is oriented to person, place, and time. She appears well-developed and well-nourished.  Cardiovascular: Normal rate.  Pulmonary/Chest: Effort normal.  Musculoskeletal: Normal range of motion.  Neurological: She is oriented to person, place, and time.  Skin: Skin is warm and dry.  Psychiatric: She has a normal mood and affect. Her behavior is  normal.  Vitals reviewed.   RECENT LABS AND TESTS: BMET    Component Value Date/Time   NA 140 06/05/2017 1500   K 4.2 06/05/2017 1500   CL 104 06/05/2017 1500   CO2 21 06/05/2017 1500   GLUCOSE 114 (H) 06/05/2017 1500   GLUCOSE 103 (H) 07/24/2016 1121   BUN 10 06/05/2017 1500   CREATININE 0.99 06/05/2017 1500   CREATININE 1.10 07/24/2016 1121   CALCIUM 9.6 06/05/2017 1500   GFRNONAA 67 06/05/2017 1500   GFRAA 77 06/05/2017 1500   Lab Results  Component Value Date   HGBA1C 6.1 (H) 06/05/2017   HGBA1C 6.1 (H) 03/06/2017   HGBA1C 6.0 (H) 08/24/2016   Lab Results  Component Value Date   INSULIN 27.3 (H) 03/06/2017   INSULIN 31.7 (H) 08/24/2016   CBC    Component Value Date/Time   WBC 7.5 08/24/2016 1018   WBC 6.1 10/12/2015 1445   RBC 4.77 08/24/2016 1018   RBC 4.85 10/12/2015 1445   HGB 13.5 08/24/2016 1018   HCT 41.2 08/24/2016 1018   PLT 276 08/24/2016 1018   MCV 86 08/24/2016 1018   MCH 28.3 08/24/2016 1018   MCH 28.2 10/12/2015 1445   MCHC 32.8 08/24/2016 1018   MCHC 33.6 10/12/2015 1445   RDW 14.6 08/24/2016 1018   LYMPHSABS 3.5 (H) 08/24/2016 1018   MONOABS 610 10/12/2015 1445   EOSABS 0.2 08/24/2016 1018   BASOSABS 0.0 08/24/2016 1018   Iron/TIBC/Ferritin/ %Sat No results found for: IRON, TIBC, FERRITIN, IRONPCTSAT Lipid Panel     Component Value Date/Time   CHOL 160 06/05/2017 1500   TRIG 115 06/05/2017 1500   HDL 41 06/05/2017 1500   CHOLHDL 3.9 06/05/2017 1500   CHOLHDL 3.6 10/12/2015 1445   VLDL 19 10/12/2015 1445   LDLCALC 96 06/05/2017 1500   Hepatic Function Panel     Component Value Date/Time   PROT 6.9 06/05/2017 1500   ALBUMIN 3.8 06/05/2017 1500   AST 12 06/05/2017 1500   ALT 6 06/05/2017 1500   ALKPHOS 109 06/05/2017 1500   BILITOT 0.3 06/05/2017 1500      Component Value Date/Time   TSH 1.940 08/24/2016 1018   TSH 3.92 07/24/2016 1121   TSH 2.79 10/12/2015 1445    ASSESSMENT AND PLAN: Prediabetes  Class 1 obesity  with serious comorbidity and body mass index (BMI) of 33.0 to 33.9 in adult, unspecified obesity type  PLAN:  Pre-Diabetes Sarah Duncan will continue to work on weight loss, diet, exercise, and decreasing simple carbohydrates in her diet to help decrease the risk of diabetes. Sarah dicussed metformin including benefits and risks. She was informed that eating too many simple carbohydrates or too many calories at one sitting increases the likelihood of GI side effects. Sarah Duncan declined  metformin for now and a prescription was not written today. Sarah Duncan agreed to follow up with [place] as directed to monitor her progress.  Sarah Duncan.  Obesity Sarah Duncan is currently in the action stage of change. As such, her goal is to continue with weight loss efforts She has agreed to follow the Category 2 plan Sarah Duncan has been instructed to work up to a goal of 150 minutes of combined cardio and strengthening exercise per week for weight loss and overall health benefits. Sarah discussed the following Behavioral Modification Strategies today: increasing lean protein intake and work on meal planning and easy cooking plans   Sarah Duncan has agreed to follow up with our clinic in 2 weeks. She was informed of the importance of frequent follow up visits to maximize her success with intensive lifestyle modifications for her multiple health conditions.   OBESITY BEHAVIORAL INTERVENTION VISIT  Today's visit was # 16 out of 22.  Starting weight: 224 lbs Starting date: 08/24/16 Today's weight : 214 lbs Today's date: 08/29/2017 Total lbs lost to date: 10 (Patients must lose 7 lbs in the first 6 months to continue with counseling)   ASK: Sarah discussed the diagnosis of obesity with Rebecca Eaton today and Serayah agreed to give [place] permission to discuss obesity behavioral modification therapy today.  ASSESS: Shealee has the diagnosis of obesity and her BMI today is  33.51 Anam is in the action stage of change   ADVISE: [place] was educated on the multiple health risks of obesity as well as the benefit of weight loss to improve her health. She was advised of the need for long term treatment and the importance of lifestyle modifications.  AGREE: Multiple dietary modification options and treatment options were discussed and  Madyn agreed to the above obesity treatment plan.   Wilhemena Durie, am acting as transcriptionist for Lacy Duverney, PA-C I, Lacy Duverney [facility], have reviewed this Duncan and agree with its content

## 2017-09-03 ENCOUNTER — Ambulatory Visit (INDEPENDENT_AMBULATORY_CARE_PROVIDER_SITE_OTHER): Payer: 59

## 2017-09-03 ENCOUNTER — Encounter (INDEPENDENT_AMBULATORY_CARE_PROVIDER_SITE_OTHER): Payer: Self-pay | Admitting: Physical Medicine and Rehabilitation

## 2017-09-03 ENCOUNTER — Ambulatory Visit (INDEPENDENT_AMBULATORY_CARE_PROVIDER_SITE_OTHER): Payer: 59 | Admitting: Physical Medicine and Rehabilitation

## 2017-09-03 VITALS — BP 126/93 | HR 61 | Temp 98.1°F

## 2017-09-03 DIAGNOSIS — M47816 Spondylosis without myelopathy or radiculopathy, lumbar region: Secondary | ICD-10-CM | POA: Diagnosis not present

## 2017-09-03 MED ORDER — METHYLPREDNISOLONE ACETATE 80 MG/ML IJ SUSP
80.0000 mg | Freq: Once | INTRAMUSCULAR | Status: AC
  Administered 2017-09-03: 80 mg

## 2017-09-03 MED ORDER — BUPIVACAINE HCL 0.5 % IJ SOLN
3.0000 mL | Freq: Once | INTRAMUSCULAR | Status: AC
  Administered 2017-09-03: 3 mL

## 2017-09-03 NOTE — Patient Instructions (Signed)

## 2017-09-03 NOTE — Progress Notes (Signed)
 .  Numeric Pain Rating Scale and Functional Assessment Average Pain 7   In the last MONTH (on 0-10 scale) has pain interfered with the following?  1. General activity like being  able to carry out your everyday physical activities such as walking, climbing stairs, carrying groceries, or moving a chair?  Rating(0)   +Driver, +BT, -Dye Allergies.  

## 2017-09-04 ENCOUNTER — Telehealth (INDEPENDENT_AMBULATORY_CARE_PROVIDER_SITE_OTHER): Payer: Self-pay | Admitting: *Deleted

## 2017-09-05 NOTE — Telephone Encounter (Signed)
Called pt and left vm #1 to advise from Dr. Ernestina Patches.

## 2017-09-05 NOTE — Telephone Encounter (Signed)
Was not ESI but was diagnostic medial branch/facet blocks - she can try epidural injection or f/up with Dr. Sharrie Rothman

## 2017-09-06 NOTE — Telephone Encounter (Signed)
I will send in on Monday

## 2017-09-06 NOTE — Telephone Encounter (Signed)
Pt wants to go ahead and move forward with lumbar esi appt is 09/24/17. Pt also wants to have valium before esi. Pharmacy is [facility]. Please Advise.

## 2017-09-07 ENCOUNTER — Encounter: Payer: Self-pay | Admitting: Physician Assistant

## 2017-09-07 ENCOUNTER — Ambulatory Visit: Payer: 59 | Admitting: Physician Assistant

## 2017-09-07 VITALS — BP 126/88 | HR 72 | Temp 98.7°F | Resp 16 | Ht 67.0 in | Wt 212.6 lb

## 2017-09-07 DIAGNOSIS — E7849 Other hyperlipidemia: Secondary | ICD-10-CM

## 2017-09-07 DIAGNOSIS — R7303 Prediabetes: Secondary | ICD-10-CM

## 2017-09-07 DIAGNOSIS — F3289 Other specified depressive episodes: Secondary | ICD-10-CM | POA: Diagnosis not present

## 2017-09-07 DIAGNOSIS — F411 Generalized anxiety disorder: Secondary | ICD-10-CM | POA: Insufficient documentation

## 2017-09-07 MED ORDER — DULOXETINE HCL 60 MG PO CPEP: 60.0000 mg | ORAL_CAPSULE | Freq: Every day | ORAL | 3 refills | Status: DC

## 2017-09-07 MED FILL — DULoxetine HCL 60 MG CPEP: 60 | 90 days supply | Qty: 90 | Fill #0

## 2017-09-07 NOTE — Progress Notes (Signed)
Subjective:    Patient ID: Sarah Duncan, female    DOB: February 27, 1966, 52 y.o.   MRN: [id number] Chief Complaint  Patient presents with  . Diabetes    follow up  . Depression    follow up     HPI  Pre-diabetes: Diet: wellness diet, high protein, low carbs. Reports no changes in weight.  Exercise: no exercise at this time, will try water aerobics with wife in the next few weeks.  Oral steroids few weeks ago for back pain as well steroid injections 4 days ago. Will return on May 6th for follow up and possible change in treatment. Trying to avoid surgery.  Anxiety/Depression  Still reports anxiety about things she feels she shouldn't be anxious about.  Reports over thinking, trouble sleeping (melatonin, asleep 3-4am every night) Dog recently had an unexpected death, 2024-04-19which has been contributing to more anxiety and depression. Cymbalta - still having problems with anxiety and depression. Feels some relief but not enough. Denies suicidal thoughts, thoughts of hurting self or others. Has recently had more heart palpitations lately. Occurs intermittently with some days being worse than other.  Review of Systems  Constitutional: Positive for activity change, appetite change, diaphoresis (night sweats- attributed to menopause) and fatigue.  HENT: Negative.   Eyes: Negative.   Respiratory: Negative.   Cardiovascular: Positive for palpitations. Negative for chest pain and leg swelling.  Gastrointestinal: Negative.   Endocrine: Positive for heat intolerance.  Genitourinary: Negative.   Musculoskeletal: Positive for back pain.  Skin: Negative.   Allergic/Immunologic: Negative.   Neurological: Positive for light-headedness. Negative for dizziness and headaches.  Hematological: Negative.   Psychiatric/Behavioral: The patient is nervous/anxious.     Patient Active Problem List   Diagnosis Date Noted  . Essential hypertension 07/31/2017  . Other hyperlipidemia  03/06/2017  . Class 2 obesity with serious comorbidity and body mass index (BMI) of 35.0 to 35.9 in adult 01/08/2017  . Other chronic pain 12/04/2016  . Prediabetes 10/18/2016  . Vitamin D deficiency 10/18/2016  . Depression 11/23/2015  . 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 Chronic lower back pain 06/18/2015    Past Medical History:  Diagnosis Date  . Anemia   . Anxiety   . Arthritis   . Constipation   . Degeneration of cervical intervertebral disc   . Depression   . Fibroids   . 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 Lactose intolerance   . Palpitations   . Shortness of breath   . 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 Authorizing Provider  DULoxetine (CYMBALTA) 30 MG capsule Take 1 capsule (30 mg total) by mouth daily. 07/16/17   Waldon Merl, PA-C  MELATONIN PO Take by mouth daily.    [provider]  methocarbamol (ROBAXIN) 500 MG tablet Take 500 mg by mouth 4 (four) times daily.    [provider]  methylPREDNISolone (MEDROL DOSEPAK) 4 MG TBPK tablet  08/02/17   [provider]  oxyCODONE-acetaminophen (PERCOCET/ROXICET) 5-325 MG tablet Take 1 tablet by mouth every 4 (four) hours as needed for severe pain. 08/01/17   Robyn Haber, MD  promethazine (PHENERGAN) 12.5 MG tablet Take 1-2 tablets (12.5-25 mg total) by mouth every 8 (eight) hours as needed for nausea or vomiting. 04/17/17   Harrison Mons, PA-C  tiZANidine (ZANAFLEX) 4 MG tablet Take 0.5-1 tablets (2-4 mg total) by mouth every  6 (six) hours as needed for muscle spasms. Not more than 3 times daily. 06/05/17   Harrison Mons, PA-C    No Known Allergies      Objective:   Physical Exam  Constitutional: She is oriented to person, place, and time. She appears well-developed and well-nourished. No distress.  HENT:  Head: Normocephalic and atraumatic.  Nose: Nose normal.  Eyes: Pupils  are equal, round, and reactive to light. Conjunctivae and EOM are normal. Right eye exhibits no discharge. Left eye exhibits no discharge.  Neck: Normal range of motion. Neck supple. No tracheal deviation present. No thyromegaly present.  Cardiovascular: Normal rate, regular rhythm and intact distal pulses. Exam reveals no gallop and no friction rub.  No murmur heard. Pulses:      Radial pulses are 2+ on the right side, and 2+ on the left side.  Pulmonary/Chest: Effort normal and breath sounds normal. No respiratory distress. She has no wheezes. She has no rales.  Musculoskeletal: Normal range of motion. She exhibits no edema.  Neurological: She is alert and oriented to person, place, and time. She has normal reflexes.  Skin: Skin is warm and dry. She is not diaphoretic. No erythema.  Psychiatric: She has a normal mood and affect. Her behavior is normal.     Depression screen [facility] 2/9 09/07/2017 06/05/2017 04/17/2017 02/28/2017 01/08/2017  Decreased Interest 2 1 2  0 2  Down, Depressed, Hopeless 2 1 2  0 1  PHQ - 2 Score 4 2 4  0 3  Altered sleeping 3 2 1  - 3  Tired, decreased energy 2 1 2  - 3  Change in appetite 2 1 2  - 1  Feeling bad or failure about yourself  2 1 2  - 1  Trouble concentrating 2 1 2  - 1  Moving slowly or fidgety/restless 3 2 1  - 3  Suicidal thoughts 0 0 0 - 0  PHQ-9 Score 18 10 14  - 15  Difficult doing work/chores Somewhat difficult Somewhat difficult Somewhat difficult - Somewhat difficult  Some recent data might be hidden   GAD 7 : Generalized Anxiety Score 09/07/2017  Nervous, Anxious, on Edge 2  Control/stop worrying 3  Worry too much - different things 3  Trouble relaxing 3  Restless 3  Easily annoyed or irritable 3  Afraid - awful might happen 3  Total GAD 7 Score 20  Anxiety Difficulty Somewhat difficult    Wt Readings from Last 3 Encounters:  09/07/17 212 lb 9.6 oz (96.4 kg)  08/29/17 214 lb (97.1 kg)  08/14/17 215 lb (97.5 kg)       Assessment &  Plan:  1. Prediabetes Due to recent oral steroids and injections, likely high A1c, await labs and determine if treatment other than lifestyle necessary. - Hemoglobin A1c  2. Other hyperlipidemia Review labs and f/u, adjust meds. - Lipid panel - Comprehensive metabolic panel  3. Generalized anxiety disorder Increase dose of duloxetine to 60mg  qDAY for anxiety and depressive symptoms.   - DULoxetine (CYMBALTA) 60 MG capsule; Take 1 capsule (60 mg total) by mouth daily.  Dispense: 90 capsule; Refill: 3 4. Other depression See above  - DULoxetine (CYMBALTA) 60 MG capsule; Take 1 capsule (60 mg total) by mouth daily.  Dispense: 90 capsule; Refill: 3

## 2017-09-07 NOTE — Progress Notes (Signed)
Patient ID: Sarah Duncan, female    DOB: [birth year], 52 y.o.   MRN: [id number]  PCP: Harrison Mons, PA-C  Chief Complaint  Patient presents with  . Diabetes    follow up  . Depression    follow up     Subjective:   Presents for evaluation of prediabetes and anxiety and depression.  She is accompanied by her wife, Sarah Duncan, who is also being seen today.  [facility]  She is following the diet recommended but is not able to exercise due to her to her chronic back pain.  She has had no recent change in her weight and finds this quite frustrating.  Undergoing treatment for her chronic back pain. Has recently had oral steroids and injections recently, and as such anticipate elevated glucose.  Anxiety remains debilitating. Increased palpitations, some days better/worse. Their dog recently died, which has been very difficult. Both her daughter and step-daughter have recently had children.  Her daughter's child has a heart condition.  Her step-daughter's child was born 81 month early and spent the first month in the ICU. Needless to say there is been a lot of stress in her life. Cymbalta is helping a little bit, but not enough.   Review of Systems Constitutional: Positive for activity change, appetite change, diaphoresis (night sweats- attributed to menopause) and fatigue.  HENT: Negative.   Eyes: Negative.   Respiratory: Negative.   Cardiovascular: Positive for palpitations. Negative for chest pain and leg swelling.  Gastrointestinal: Negative.   Endocrine: Positive for heat intolerance.  Genitourinary: Negative.   Musculoskeletal: Positive for back pain.  Skin: Negative.   Allergic/Immunologic: Negative.   Neurological: Positive for light-headedness. Negative for dizziness and headaches.  Hematological: Negative.   Psychiatric/Behavioral: The patient is nervous/anxious.      Depression screen [facility] 2/9  09/07/2017 06/05/2017 04/17/2017 02/28/2017 01/08/2017  Decreased Interest 2 1 2  0 2  Down, Depressed, Hopeless 2 1 2  0 1  PHQ - 2 Score 4 2 4  0 3  Altered sleeping 3 2 1  - 3  Tired, decreased energy 2 1 2  - 3  Change in appetite 2 1 2  - 1  Feeling bad or failure about yourself  2 1 2  - 1  Trouble concentrating 2 1 2  - 1  Moving slowly or fidgety/restless 3 2 1  - 3  Suicidal thoughts 0 0 0 - 0  PHQ-9 Score 18 10 14  - 15  Difficult doing work/chores Somewhat difficult Somewhat difficult Somewhat difficult - Somewhat difficult  Some recent data might be hidden    GAD 7 : Generalized Anxiety Score 09/07/2017  Nervous, Anxious, on Edge 2  Control/stop worrying 3  Worry too much - different things 3  Trouble relaxing 3  Restless 3  Easily annoyed or irritable 3  Afraid - awful might happen 3  Total GAD 7 Score 20  Anxiety Difficulty Somewhat difficult     Patient Active Problem List   Diagnosis Date Noted  . Essential hypertension 07/31/2017  . Other hyperlipidemia 03/06/2017  . Class 2 obesity with serious comorbidity and body mass index (BMI) of 35.0 to 35.9 in adult 01/08/2017  . Other chronic pain 12/04/2016  . Prediabetes 10/18/2016  . Vitamin D deficiency 10/18/2016  . Depression 11/23/2015  . 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 Chronic lower back pain 06/18/2015     Prior to Admission medications   Medication Sig Start Date End Date Taking?  Authorizing Provider  DULoxetine (CYMBALTA) 30 MG capsule Take 1 capsule (30 mg total) by mouth daily. 07/16/17   Waldon Merl, PA-C  MELATONIN PO Take by mouth daily.    [provider]  methocarbamol (ROBAXIN) 500 MG tablet Take 500 mg by mouth 4 (four) times daily.    [provider]  methylPREDNISolone (MEDROL DOSEPAK) 4 MG TBPK tablet  08/02/17   [provider]  oxyCODONE-acetaminophen (PERCOCET/ROXICET) 5-325 MG tablet Take 1 tablet by mouth every 4 (four) hours as needed for severe pain. 08/01/17    Robyn Haber, MD  promethazine (PHENERGAN) 12.5 MG tablet Take 1-2 tablets (12.5-25 mg total) by mouth every 8 (eight) hours as needed for nausea or vomiting. 04/17/17   Harrison Mons, PA-C  tiZANidine (ZANAFLEX) 4 MG tablet Take 0.5-1 tablets (2-4 mg total) by mouth every 6 (six) hours as needed for muscle spasms. Not more than 3 times daily. 06/05/17   Harrison Mons, PA-C     No Known Allergies     Objective:  Physical Exam  Constitutional: She is oriented to person, place, and time. She appears well-developed and well-nourished. She is active and cooperative. No distress.  BP 126/88   Pulse 72   Temp 98.7 F (37.1 C)   Resp 16   Ht 5\' 7"  (1.702 m)   Wt 212 lb 9.6 oz (96.4 kg)   SpO2 99%   BMI 33.30 kg/m   HENT:  Head: Normocephalic and atraumatic.  Right Ear: Hearing normal.  Left Ear: Hearing normal.  Eyes: Conjunctivae are normal. No scleral icterus.  Neck: Normal range of motion. Neck supple. No thyromegaly present.  Cardiovascular: Normal rate, regular rhythm and normal heart sounds.  Pulses:      Radial pulses are 2+ on the right side, and 2+ on the left side.  Pulmonary/Chest: Effort normal and breath sounds normal.  Lymphadenopathy:       Head (right side): No tonsillar, no preauricular, no posterior auricular and no occipital adenopathy present.       Head (left side): No tonsillar, no preauricular, no posterior auricular and no occipital adenopathy present.    She has no cervical adenopathy.       Right: No supraclavicular adenopathy present.       Left: No supraclavicular adenopathy present.  Neurological: She is alert and oriented to person, place, and time. No sensory deficit.  Skin: Skin is warm, dry and intact. No rash noted. No cyanosis or erythema. Nails show no clubbing.  Psychiatric: Her speech is normal and behavior is normal. Judgment and thought content normal. Her mood appears anxious. Her affect is not angry, not blunt, not labile and not  inappropriate. Cognition and memory are normal. She exhibits a depressed mood.           Assessment & Plan:   Problem List Items Addressed This Visit    Depression    Inadequate control, possibly due to the recent increase in stress.  Increase duloxetine from 30 mg to 60 mg.      Relevant Medications   DULoxetine (CYMBALTA) 60 MG capsule   Prediabetes - Primary    Continue efforts for healthy eating, working with medical weight management.  Encouraged her to start aquatic exercise.  Update A1c today.  Would consider the addition of metformin which may help with weight loss.      Relevant Orders   Hemoglobin A1c (Completed)   Other hyperlipidemia    Continue healthy eating, under the guidance of  medical weight management.  Encouraged aquatic exercise.  Await lipid profile.      Relevant Orders   Lipid panel (Completed)   Comprehensive metabolic panel (Completed)   Generalized anxiety disorder    Inadequate control on current regimen with duloxetine, perhaps due to recent life stressors.  Increase duloxetine from 30 mg to 60 mg.      Relevant Medications   DULoxetine (CYMBALTA) 60 MG capsule       Return in about 1 month (around 10/05/2017) for re-evalaution of anxiety and depression.   Fara Chute, PA-C [facility]

## 2017-09-07 NOTE — Patient Instructions (Addendum)
INCREASE the duloxetine (Cymbalta) from 30 mg to 60 mg. Start the water exercise.    IF you received an x-ray today, you will receive an invoice from [facility]. Please contact [facility] at [phone] with questions or concerns regarding your invoice.   IF you received labwork today, you will receive an invoice from [facility]. Please contact [facility] at [phone] with questions or concerns regarding your invoice.   Our billing staff will not be able to assist you with questions regarding bills from these companies.  You will be contacted with the lab results as soon as they are available. The fastest way to get your results is to activate your My Chart account. Instructions are located on the last page of this paperwork. If you have not heard from [place] regarding the results in 2 weeks, please contact this office.

## 2017-09-08 LAB — LIPID PANEL
CHOLESTEROL TOTAL: 170 mg/dL (ref 100–199)
Chol/HDL Ratio: 3.5 ratio (ref 0.0–4.4)
HDL: 49 mg/dL (ref >39)
LDL CALC: 103 mg/dL — AB (ref 0–99)
TRIGLYCERIDES: 91 mg/dL (ref 0–149)
VLDL Cholesterol Cal: 18 mg/dL (ref 5–40)

## 2017-09-08 LAB — HEMOGLOBIN A1C
Est. average glucose Bld gHb Est-mCnc: 128 mg/dL
Hgb A1c MFr Bld: 6.1 % — ABNORMAL HIGH (ref 4.8–5.6)

## 2017-09-08 LAB — COMPREHENSIVE METABOLIC PANEL
ALBUMIN: 4.2 g/dL (ref 3.5–5.5)
ALK PHOS: 118 IU/L — AB (ref 39–117)
ALT: 7 IU/L (ref 0–32)
AST: 15 IU/L (ref 0–40)
Albumin/Globulin Ratio: 1.3 (ref 1.2–2.2)
BILIRUBIN TOTAL: 0.3 mg/dL (ref 0.0–1.2)
BUN / CREAT RATIO: 13 (ref 9–23)
BUN: 12 mg/dL (ref 6–24)
CHLORIDE: 103 mmol/L (ref 96–106)
CO2: 26 mmol/L (ref 20–29)
CREATININE: 0.94 mg/dL (ref 0.57–1.00)
Calcium: 9.6 mg/dL (ref 8.7–10.2)
GFR calc Af Amer: 82 mL/min/{1.73_m2} (ref >59)
GFR calc non Af Amer: 71 mL/min/{1.73_m2} (ref >59)
GLOBULIN, TOTAL: 3.2 g/dL (ref 1.5–4.5)
GLUCOSE: 95 mg/dL (ref 65–99)
Potassium: 4.5 mmol/L (ref 3.5–5.2)
SODIUM: 142 mmol/L (ref 134–144)
Total Protein: 7.4 g/dL (ref 6.0–8.5)

## 2017-09-08 NOTE — Assessment & Plan Note (Signed)
Continue healthy eating, under the guidance of medical weight management.  Encouraged aquatic exercise.  Await lipid profile.

## 2017-09-08 NOTE — Assessment & Plan Note (Signed)
Inadequate control, possibly due to the recent increase in stress.  Increase duloxetine from 30 mg to 60 mg.

## 2017-09-08 NOTE — Assessment & Plan Note (Signed)
Continue efforts for healthy eating, working with medical weight management.  Encouraged her to start aquatic exercise.  Update A1c today.  Would consider the addition of metformin which may help with weight loss.

## 2017-09-08 NOTE — Assessment & Plan Note (Signed)
Inadequate control on current regimen with duloxetine, perhaps due to recent life stressors.  Increase duloxetine from 30 mg to 60 mg.

## 2017-09-12 ENCOUNTER — Encounter (INDEPENDENT_AMBULATORY_CARE_PROVIDER_SITE_OTHER): Payer: Self-pay

## 2017-09-12 ENCOUNTER — Ambulatory Visit (INDEPENDENT_AMBULATORY_CARE_PROVIDER_SITE_OTHER): Payer: 59 | Admitting: Physician Assistant

## 2017-09-12 NOTE — Progress Notes (Signed)
HERO MCCATHERN - 52 y.o. female MRN [id number]  Date of birth: [birth year]  Office Visit Note: Visit Date: 09/03/2017 PCP: Harrison Mons, PA-C Referred by: Harrison Mons, PA-C  Subjective: Chief Complaint  Patient presents with  . Middle Back - Pain   HPI: Sarah Duncan is a 52 year old female that comes in today at the request of Dr. Rodell Perna and Benjiman Core, PA-C for diagnostic facet joint/medial branch block of L4-5.  She has chronic worsening middle low back pain that is been going on for over 3 years.  She has worsening with standing but also prolonged sitting and going from sit to stand.  She denies any radicular leg pain.  She has MRI evidence of facet arthropathy severe at L4-5 with some lateral recess stenosis but no central canal stenosis.  She has no nerve compression.  She is failed conservative care up to this point.  We are going to complete the diagnostic block with looking forward at doing radiofrequency ablation if it successful on a double block paradigm.  If she does not get much relief I would look at a one-time epidural injection and follow-up with Dr. Lorin Mercy.   ROS Otherwise per HPI.  Assessment & Plan: Visit Diagnoses:  1. Spondylosis without myelopathy or radiculopathy, lumbar region     Plan: No additional findings.   Meds & Orders:  Meds ordered this encounter  Medications  . bupivacaine (MARCAINE) 0.5 % (with pres) injection 3 mL  . methylPREDNISolone acetate (DEPO-MEDROL) injection 80 mg    Orders Placed This Encounter  Procedures  . Facet Injection  . XR C-ARM NO REPORT    Follow-up: Return if symptoms worsen or fail to improve, for review Pain Diary.   Procedures: No procedures performed  Lumbar Diagnostic Facet Joint Nerve Block with Fluoroscopic Guidance   [name]      Date of Birth: 18-Sep-1965 MRN: [id number] PCP: Harrison Mons, PA-C      Visit Date: 09/03/2017   Universal Protocol:      Date/Time: 04/24/196:17 AM  Consent Given By: the patient  Position: PRONE  Additional Comments: Vital signs were monitored before and after the procedure. Patient was prepped and draped in the usual sterile fashion. The correct patient, procedure, and site was verified.   Injection Procedure Details:  Procedure Site One Meds Administered:  Meds ordered this encounter  Medications  . bupivacaine (MARCAINE) 0.5 % (with pres) injection 3 mL  . methylPREDNISolone acetate (DEPO-MEDROL) injection 80 mg     Laterality: Bilateral  Location/Site:  L4-L5  Needle size: 22 ga.  Needle type:spinal  Needle Placement: Oblique pedical  Findings:   -Comments: There was excellent flow of contrast along the articular pillars without intravascular flow.  Procedure Details: The fluoroscope beam is vertically oriented in AP and then obliqued 15 to 20 degrees to the ipsilateral side of the desired nerve to achieve the "Scotty dog" appearance.  The skin over the target area of the junction of the superior articulating process and the transverse process (sacral ala if blocking the L5 dorsal rami) was locally anesthetized with a 1 ml volume of 1% Lidocaine without Epinephrine.  The spinal needle was inserted and advanced in a trajectory view down to the target.   After contact with periosteum and negative aspirate for blood and CSF, correct placement without intravascular or epidural spread was confirmed by injecting 0.5 ml. of Isovue-250.  A spot radiograph was obtained of this image.    Next, a 0.5  ml. volume of the injectate described above was injected. The needle was then redirected to the other facet joint nerves mentioned above if needed.  Prior to the procedure, the patient was given a Pain Diary which was completed for baseline measurements.  After the procedure, the patient rated their pain every 30 minutes and will continue rating at this frequency for a total of 5 hours.  The patient  has been asked to complete the Diary and return to [place] by mail, fax or hand delivered as soon as possible.   Additional Comments:  The patient tolerated the procedure well Dressing: Band-Aid    Post-procedure details: Patient was observed during the procedure. Post-procedure instructions were reviewed.  Patient left the clinic in stable condition.    Clinical History: MRI LUMBAR SPINE WITHOUT CONTRAST  TECHNIQUE: Multiplanar, multisequence MR imaging of the lumbar spine was performed. No intravenous contrast was administered.  COMPARISON:  Lumbar spine MRI 04/30/2016  FINDINGS: Segmentation:  Standard.  Alignment:  Physiologic.  Vertebrae:  No fracture, evidence of discitis, or bone lesion.  Conus medullaris and cauda equina: Conus extends to the T12 level. Conus and cauda equina appear normal.  Paraspinal and other soft tissues: The visualized vascular, retroperitoneal and paraspinal structures are normal.  Disc levels:  The disc spaces from T10 to L3 are normal.  L3-L4: Small right subarticular disc protrusion and mild facet hypertrophy. Mild narrowing of the right lateral recess, unchanged. No spinal canal stenosis or neural foraminal stenosis.  L4-L5: Disc space narrowing severe right and moderate left facet hypertrophy, unchanged. No spinal canal stenosis. Narrowing of the right lateral recess is worsened. Moderate left neural foraminal stenosis is unchanged.  L5-S1: No disc herniation or stenosis.  IMPRESSION: 1. 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 2. Mild right L3-4 lateral recess narrowing.   Electronically Signed   By: Ulyses Jarred M.D.   On: 07/09/2017 04:23   She reports that she has quit smoking. Her smoking use included cigarettes. She quit after 1.00 year of use. She has never used smokeless tobacco.  Recent Labs    03/06/17 0905 06/05/17 1500  09/07/17 1132  HGBA1C 6.1* 6.1* 6.1*    Objective:  VS:  HT:    WT:   BMI:     BP:(!) 126/93  HR:61bpm  TEMP:98.1 F (36.7 C)(Oral)  RESP:100 % Physical Exam  Ortho Exam Imaging: No results found.  Past Medical/Family/Surgical/Social History: Medications & Allergies reviewed per EMR, new medications updated. Patient Active Problem List   Diagnosis Date Noted  . Generalized anxiety disorder 09/07/2017  . Essential hypertension 07/31/2017  . Other hyperlipidemia 03/06/2017  . Class 2 obesity with serious comorbidity and body mass index (BMI) of 35.0 to 35.9 in adult 01/08/2017  . Other chronic pain 12/04/2016  . Prediabetes 10/18/2016  . Vitamin D deficiency 10/18/2016  . Depression 11/23/2015  . 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 Chronic lower back pain 06/18/2015   Past Medical History:  Diagnosis Date  . Anemia   . Anxiety   . Arthritis   . Constipation   . Degeneration of cervical intervertebral disc   . Depression   . Fibroids   . 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 Lactose intolerance   . Palpitations   . Shortness of breath   . 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 Diabetes Mother   .  Hypertension Mother   . Diabetes Brother   . Hypertension Brother   . Cancer Father   . Endometriosis Daughter   . Diabetes Daughter   . HIV Son    Past Surgical History:  Procedure Laterality Date  . ABDOMINAL HYSTERECTOMY  2007  . CESAREAN SECTION  11/19/1989  . 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 SPINE SURGERY  07/13/2015   Lumbar - Dr. Christia Reading  . TOOTH EXTRACTION  04/16/2017  . TUBAL LIGATION  05/09/1993   Social History   Occupational History  . 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 Smoking status: Former Smoker    Years: 1.00    Types: Cigarettes  . Smokeless tobacco: Never Used  Substance and Sexual  Activity  . Alcohol use: Yes    Alcohol/week: 0.6 oz    Types: 1 Standard drinks or equivalent per week    Comment: social  . Drug use: No  . Sexual activity: Yes    Partners: Female

## 2017-09-12 NOTE — Procedures (Signed)
Lumbar Diagnostic Facet Joint Nerve Block with Fluoroscopic Guidance   [name]      Date of Birth: 18-Dec-1965 MRN: [id number] PCP: Harrison Mons, PA-C      Visit Date: 09/03/2017   Universal Protocol:    Date/Time: 04/24/196:17 AM  Consent Given By: the patient  Position: PRONE  Additional Comments: Vital signs were monitored before and after the procedure. Patient was prepped and draped in the usual sterile fashion. The correct patient, procedure, and site was verified.   Injection Procedure Details:  Procedure Site One Meds Administered:  Meds ordered this encounter  Medications  . bupivacaine (MARCAINE) 0.5 % (with pres) injection 3 mL  . methylPREDNISolone acetate (DEPO-MEDROL) injection 80 mg     Laterality: Bilateral  Location/Site:  L4-L5  Needle size: 22 ga.  Needle type:spinal  Needle Placement: Oblique pedical  Findings:   -Comments: There was excellent flow of contrast along the articular pillars without intravascular flow.  Procedure Details: The fluoroscope beam is vertically oriented in AP and then obliqued 15 to 20 degrees to the ipsilateral side of the desired nerve to achieve the "Scotty dog" appearance.  The skin over the target area of the junction of the superior articulating process and the transverse process (sacral ala if blocking the L5 dorsal rami) was locally anesthetized with a 1 ml volume of 1% Lidocaine without Epinephrine.  The spinal needle was inserted and advanced in a trajectory view down to the target.   After contact with periosteum and negative aspirate for blood and CSF, correct placement without intravascular or epidural spread was confirmed by injecting 0.5 ml. of Isovue-250.  A spot radiograph was obtained of this image.    Next, a 0.5 ml. volume of the injectate described above was injected. The needle was then redirected to the other facet joint nerves mentioned above if needed.  Prior to the  procedure, the patient was given a Pain Diary which was completed for baseline measurements.  After the procedure, the patient rated their pain every 30 minutes and will continue rating at this frequency for a total of 5 hours.  The patient has been asked to complete the Diary and return to [place] by mail, fax or hand delivered as soon as possible.   Additional Comments:  The patient tolerated the procedure well Dressing: Band-Aid    Post-procedure details: Patient was observed during the procedure. Post-procedure instructions were reviewed.  Patient left the clinic in stable condition.

## 2017-09-14 ENCOUNTER — Other Ambulatory Visit (INDEPENDENT_AMBULATORY_CARE_PROVIDER_SITE_OTHER): Payer: Self-pay | Admitting: Physical Medicine and Rehabilitation

## 2017-09-14 MED ORDER — DIAZEPAM 5 MG PO TABS: ORAL_TABLET | ORAL | 0 refills | Status: DC

## 2017-09-14 MED FILL — diazePAM 5 MG TABS: 5 | 1 days supply | Qty: 2 | Fill #0

## 2017-09-24 ENCOUNTER — Ambulatory Visit (INDEPENDENT_AMBULATORY_CARE_PROVIDER_SITE_OTHER): Payer: 59

## 2017-09-24 ENCOUNTER — Ambulatory Visit (INDEPENDENT_AMBULATORY_CARE_PROVIDER_SITE_OTHER): Payer: 59 | Admitting: Physical Medicine and Rehabilitation

## 2017-09-24 ENCOUNTER — Encounter (INDEPENDENT_AMBULATORY_CARE_PROVIDER_SITE_OTHER): Payer: Self-pay | Admitting: Physical Medicine and Rehabilitation

## 2017-09-24 VITALS — BP 142/81

## 2017-09-24 DIAGNOSIS — M5416 Radiculopathy, lumbar region: Secondary | ICD-10-CM | POA: Diagnosis not present

## 2017-09-24 MED ORDER — METHYLPREDNISOLONE ACETATE 80 MG/ML IJ SUSP
80.0000 mg | Freq: Once | INTRAMUSCULAR | Status: AC
  Administered 2017-09-24: 80 mg

## 2017-09-24 NOTE — Progress Notes (Signed)
Sarah Duncan - 52 y.o. female MRN [id number]  Date of birth: [birth year]  Office Visit Note: Visit Date: 09/24/2017 PCP: Harrison Mons, PA-C Referred by: Harrison Mons, PA-C  Subjective: Chief Complaint  Patient presents with  . Middle Back - Pain  . Lower Back - Pain   HPI: Ms. Sarah Duncan is a 52 year old female who comes in today for lumbar epidural injection.  She had prior facet joint blocks which were not effectual at all not even during the anesthetic phase.  MRI shows significant facet arthropathy at L4-5 bilaterally with right lateral recess narrowing.  Small subarticular disc herniation at L3-4 more on the right.  The fact that she did not get much relief with the facet joint block we are going to complete an epidural injection.  We did make her an appointment to follow-up with Dr. Lorin Mercy.   ROS Otherwise per HPI.  Assessment & Plan: Visit Diagnoses:  1. Lumbar radiculopathy     Plan: No additional findings.   Meds & Orders:  Meds ordered this encounter  Medications  . methylPREDNISolone acetate (DEPO-MEDROL) injection 80 mg    Orders Placed This Encounter  Procedures  . XR C-ARM NO REPORT  . Epidural Steroid injection    Follow-up: Return in about 3 weeks (around 10/15/2017) for Dr. Lorin Mercy.   Procedures: No procedures performed  Lumbar Epidural Steroid Injection - Interlaminar Approach with Fluoroscopic Guidance  [name]      Date of Birth: 10-Jan-1966 MRN: [id number] PCP: Harrison Mons, PA-C      Visit Date: 09/24/2017   Universal Protocol:     Consent Given By: the patient  Position: PRONE  Additional Comments: Vital signs were monitored before and after the procedure. Patient was prepped and draped in the usual sterile fashion. The correct patient, procedure, and site was verified.   Injection Procedure Details:  Procedure Site One Meds Administered:  Meds ordered this encounter  Medications  .  methylPREDNISolone acetate (DEPO-MEDROL) injection 80 mg     Laterality: Right  Location/Site:  L4-L5  Needle size: 20 G  Needle type: Tuohy  Needle Placement: Paramedian epidural  Findings:   -Comments: Excellent flow of contrast into the epidural space.  Procedure Details: Using a paramedian approach from the side mentioned above, the region overlying the inferior lamina was localized under fluoroscopic visualization and the soft tissues overlying this structure were infiltrated with 4 ml. of 1% Lidocaine without Epinephrine. The Tuohy needle was inserted into the epidural space using a paramedian approach.   The epidural space was localized using loss of resistance along with lateral and bi-planar fluoroscopic views.  After negative aspirate for air, blood, and CSF, a 2 ml. volume of Isovue-250 was injected into the epidural space and the flow of contrast was observed. Radiographs were obtained for documentation purposes.    The injectate was administered into the level noted above.   Additional Comments:  The patient tolerated the procedure well Dressing: Band-Aid    Post-procedure details: Patient was observed during the procedure. Post-procedure instructions were reviewed.  Patient left the clinic in stable condition.   Clinical History: MRI LUMBAR SPINE WITHOUT CONTRAST  TECHNIQUE: Multiplanar, multisequence MR imaging of the lumbar spine was performed. No intravenous contrast was administered.  COMPARISON:  Lumbar spine MRI 04/30/2016  FINDINGS: Segmentation:  Standard.  Alignment:  Physiologic.  Vertebrae:  No fracture, evidence of discitis, or bone lesion.  Conus medullaris and cauda equina: Conus extends to the T12 level. Conus and  cauda equina appear normal.  Paraspinal and other soft tissues: The visualized vascular, retroperitoneal and paraspinal structures are normal.  Disc levels:  The disc spaces from T10 to L3 are  normal.  L3-L4: Small right subarticular disc protrusion and mild facet hypertrophy. Mild narrowing of the right lateral recess, unchanged. No spinal canal stenosis or neural foraminal stenosis.  L4-L5: Disc space narrowing severe right and moderate left facet hypertrophy, unchanged. No spinal canal stenosis. Narrowing of the right lateral recess is worsened. Moderate left neural foraminal stenosis is unchanged.  L5-S1: No disc herniation or stenosis.  IMPRESSION: 1. 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 2. Mild right L3-4 lateral recess narrowing.   Electronically Signed   By: Ulyses Jarred M.D.   On: 07/09/2017 04:23   She reports that she has quit smoking. Her smoking use included cigarettes. She quit after 1.00 year of use. She has never used smokeless tobacco.  Recent Labs    03/06/17 0905 06/05/17 1500 09/07/17 1132  HGBA1C 6.1* 6.1* 6.1*    Objective:  VS:  HT:    WT:   BMI:     BP:(!) 142/81  HR: bpm  TEMP: ( )  RESP:  Physical Exam  Musculoskeletal:   patient ambulates without aid she does have pain going from sit to stand and good distal strength.    Ortho Exam Imaging: Xr C-arm No Report  Result Date: 09/24/2017 Please see Notes or Procedures tab for imaging impression.   Past Medical/Family/Surgical/Social History: Medications & Allergies reviewed per EMR, new medications updated. Patient Active Problem List   Diagnosis Date Noted  . Generalized anxiety disorder 09/07/2017  . Essential hypertension 07/31/2017  . Other hyperlipidemia 03/06/2017  . Class 2 obesity with serious comorbidity and body mass index (BMI) of 35.0 to 35.9 in adult 01/08/2017  . Other chronic pain 12/04/2016  . Prediabetes 10/18/2016  . Vitamin D deficiency 10/18/2016  . Depression 11/23/2015  . 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 Chronic lower back pain 06/18/2015   Past Medical History:  Diagnosis  Date  . Anemia   . Anxiety   . Arthritis   . Constipation   . Degeneration of cervical intervertebral disc   . Depression   . Fibroids   . 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 Lactose intolerance   . Palpitations   . Shortness of breath   . 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 Diabetes Mother   . Hypertension Mother   . Diabetes Brother   . Hypertension Brother   . Cancer Father   . Endometriosis Daughter   . Diabetes Daughter   . HIV Son    Past Surgical History:  Procedure Laterality Date  . ABDOMINAL HYSTERECTOMY  2007  . CESAREAN SECTION  11/19/1989  . 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 SPINE SURGERY  07/13/2015   Lumbar - Dr. Christia Reading  . TOOTH EXTRACTION  04/16/2017  . TUBAL LIGATION  05/09/1993   Social History   Occupational History  . 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 Smoking status: Former Smoker    Years: 1.00    Types: Cigarettes  . Smokeless tobacco: Never Used  Substance and Sexual Activity  . Alcohol use: Yes    Alcohol/week: 0.6 oz  Types: 1 Standard drinks or equivalent per week    Comment: social  . Drug use: No  . Sexual activity: Yes    Partners: Female

## 2017-09-24 NOTE — Patient Instructions (Signed)

## 2017-09-24 NOTE — Progress Notes (Signed)
 .  Numeric Pain Rating Scale and Functional Assessment Average Pain 7   In the last MONTH (on 0-10 scale) has pain interfered with the following?  1. General activity like being  able to carry out your everyday physical activities such as walking, climbing stairs, carrying groceries, or moving a chair?  Rating(5)   +Driver, -BT, -Dye Allergies.  

## 2017-09-25 NOTE — Procedures (Signed)
Lumbar Epidural Steroid Injection - Interlaminar Approach with Fluoroscopic Guidance  [name]      Date of Birth: [birth year] MRN: [id number] PCP: Harrison Mons, PA-C      Visit Date: 09/24/2017   Universal Protocol:     Consent Given By: the patient  Position: PRONE  Additional Comments: Vital signs were monitored before and after the procedure. Patient was prepped and draped in the usual sterile fashion. The correct patient, procedure, and site was verified.   Injection Procedure Details:  Procedure Site One Meds Administered:  Meds ordered this encounter  Medications  . methylPREDNISolone acetate (DEPO-MEDROL) injection 80 mg     Laterality: Right  Location/Site:  L4-L5  Needle size: 20 G  Needle type: Tuohy  Needle Placement: Paramedian epidural  Findings:   -Comments: Excellent flow of contrast into the epidural space.  Procedure Details: Using a paramedian approach from the side mentioned above, the region overlying the inferior lamina was localized under fluoroscopic visualization and the soft tissues overlying this structure were infiltrated with 4 ml. of 1% Lidocaine without Epinephrine. The Tuohy needle was inserted into the epidural space using a paramedian approach.   The epidural space was localized using loss of resistance along with lateral and bi-planar fluoroscopic views.  After negative aspirate for air, blood, and CSF, a 2 ml. volume of Isovue-250 was injected into the epidural space and the flow of contrast was observed. Radiographs were obtained for documentation purposes.    The injectate was administered into the level noted above.   Additional Comments:  The patient tolerated the procedure well Dressing: Band-Aid    Post-procedure details: Patient was observed during the procedure. Post-procedure instructions were reviewed.  Patient left the clinic in stable condition.

## 2017-10-08 ENCOUNTER — Ambulatory Visit (INDEPENDENT_AMBULATORY_CARE_PROVIDER_SITE_OTHER): Payer: 59 | Admitting: Physician Assistant

## 2017-10-08 ENCOUNTER — Encounter: Payer: Self-pay | Admitting: Physician Assistant

## 2017-10-08 ENCOUNTER — Other Ambulatory Visit: Payer: Self-pay

## 2017-10-08 VITALS — BP 120/72 | HR 84 | Temp 98.3°F | Resp 16 | Ht 67.0 in | Wt 210.3 lb

## 2017-10-08 DIAGNOSIS — F411 Generalized anxiety disorder: Secondary | ICD-10-CM

## 2017-10-08 DIAGNOSIS — F3289 Other specified depressive episodes: Secondary | ICD-10-CM | POA: Diagnosis not present

## 2017-10-08 DIAGNOSIS — M545 Low back pain: Secondary | ICD-10-CM

## 2017-10-08 DIAGNOSIS — G8929 Other chronic pain: Secondary | ICD-10-CM

## 2017-10-08 MED ORDER — TIZANIDINE HCL 4 MG PO TABS: 2.0000 mg | ORAL_TABLET | Freq: Four times a day (QID) | ORAL | 3 refills | Status: AC | PRN

## 2017-10-08 MED FILL — tiZANidine HCL 4 MG TABS: 4 | 30 days supply | Qty: 90 | Fill #0

## 2017-10-08 NOTE — Patient Instructions (Addendum)
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  Keep up the great work!    IF you received an x-ray today, you will receive an invoice from [facility]. Please contact [facility] at [phone] with questions or concerns regarding your invoice.   IF you received labwork today, you will receive an invoice from [facility]. Please contact [facility] at [phone] with questions or concerns regarding your invoice.   Our billing staff will not be able to assist you with questions regarding bills from these companies.  You will be contacted with the lab results as soon as they are available. The fastest way to get your results is to activate your My Chart account. Instructions are located on the last page of this paperwork. If you have not heard from [place] regarding the results in 2 weeks, please contact this office.

## 2017-10-08 NOTE — Assessment & Plan Note (Signed)
Considerable improvement with increased duloxetine dose.  Continue.

## 2017-10-08 NOTE — Progress Notes (Signed)
Patient ID: Sarah Duncan, female    DOB: March 23, 1966, 52 y.o.   MRN: [id number]  PCP: Harrison Mons, PA-C  Chief Complaint  Patient presents with  . Depression    follow up     Subjective:   Presents for evaluation of mood.  She is accompanied by her wife, also being seen today.  Recall that she has long-standing depression and anxiety.  At her visit last month we increased duloxetine to 60 mg daily.. feels better with increased duloxetine dose.  Grandchild is home now, for 8 days. She is 73 months old. Has a trach, on a vent, and has a G-tube. Yesterday she had an event and cardiopulmonary arrest. The patient performed successful CPR, and the baby was crying and alert by the time EMS arrived.  Has had a series of back injections, without benefit. Will follow-up next month to discuss next steps.   Review of Systems  Depression screen [facility] 2/9 10/08/2017 09/07/2017 06/05/2017 04/17/2017 02/28/2017  Decreased Interest 0 2 1 2  0  Down, Depressed, Hopeless 0 2 1 2  0  PHQ - 2 Score 0 4 2 4  0  Altered sleeping - 3 2 1  -  Tired, decreased energy - 2 1 2  -  Change in appetite - 2 1 2  -  Feeling bad or failure about yourself  - 2 1 2  -  Trouble concentrating - 2 1 2  -  Moving slowly or fidgety/restless - 3 2 1  -  Suicidal thoughts - 0 0 0 -  PHQ-9 Score - 18 10 14  -  Difficult doing work/chores - Somewhat difficult Somewhat difficult Somewhat difficult -  Some recent data might be hidden      Patient Active Problem List   Diagnosis Date Noted  . Generalized anxiety disorder 09/07/2017  . Essential hypertension 07/31/2017  . Other hyperlipidemia 03/06/2017  . Class 2 obesity with serious comorbidity and body mass index (BMI) of 35.0 to 35.9 in adult 01/08/2017  . Other chronic pain 12/04/2016  . Prediabetes 10/18/2016  . Vitamin D deficiency 10/18/2016  . Depression 11/23/2015  . 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 Chronic lower back pain 06/18/2015     Prior  to Admission medications   Medication Sig Start Date End Date Taking? Authorizing Provider  DULoxetine (CYMBALTA) 60 MG capsule Take 1 capsule (60 mg total) by mouth daily. 09/07/17  Yes Lorisa Scheid, PA-C  MELATONIN PO Take by mouth daily.   Yes [provider]  promethazine (PHENERGAN) 12.5 MG tablet Take 1-2 tablets (12.5-25 mg total) by mouth every 8 (eight) hours as needed for nausea or vomiting. 04/17/17  Yes Tylisha Danis, PA-C  tiZANidine (ZANAFLEX) 4 MG tablet Take 0.5-1 tablets (2-4 mg total) by mouth every 6 (six) hours as needed for muscle spasms. Not more than 3 times daily. 06/05/17  Yes Oluwadarasimi Favor, PA-C     No Known Allergies     Objective:  Physical Exam  Constitutional: She is oriented to person, place, and time. She appears well-developed and well-nourished. She is active and cooperative. No distress.  BP 120/72   Pulse 84   Temp 98.3 F (36.8 C)   Resp 16   Ht 5\' 7"  (1.702 m)   Wt 210 lb 4.8 oz (95.4 kg)   SpO2 98%   BMI 32.94 kg/m    Eyes: Conjunctivae are normal.  Pulmonary/Chest: Effort normal.  Neurological: She is alert and oriented to person, place, and time.  Psychiatric: She has a  normal mood and affect. Her speech is normal and behavior is normal.       Wt Readings from Last 3 Encounters:  10/08/17 210 lb 4.8 oz (95.4 kg)  09/07/17 212 lb 9.6 oz (96.4 kg)  08/29/17 214 lb (97.1 kg)       Assessment & Plan:   Problem List Items Addressed This Visit    Chronic lower back pain    Continue follow-up with specialty care.  Refill tizanidine.      Relevant Medications   tiZANidine (ZANAFLEX) 4 MG tablet   Depression - Primary    Considerable improvement with increased duloxetine dose.  Continue.      Generalized anxiety disorder       Return in about 8 weeks (around 12/03/2017) for re-evaluation of mood.   Fara Chute, PA-C [facility]

## 2017-10-08 NOTE — Assessment & Plan Note (Signed)
Continue follow-up with specialty care.  Refill tizanidine.

## 2017-10-11 ENCOUNTER — Encounter: Payer: Self-pay | Admitting: Family Medicine

## 2017-10-31 ENCOUNTER — Encounter (INDEPENDENT_AMBULATORY_CARE_PROVIDER_SITE_OTHER): Payer: Self-pay | Admitting: Surgery

## 2017-10-31 ENCOUNTER — Ambulatory Visit (INDEPENDENT_AMBULATORY_CARE_PROVIDER_SITE_OTHER): Payer: 59 | Admitting: Surgery

## 2017-10-31 DIAGNOSIS — M533 Sacrococcygeal disorders, not elsewhere classified: Secondary | ICD-10-CM

## 2017-10-31 DIAGNOSIS — G8929 Other chronic pain: Secondary | ICD-10-CM | POA: Diagnosis not present

## 2017-10-31 NOTE — Progress Notes (Signed)
Office Visit Note   [name]           Date of Birth: [birth year]           MRN: [id number] Visit Date: 10/31/2017              Requested by: Harrison Mons, PA-C [address] [place],  [id number] PCP: Harrison Mons, PA-C   Assessment & Plan: Visit Diagnoses:  1. Chronic right SI joint pain     Plan: At this point recommend diagnostic/therapeutic right SI joint injection with Dr. Ernestina Patches.  Advised patient to pay close attention to how she feels immediately after the injection.  If she does get good relief with that injection we did discuss possibly trying radio frequency ablation.  I will have her follow-up with Dr. Louanne Skye 1 week after the injection to check her response andto discuss further treatment options.  Follow-Up Instructions: Return in about 3 weeks (around 11/21/2017) for with Dr Louanne Skye for f/u after right SI injection.   Orders:  Orders Placed This Encounter  Procedures  . Ambulatory referral to Physical Medicine Rehab   No orders of the defined types were placed in this encounter.     Procedures: No procedures performed   Clinical Data: No additional findings.   Subjective: No chief complaint on file.   HPI Patient returns with complaints of right sided back pain.  I previously sent her for bilateral L4-5 facet injections with Dr. Ernestina Patches and patient states that she did not get any relief from those injections whatsoever.  Dr. Ernestina Patches then performed a right L4-5 ESI and patient states that she had about 40% improvement of her pain for a day or so.  She continues to complain of ongoing right-sided low back pain which she localizes more to the SI joint.  She had maybe one episode of pain into the right thigh but that did not last long.  No lower extremity numbness tingling weakness. Review of Systems  No current cardiac pulmonary GI GU issues Objective: Vital Signs: There were no vitals taken for this visit.  Physical Exam    Constitutional: She is oriented to person, place, and time. No distress.  HENT:  Head: Normocephalic and atraumatic.  Eyes: Pupils are equal, round, and reactive to light. EOM are normal.  Pulmonary/Chest: No respiratory distress.  Musculoskeletal:  Exam patient is markedly tender over the right SI joint.  Nontender over the left side.  No sciatic notch tenderness.  Negative logroll bilateral hips.  Negative straight leg raise.  No focal motor deficits.  Neurological: She is alert and oriented to person, place, and time.  Skin: Skin is warm and dry.    Ortho Exam  Specialty Comments:  No specialty comments available.  Imaging: No results found.   PMFS History: Patient Active Problem List   Diagnosis Date Noted  . Generalized anxiety disorder 09/07/2017  . Essential hypertension 07/31/2017  . Other hyperlipidemia 03/06/2017  . Class 2 obesity with serious comorbidity and body mass index (BMI) of 35.0 to 35.9 in adult 01/08/2017  . Other chronic pain 12/04/2016  . Prediabetes 10/18/2016  . Vitamin D deficiency 10/18/2016  . Depression 11/23/2015  . 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 Chronic lower back pain 06/18/2015   Past Medical History:  Diagnosis Date  . Anemia   . Anxiety   . Arthritis   . Constipation   . Degeneration of cervical intervertebral disc   . Depression   . Fibroids   .  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 Lactose intolerance   . Palpitations   . Shortness of breath   . 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 Diabetes Mother   . Hypertension Mother   . Diabetes Brother   . Hypertension Brother   . Cancer Father   . Endometriosis Daughter   . Diabetes Daughter   . HIV Son     Past Surgical History:  Procedure Laterality Date  . ABDOMINAL HYSTERECTOMY  2007  . CESAREAN SECTION  11/19/1989  . 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 SPINE SURGERY   07/13/2015   Lumbar - Dr. Christia Duncan  . TOOTH EXTRACTION  04/16/2017  . TUBAL LIGATION  05/09/1993   Social History   Occupational History  . 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 Smoking status: Former Smoker    Years: 1.00    Types: Cigarettes  . Smokeless tobacco: Never Used  Substance and Sexual Activity  . Alcohol use: Yes    Alcohol/week: 0.6 oz    Types: 1 Standard drinks or equivalent per week    Comment: social  . Drug use: No  . Sexual activity: Yes    Partners: Female

## 2017-11-29 ENCOUNTER — Ambulatory Visit (INDEPENDENT_AMBULATORY_CARE_PROVIDER_SITE_OTHER): Payer: 59 | Admitting: Physical Medicine and Rehabilitation

## 2017-11-29 ENCOUNTER — Ambulatory Visit (INDEPENDENT_AMBULATORY_CARE_PROVIDER_SITE_OTHER): Payer: Self-pay

## 2017-11-29 ENCOUNTER — Encounter (INDEPENDENT_AMBULATORY_CARE_PROVIDER_SITE_OTHER): Payer: Self-pay | Admitting: Physical Medicine and Rehabilitation

## 2017-11-29 ENCOUNTER — Encounter

## 2017-11-29 DIAGNOSIS — M461 Sacroiliitis, not elsewhere classified: Secondary | ICD-10-CM

## 2017-11-29 DIAGNOSIS — M5416 Radiculopathy, lumbar region: Secondary | ICD-10-CM | POA: Diagnosis not present

## 2017-11-29 MED ORDER — METHYLPREDNISOLONE ACETATE 80 MG/ML IJ SUSP
80.0000 mg | Freq: Once | INTRAMUSCULAR | Status: AC
Start: 2017-11-29 — End: 2017-11-29
  Administered 2017-11-29: 80 mg

## 2017-11-29 NOTE — Progress Notes (Signed)
 .  Numeric Pain Rating Scale and Functional Assessment Average Pain 7   In the last MONTH (on 0-10 scale) has pain interfered with the following?  1. General activity like being  able to carry out your everyday physical activities such as walking, climbing stairs, carrying groceries, or moving a chair?  Rating(6)   +Driver, -BT, -Dye Allergies.  

## 2017-11-29 NOTE — Patient Instructions (Signed)

## 2017-12-07 DIAGNOSIS — F411 Generalized anxiety disorder: Secondary | ICD-10-CM | POA: Diagnosis not present

## 2017-12-07 DIAGNOSIS — F329 Major depressive disorder, single episode, unspecified: Secondary | ICD-10-CM | POA: Diagnosis not present

## 2017-12-07 MED FILL — busPIRone HCL 15 MG TABS: 15 | 30 days supply | Qty: 60 | Fill #0

## 2017-12-12 ENCOUNTER — Encounter (INDEPENDENT_AMBULATORY_CARE_PROVIDER_SITE_OTHER): Payer: Self-pay | Admitting: Surgery

## 2017-12-12 ENCOUNTER — Ambulatory Visit (INDEPENDENT_AMBULATORY_CARE_PROVIDER_SITE_OTHER): Payer: 59 | Admitting: Surgery

## 2017-12-12 VITALS — BP 119/79 | HR 60 | Ht 67.0 in | Wt 210.0 lb

## 2017-12-12 DIAGNOSIS — M47816 Spondylosis without myelopathy or radiculopathy, lumbar region: Secondary | ICD-10-CM | POA: Diagnosis not present

## 2017-12-12 DIAGNOSIS — G8929 Other chronic pain: Secondary | ICD-10-CM | POA: Diagnosis not present

## 2017-12-12 DIAGNOSIS — M533 Sacrococcygeal disorders, not elsewhere classified: Secondary | ICD-10-CM

## 2017-12-12 NOTE — Progress Notes (Signed)
Office Visit Note   [name]           Date of Birth: Mar 24, 1966           MRN: [id number] Visit Date: 12/12/2017              Requested by: Harrison Mons, PA-C No address on file PCP: Harrison Mons, PA-C   Assessment & Plan: Visit Diagnoses:  1. Chronic right SI joint pain   2. Spondylosis without myelopathy or radiculopathy, lumbar region     Plan: At this point I recommend patient follow-up with Dr. Louanne Skye to discuss further treatment options.  Patient at this point states that back pain is worse than leg pain.  I think it would also still be beneficial to try right SI joint diagnostic/therapeutic injection but I will leave that up to Dr. Louanne Skye to see what he wants to do.  Follow-Up Instructions: Return in about 2 weeks (around 12/26/2017) for With Dr. Louanne Skye per [name] to discuss further treatment options for lumbar and SI joint pain.   Orders:  No orders of the defined types were placed in this encounter.  No orders of the defined types were placed in this encounter.     Procedures: No procedures performed   Clinical Data: No additional findings.   Subjective: Chief Complaint  Patient presents with  . Lower Back - Follow-up    S/p ESI 11/29/17 Ernestina Patches)    HPI 52 year old white female returns for follow-up visit for right-sided low back pain/SI joint pain.  When patient was last seen by me as scheduled diagnostic/therapeutic right SI joint injection with Dr. Ernestina Patches but at the time of that visit patient was complaining of left leg pain.  Lumbar ESI was performed on the left side.  Right SI joint injection was not done.  Patient stated that she began having fairly severe left leg pain 2 days before Dr. Pollie Friar injection.  Currently leg pain is resolved but patient is still complaining of a significant amount of discomfort in the central low back more around the right SI joint.  No complaints of lower extremity numbness tingling. Review of  Systems No current cardiac pulmonary GI GU issues  Objective: Vital Signs: BP 119/79 (BP Location: Right Arm, Patient Position: Sitting, Cuff Size: Large)   Pulse 60   Ht 5\' 7"  (1.702 m)   Wt 210 lb (95.3 kg)   BMI 32.89 kg/m   Physical Exam  Constitutional: She is oriented to person, place, and time. No distress.  HENT:  Head: Normocephalic and atraumatic.  Eyes: Pupils are equal, round, and reactive to light. EOM are normal.  Pulmonary/Chest: No respiratory distress.  Musculoskeletal:  Gait is normal.  Patient is markedly tender over the right SI joint.  Nontender on the left side.  Neurologically intact.  Neurological: She is alert and oriented to person, place, and time.  Skin: Skin is warm and dry.    Ortho Exam  Specialty Comments:  No specialty comments available.  Imaging: No results found.   PMFS History: Patient Active Problem List   Diagnosis Date Noted  . Generalized anxiety disorder 09/07/2017  . Essential hypertension 07/31/2017  . Other hyperlipidemia 03/06/2017  . Class 2 obesity with serious comorbidity and body mass index (BMI) of 35.0 to 35.9 in adult 01/08/2017  . Other chronic pain 12/04/2016  . Prediabetes 10/18/2016  . Vitamin D deficiency 10/18/2016  . Depression 11/23/2015  . 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 Chronic lower back pain  06/18/2015   Past Medical History:  Diagnosis Date  . Anemia   . Anxiety   . Arthritis   . Constipation   . Degeneration of cervical intervertebral disc   . Depression   . Fibroids   . 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 Lactose intolerance   . Palpitations   . Shortness of breath   . 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 Diabetes Mother   . Hypertension Mother   . Diabetes Brother   . Hypertension Brother   . Cancer Father   . Endometriosis Daughter   . Diabetes Daughter   . HIV Son     Past Surgical History:    Procedure Laterality Date  . ABDOMINAL HYSTERECTOMY  2007  . CESAREAN SECTION  11/19/1989  . 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 SPINE SURGERY  07/13/2015   Lumbar - Dr. Christia Reading  . TOOTH EXTRACTION  04/16/2017  . TUBAL LIGATION  05/09/1993   Social History   Occupational History  . 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 Smoking status: Former Smoker    Years: 1.00    Types: Cigarettes  . Smokeless tobacco: Never Used  Substance and Sexual Activity  . Alcohol use: Yes    Alcohol/week: 0.6 oz    Types: 1 Standard drinks or equivalent per week    Comment: social  . Drug use: No  . Sexual activity: Yes    Partners: Female

## 2017-12-13 NOTE — Procedures (Signed)
Lumbar Epidural Steroid Injection - Interlaminar Approach with Fluoroscopic Guidance  [name]      Date of Birth: May 14, 1966 MRN: [id number] PCP: Harrison Mons, PA-C      Visit Date: 11/29/2017   Universal Protocol:     Consent Given By: the patient  Position: PRONE  Additional Comments: Vital signs were monitored before and after the procedure. Patient was prepped and draped in the usual sterile fashion. The correct patient, procedure, and site was verified.   Injection Procedure Details:  Procedure Site One Meds Administered:  Meds ordered this encounter  Medications  . methylPREDNISolone acetate (DEPO-MEDROL) injection 80 mg     Laterality: Left  Location/Site:  L4-L5  Needle size: 20 G  Needle type: Tuohy  Needle Placement: Paramedian epidural  Findings:   -Comments: Excellent flow of contrast into the epidural space.  Procedure Details: Using a paramedian approach from the side mentioned above, the region overlying the inferior lamina was localized under fluoroscopic visualization and the soft tissues overlying this structure were infiltrated with 4 ml. of 1% Lidocaine without Epinephrine. The Tuohy needle was inserted into the epidural space using a paramedian approach.   The epidural space was localized using loss of resistance along with lateral and bi-planar fluoroscopic views.  After negative aspirate for air, blood, and CSF, a 2 ml. volume of Isovue-250 was injected into the epidural space and the flow of contrast was observed. Radiographs were obtained for documentation purposes.    The injectate was administered into the level noted above.   Additional Comments:  The patient tolerated the procedure well Dressing: Band-Aid    Post-procedure details: Patient was observed during the procedure. Post-procedure instructions were reviewed.  Patient left the clinic in stable condition.

## 2017-12-13 NOTE — Progress Notes (Signed)
Sarah Duncan - 52 y.o. female MRN [id number]  Date of birth: 1966/05/11  Office Visit Note: Visit Date: 11/29/2017 PCP: Harrison Mons, PA-C Referred by: Harrison Mons, PA-C  Subjective: Chief Complaint  Patient presents with  . Lower Back - Pain  . Left Leg - Pain   HPI: [name] is a 52 year old female followed by Dr. [name] and his assistant Benjiman Core.  She comes in today for requested sacroiliac joint injection by Benjiman Core.  She is having low back pain on the left side radiating into the left leg and this was confirmed with her a few times.  She reports worsening with standing and sitting for long period of time.  She does not really endorse necessarily pain going from sit to stand and does not really endorse necessarily pain over the SI joint although she has pain all over the low back area.  She reports the last injection only helped temporarily but point of fact that was done on the right side for more right-sided pain which she does not really endorse today.  Again she comes in for requested right sacroiliac joint injection.  I think given her findings on the left today we are going to complete a left interlaminar epidural steroid injection diagnostically hopefully therapeutically.   ROS Otherwise per HPI.  Assessment & Plan: Visit Diagnoses:  1. Lumbar radiculopathy   2. Sacroiliitis ([place])     Plan: No additional findings.   Meds & Orders:  Meds ordered this encounter  Medications  . methylPREDNISolone acetate (DEPO-MEDROL) injection 80 mg    Orders Placed This Encounter  Procedures  . XR C-ARM NO REPORT  . Epidural Steroid injection    Follow-up: Return if symptoms worsen or fail to improve.   Procedures: No procedures performed  Lumbar Epidural Steroid Injection - Interlaminar Approach with Fluoroscopic Guidance  [name]      Date of Birth: 1965-09-06 MRN: [id number] PCP: Harrison Mons,  PA-C      Visit Date: 11/29/2017   Universal Protocol:     Consent Given By: the patient  Position: PRONE  Additional Comments: Vital signs were monitored before and after the procedure. Patient was prepped and draped in the usual sterile fashion. The correct patient, procedure, and site was verified.   Injection Procedure Details:  Procedure Site One Meds Administered:  Meds ordered this encounter  Medications  . methylPREDNISolone acetate (DEPO-MEDROL) injection 80 mg     Laterality: Left  Location/Site:  L4-L5  Needle size: 20 G  Needle type: Tuohy  Needle Placement: Paramedian epidural  Findings:   -Comments: Excellent flow of contrast into the epidural space.  Procedure Details: Using a paramedian approach from the side mentioned above, the region overlying the inferior lamina was localized under fluoroscopic visualization and the soft tissues overlying this structure were infiltrated with 4 ml. of 1% Lidocaine without Epinephrine. The Tuohy needle was inserted into the epidural space using a paramedian approach.   The epidural space was localized using loss of resistance along with lateral and bi-planar fluoroscopic views.  After negative aspirate for air, blood, and CSF, a 2 ml. volume of Isovue-250 was injected into the epidural space and the flow of contrast was observed. Radiographs were obtained for documentation purposes.    The injectate was administered into the level noted above.   Additional Comments:  The patient tolerated the procedure well Dressing: Band-Aid    Post-procedure details: Patient was observed during the procedure. Post-procedure instructions  were reviewed.  Patient left the clinic in stable condition.   Clinical History: MRI LUMBAR SPINE WITHOUT CONTRAST  TECHNIQUE: Multiplanar, multisequence MR imaging of the lumbar spine was performed. No intravenous contrast was administered.  COMPARISON:  Lumbar spine MRI  04/30/2016  FINDINGS: Segmentation:  Standard.  Alignment:  Physiologic.  Vertebrae:  No fracture, evidence of discitis, or bone lesion.  Conus medullaris and cauda equina: Conus extends to the T12 level. Conus and cauda equina appear normal.  Paraspinal and other soft tissues: The visualized vascular, retroperitoneal and paraspinal structures are normal.  Disc levels:  The disc spaces from T10 to L3 are normal.  L3-L4: Small right subarticular disc protrusion and mild facet hypertrophy. Mild narrowing of the right lateral recess, unchanged. No spinal canal stenosis or neural foraminal stenosis.  L4-L5: Disc space narrowing severe right and moderate left facet hypertrophy, unchanged. No spinal canal stenosis. Narrowing of the right lateral recess is worsened. Moderate left neural foraminal stenosis is unchanged.  L5-S1: No disc herniation or stenosis.  IMPRESSION: 1. 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 2. Mild right L3-4 lateral recess narrowing.   Electronically Signed   By: Ulyses Jarred M.D.   On: 07/09/2017 04:23   She reports that she has quit smoking. Her smoking use included cigarettes. She quit after 1.00 year of use. She has never used smokeless tobacco.  Recent Labs    03/06/17 0905 06/05/17 1500 09/07/17 1132  HGBA1C 6.1* 6.1* 6.1*    Objective:  VS:  HT:    WT:   BMI:     BP:   HR: bpm  TEMP: ( )  RESP:  Physical Exam  Ortho Exam Imaging: No results found.  Past Medical/Family/Surgical/Social History: Medications & Allergies reviewed per EMR, new medications updated. Patient Active Problem List   Diagnosis Date Noted  . Generalized anxiety disorder 09/07/2017  . Essential hypertension 07/31/2017  . Other hyperlipidemia 03/06/2017  . Class 2 obesity with serious comorbidity and body mass index (BMI) of 35.0 to 35.9 in adult 01/08/2017  . Other chronic  pain 12/04/2016  . Prediabetes 10/18/2016  . Vitamin D deficiency 10/18/2016  . Depression 11/23/2015  . 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 Chronic lower back pain 06/18/2015   Past Medical History:  Diagnosis Date  . Anemia   . Anxiety   . Arthritis   . Constipation   . Degeneration of cervical intervertebral disc   . Depression   . Fibroids   . 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 Lactose intolerance   . Palpitations   . Shortness of breath   . 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 Diabetes Mother   . Hypertension Mother   . Diabetes Brother   . Hypertension Brother   . Cancer Father   . Endometriosis Daughter   . Diabetes Daughter   . HIV Son    Past Surgical History:  Procedure Laterality Date  . ABDOMINAL HYSTERECTOMY  2007  . CESAREAN SECTION  11/19/1989  . 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 SPINE SURGERY  07/13/2015   Lumbar - Dr. Christia Reading  . TOOTH EXTRACTION  04/16/2017  . TUBAL LIGATION  05/09/1993   Social History   Occupational History  . 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 Smoking status: Former Smoker    Years: 1.00    Types: Cigarettes  . Smokeless tobacco: Never Used  Substance and Sexual Activity  . Alcohol use: Yes    Alcohol/week: 0.6 oz    Types: 1 Standard drinks or equivalent per week    Comment: social  . Drug use: No  . Sexual activity: Yes    Partners: Female

## 2018-01-04 ENCOUNTER — Ambulatory Visit (INDEPENDENT_AMBULATORY_CARE_PROVIDER_SITE_OTHER): Payer: 59 | Admitting: Specialist

## 2018-02-04 ENCOUNTER — Ambulatory Visit (INDEPENDENT_AMBULATORY_CARE_PROVIDER_SITE_OTHER): Payer: 59 | Admitting: Specialist

## 2018-07-22 NOTE — Progress Notes (Signed)
This encounter was created in error - please disregard.

## 2018-09-14 ENCOUNTER — Emergency Department (HOSPITAL_COMMUNITY): Payer: BLUE CROSS/BLUE SHIELD

## 2018-09-14 ENCOUNTER — Encounter (HOSPITAL_COMMUNITY): Payer: Self-pay

## 2018-09-14 ENCOUNTER — Other Ambulatory Visit: Payer: Self-pay

## 2018-09-14 ENCOUNTER — Emergency Department (HOSPITAL_COMMUNITY)
Admission: EM | Admit: 2018-09-14 | Discharge: 2018-09-14 | Disposition: A | Discharge status: Home / Self Care | Payer: BLUE CROSS/BLUE SHIELD | Attending: Emergency Medicine | Admitting: Emergency Medicine

## 2018-09-14 DIAGNOSIS — R0602 Shortness of breath: Secondary | ICD-10-CM | POA: Diagnosis present

## 2018-09-14 DIAGNOSIS — M7918 Myalgia, other site: Secondary | ICD-10-CM | POA: Diagnosis not present

## 2018-09-14 DIAGNOSIS — Z87891 Personal history of nicotine dependence: Secondary | ICD-10-CM | POA: Diagnosis not present

## 2018-09-14 DIAGNOSIS — Z79899 Other long term (current) drug therapy: Secondary | ICD-10-CM | POA: Diagnosis not present

## 2018-09-14 DIAGNOSIS — I10 Essential (primary) hypertension: Secondary | ICD-10-CM | POA: Diagnosis not present

## 2018-09-14 DIAGNOSIS — R05 Cough: Secondary | ICD-10-CM

## 2018-09-14 DIAGNOSIS — R059 Cough, unspecified: Secondary | ICD-10-CM

## 2018-09-14 NOTE — ED Triage Notes (Signed)
Pt reports SOB, np cough and generalized weakness for the past 3 weeks. Pt states her wife tested positive for COVID-19 yesterday and her daughter is having similar symptoms as well. Pt afebrile at this time. A/o, tearful in triage.

## 2018-09-14 NOTE — ED Provider Notes (Signed)
[facility] EMERGENCY DEPARTMENT Provider Note   CSN: [id number] Arrival date & time: 09/14/18  2135    History   Chief Complaint Chief Complaint  Patient presents with  . Shortness of Breath  . Cough  . Weakness    HPI Sarah Duncan is a 53 y.o. female.     The history is provided by the patient.  Shortness of Breath  Severity:  Moderate Onset quality:  Gradual Duration:  2 days Timing:  Constant Progression:  Worsening Chronicity:  New Relieved by:  Nothing Worsened by:  Nothing Ineffective treatments:  None tried Associated symptoms: cough   Risk factors: no recent alcohol use   Cough  Associated symptoms: shortness of breath   Weakness  Associated symptoms: cough and shortness of breath   Pt reports her spouse and her child have both tested positive for covid.  Pt reports she has bodyaches and feels short of breath.  Pt thinks she has had a fever.   Past Medical History:  Diagnosis Date  . Anemia   . Anxiety   . Arthritis   . Constipation   . Degeneration of cervical intervertebral disc   . Depression   . Fibroids   . 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 Lactose intolerance   . Palpitations   . Shortness of breath   . 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 Generalized anxiety disorder 09/07/2017  . Essential hypertension 07/31/2017  . Other hyperlipidemia 03/06/2017  . Class 2 obesity with serious comorbidity and body mass index (BMI) of 35.0 to 35.9 in adult 01/08/2017  . Other chronic pain 12/04/2016  . Prediabetes 10/18/2016  . Vitamin D deficiency 10/18/2016  . Depression 11/23/2015  . 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 Chronic lower back pain 06/18/2015    Past Surgical History:  Procedure Laterality Date  . ABDOMINAL HYSTERECTOMY  2007  . CESAREAN SECTION  11/19/1989  . NEPHRECTOMY LIVING DONOR Left    donated to her cousin   . SPINE SURGERY  07/13/2015   Lumbar - Dr. Christia Reading  . TOOTH EXTRACTION  04/16/2017  . TUBAL LIGATION  05/09/1993     OB History    Gravida  4   Para  4   Term  4   Preterm      AB      Living  4     SAB      TAB      Ectopic      Multiple      Live Births               Home Medications    Prior to Admission medications   Medication Sig Start Date End Date Taking? Authorizing Provider  acetaminophen (TYLENOL) 650 MG CR tablet Take 1,300 mg by mouth every 8 (eight) hours as needed for pain or fever.   Yes [provider]  busPIRone (BUSPAR) 15 MG tablet Take 15 mg by mouth 2 (two) times daily.  12/07/17  Yes [provider]  DULoxetine (CYMBALTA) 60 MG capsule Take 1 capsule (60 mg total) by mouth daily. 09/07/17  Yes Jeffery, Domingo Mend, PA  Melatonin 10 MG TABS Take 10 mg by mouth at bedtime.   Yes [provider]  Multiple Vitamin (MULTIVITAMIN WITH MINERALS) TABS tablet Take 1 tablet by mouth daily.   Yes [provider]  tiZANidine (ZANAFLEX) 4  MG tablet Take 0.5-1 tablets (2-4 mg total) by mouth every 6 (six) hours as needed for muscle spasms. Not more than 3 times daily. 10/08/17  Yes Jeffery, Chelle, PA  promethazine (PHENERGAN) 12.5 MG tablet Take 1-2 tablets (12.5-25 mg total) by mouth every 8 (eight) hours as needed for nausea or vomiting. Patient not taking: Reported on 09/14/2018 04/17/17   Harrison Mons, PA    Family History Family History  Problem Relation Age of Onset  . Diabetes Mother   . Hypertension Mother   . Diabetes Brother   . Hypertension Brother   . Cancer Father   . Endometriosis Daughter   . Diabetes Daughter   . HIV Son     Social History Social History   Tobacco Use  . Smoking status: Former Smoker    Years: 1.00    Types: Cigarettes  . Smokeless tobacco: Never Used  Substance Use Topics  . Alcohol use: Yes    Alcohol/week: 1.0 standard drinks    Types: 1 Standard drinks or equivalent  per week    Comment: social  . Drug use: No     Allergies   Patient has no known allergies.   Review of Systems Review of Systems  Respiratory: Positive for cough and shortness of breath.   Neurological: Positive for weakness.  All other systems reviewed and are negative.    Physical Exam Updated Vital Signs BP (!) 146/99 (BP Location: Right Arm)   Pulse 88   Temp 98.7 F (37.1 C) (Oral)   Resp 20   Ht 5\' 7"  (1.702 m)   Wt 95.3 kg   SpO2 100%   BMI 32.89 kg/m   Physical Exam Vitals signs and nursing note reviewed.  Constitutional:      Appearance: She is well-developed.  HENT:     Head: Normocephalic.  Neck:     Musculoskeletal: Normal range of motion.  Cardiovascular:     Rate and Rhythm: Normal rate.  Pulmonary:     Effort: Pulmonary effort is normal.     Breath sounds: Normal breath sounds.  Chest:     Chest wall: No mass.  Abdominal:     General: There is no distension.     Palpations: Abdomen is soft.  Musculoskeletal: Normal range of motion.  Skin:    General: Skin is warm.  Neurological:     Mental Status: She is alert and oriented to person, place, and time.  Psychiatric:        Mood and Affect: Mood normal.      ED Treatments / Results  Labs (all labs ordered are listed, but only abnormal results are displayed) Labs Reviewed  SARS CORONAVIRUS 2 (HOSPITAL ORDER, [facility])    EKG None  Radiology Dg Chest Port 1 View  Result Date: 09/14/2018 CLINICAL DATA:  Cough and COVID-19 exposure. EXAM: PORTABLE CHEST 1 VIEW COMPARISON:  None. FINDINGS: The heart size and mediastinal contours are within normal limits. Both lungs are clear. The visualized skeletal structures are unremarkable. IMPRESSION: No active disease. Electronically Signed   By: Ulyses Jarred M.D.   On: 09/14/2018 23:00    Procedures Procedures (including critical care time)  Medications Ordered in ED Medications - No data to display    Initial Impression / Assessment and Plan / ED Course  I have reviewed the triage vital signs and the nursing notes.  Pertinent labs & imaging results that were available during my care of the patient were reviewed  by me and considered in my medical decision making (see chart for details).        MDM  Chest xray is normal.  Pt has normal temperature, normal vital signs and 02 sats of 100%.  Pt has known covid exposure in her home.  I suspect pt has covid.  Pt counseled.   Final Clinical Impressions(s) / ED Diagnoses   Final diagnoses:  Cough    ED Discharge Orders    None    Sarah Duncan was evaluated in Emergency Department on 09/14/2018 for the symptoms described in the history of present illness. She was evaluated in the context of the global COVID-19 pandemic, which necessitated consideration that the patient might be at risk for infection with the SARS-CoV-2 virus that causes COVID-19. Institutional protocols and algorithms that pertain to the evaluation of patients at risk for COVID-19 are in a state of rapid change based on information released by regulatory bodies including the CDC and federal and state organizations. These policies and algorithms were followed during the patient's care in the ED. An After Visit Summary was printed and given to the patient.    Sidney Ace 09/14/18 2320    Lajean Saver, MD 09/15/18 [phone]

## 2018-09-14 NOTE — Discharge Instructions (Addendum)
Person Under Monitoring Name: Sarah Duncan  Location: [place] [address]   Infection Prevention Recommendations for Individuals Confirmed to have, or Being Evaluated for, 2019 Novel Coronavirus (COVID-19) Infection Who Receive Care at Home  Individuals who are confirmed to have, or are being evaluated for, COVID-19 should follow the prevention steps below until a healthcare provider or local or state health department says they can return to normal activities.  Stay home except to get medical care You should restrict activities outside your home, except for getting medical care. Do not go to work, school, or public areas, and do not use public transportation or taxis.  Call ahead before visiting your doctor Before your medical appointment, call the healthcare provider and tell them that you have, or are being evaluated for, COVID-19 infection. This will help the healthcare providers office take steps to keep other people from getting infected. Ask your healthcare provider to call the local or state health department.  Monitor your symptoms Seek prompt medical attention if your illness is worsening (e.g., difficulty breathing). Before going to your medical appointment, call the healthcare provider and tell them that you have, or are being evaluated for, COVID-19 infection. Ask your healthcare provider to call the local or state health department.  Wear a facemask You should wear a facemask that covers your nose and mouth when you are in the same room with other people and when you visit a healthcare provider. People who live with or visit you should also wear a facemask while they are in the same room with you.  Separate yourself from other people in your home As much as possible, you should stay in a different room from other people in your home. Also, you should use a separate bathroom, if available.  Avoid sharing household items You should  not share dishes, drinking glasses, cups, eating utensils, towels, bedding, or other items with other people in your home. After using these items, you should wash them thoroughly with soap and water.  Cover your coughs and sneezes Cover your mouth and nose with a tissue when you cough or sneeze, or you can cough or sneeze into your sleeve. Throw used tissues in a lined trash can, and immediately wash your hands with soap and water for at least 20 seconds or use an alcohol-based hand rub.  Wash your [facility] your hands often and thoroughly with soap and water for at least 20 seconds. You can use an alcohol-based hand sanitizer if soap and water are not available and if your hands are not visibly dirty. Avoid touching your eyes, nose, and mouth with unwashed hands.   Prevention Steps for Caregivers and Household Members of Individuals Confirmed to have, or Being Evaluated for, COVID-19 Infection Being Cared for in the Home  If you live with, or provide care at home for, a person confirmed to have, or being evaluated for, COVID-19 infection please follow these guidelines to prevent infection:  Follow healthcare providers instructions Make sure that you understand and can help the patient follow any healthcare provider instructions for all care.  Provide for the patients basic needs You should help the patient with basic needs in the home and provide support for getting groceries, prescriptions, and other personal needs.  Monitor the patients symptoms If they are getting sicker, call his or her medical provider and tell them that the patient has, or is being evaluated for, COVID-19 infection. This will help the healthcare providers office  take steps to keep other people from getting infected. Ask the healthcare provider to call the local or state health department.  Limit the number of people who have contact with the patient If possible, have only one caregiver for the  patient. Other household members should stay in another home or place of residence. If this is not possible, they should stay in another room, or be separated from the patient as much as possible. Use a separate bathroom, if available. Restrict visitors who do not have an essential need to be in the home.  Keep older adults, very young children, and other sick people away from the patient Keep older adults, very young children, and those who have compromised immune systems or chronic health conditions away from the patient. This includes people with chronic heart, lung, or kidney conditions, diabetes, and cancer.  Ensure good ventilation Make sure that shared spaces in the home have good air flow, such as from an air conditioner or an opened window, weather permitting.  Wash your hands often Wash your hands often and thoroughly with soap and water for at least 20 seconds. You can use an alcohol based hand sanitizer if soap and water are not available and if your hands are not visibly dirty. Avoid touching your eyes, nose, and mouth with unwashed hands. Use disposable paper towels to dry your hands. If not available, use dedicated cloth towels and replace them when they become wet.  Wear a facemask and gloves Wear a disposable facemask at all times in the room and gloves when you touch or have contact with the patients blood, body fluids, and/or secretions or excretions, such as sweat, saliva, sputum, nasal mucus, vomit, urine, or feces.  Ensure the mask fits over your nose and mouth tightly, and do not touch it during use. Throw out disposable facemasks and gloves after using them. Do not reuse. Wash your hands immediately after removing your facemask and gloves. If your personal clothing becomes contaminated, carefully remove clothing and launder. Wash your hands after handling contaminated clothing. Place all used disposable facemasks, gloves, and other waste in a lined container before  disposing them with other household waste. Remove gloves and wash your hands immediately after handling these items.  Do not share dishes, glasses, or other household items with the patient Avoid sharing household items. You should not share dishes, drinking glasses, cups, eating utensils, towels, bedding, or other items with a patient who is confirmed to have, or being evaluated for, COVID-19 infection. After the person uses these items, you should wash them thoroughly with soap and water.  Wash laundry thoroughly Immediately remove and wash clothes or bedding that have blood, body fluids, and/or secretions or excretions, such as sweat, saliva, sputum, nasal mucus, vomit, urine, or feces, on them. Wear gloves when handling laundry from the patient. Read and follow directions on labels of laundry or clothing items and detergent. In general, wash and dry with the warmest temperatures recommended on the label.  Clean all areas the individual has used often Clean all touchable surfaces, such as counters, tabletops, doorknobs, bathroom fixtures, toilets, phones, keyboards, tablets, and bedside tables, every day. Also, clean any surfaces that may have blood, body fluids, and/or secretions or excretions on them. Wear gloves when cleaning surfaces the patient has come in contact with. Use a diluted bleach solution (e.g., dilute bleach with 1 part bleach and 10 parts water) or a household disinfectant with a label that says EPA-registered for coronaviruses. To make a bleach  solution at home, add 1 tablespoon of bleach to 1 quart (4 cups) of water. For a larger supply, add  cup of bleach to 1 gallon (16 cups) of water. Read labels of cleaning products and follow recommendations provided on product labels. Labels contain instructions for safe and effective use of the cleaning product including precautions you should take when applying the product, such as wearing gloves or eye protection and making sure you  have good ventilation during use of the product. Remove gloves and wash hands immediately after cleaning.  Monitor yourself for signs and symptoms of illness Caregivers and household members are considered close contacts, should monitor their health, and will be asked to limit movement outside of the home to the extent possible. Follow the monitoring steps for close contacts listed on the symptom monitoring form.   ? If you have additional questions, contact your local health department or call the epidemiologist on call at [phone] (available 24/7). ? This guidance is subject to change. For the most up-to-date guidance from [facility], please refer to their website: YouBlogs.pl   Tylenol as needed for fever and body aches.

## 2018-09-15 LAB — SARS CORONAVIRUS 2 BY RT PCR (HOSPITAL ORDER, PERFORMED IN ~~LOC~~ HOSPITAL LAB): SARS Coronavirus 2: POSITIVE — AB

## 2018-10-18 ENCOUNTER — Telehealth: Payer: Self-pay | Admitting: *Deleted

## 2018-10-18 NOTE — Telephone Encounter (Signed)
Left a voicemail for her to call back regarding donating her plasma.   I left the phone number [phone] and to let person answering the phone know she is returning a call pertaining to plasma donation.

## 2018-10-23 ENCOUNTER — Ambulatory Visit (INDEPENDENT_AMBULATORY_CARE_PROVIDER_SITE_OTHER): Payer: Medicare Other | Admitting: Surgery

## 2018-10-23 ENCOUNTER — Encounter: Payer: Self-pay | Admitting: Surgery

## 2018-10-23 ENCOUNTER — Other Ambulatory Visit: Payer: Self-pay

## 2018-10-23 ENCOUNTER — Ambulatory Visit: Payer: Self-pay

## 2018-10-23 DIAGNOSIS — M545 Low back pain, unspecified: Secondary | ICD-10-CM

## 2018-10-23 DIAGNOSIS — M5136 Other intervertebral disc degeneration, lumbar region: Secondary | ICD-10-CM

## 2018-10-25 NOTE — Progress Notes (Signed)
Office Visit Note   [name]           Date of Birth: 1966-01-23           MRN: [id number] Visit Date: 10/23/2018              Requested by: Bartholome Bill, [place], [place] [address] PCP: Bartholome Bill, MD   Assessment & Plan: Visit Diagnoses:  1. Low back pain, unspecified back pain laterality, unspecified chronicity, unspecified whether sciatica present   2. Other intervertebral disc degeneration, lumbar region     Plan: With patient's ongoing symptoms and new finding of right anterior tib weakness I recommend repeating her lumbar MRI and comparing to the study that was done February 2019.  Follow-up with Dr. Louanne Skye after completion to discuss results and further treatment options.  Patient states that if surgery is recommended she is wanting to entertain that.  Follow-Up Instructions: Return in about 2 weeks (around 11/06/2018) for nitka to review lumar MRI.   Orders:  Orders Placed This Encounter  Procedures  . XR Lumbar Spine 2-3 Views  . MR Lumbar Spine w/o contrast   No orders of the defined types were placed in this encounter.     Procedures: No procedures performed   Clinical Data: No additional findings.   Subjective: Chief Complaint  Patient presents with  . Lower Back - Pain    HPI 53 year old black female history of chronic low back pain returns.  Patient was last seen by me December 12, 2017 and I had wanted her to come back a couple weeks after that to see Dr. Louanne Skye to discuss further treatment options for her SI joint pain and lumbar spondylosis.  Patient was unable to return until now due to a change in her insurance.  She continues to have ongoing pain in her low back that radiates to the right calf.  Some numbness and tingling in to the calf as well.  No left leg symptoms.  States that walking distances have decreased due to back pain and also pain into the leg.  She also describes  some feeling of right lower leg weakness with intermittent dropfoot type symptoms the further she walks.  Patient was not having this new problem last office visits with me. Last lumbar MRI performed July 09, 2017 and report showed:  EXAM: MRI LUMBAR SPINE WITHOUT CONTRAST  TECHNIQUE: Multiplanar, multisequence MR imaging of the lumbar spine was performed. No intravenous contrast was administered.  COMPARISON:  Lumbar spine MRI 04/30/2016  FINDINGS: Segmentation:  Standard.  Alignment:  Physiologic.  Vertebrae:  No fracture, evidence of discitis, or bone lesion.  Conus medullaris and cauda equina: Conus extends to the T12 level. Conus and cauda equina appear normal.  Paraspinal and other soft tissues: The visualized vascular, retroperitoneal and paraspinal structures are normal.  Disc levels:  The disc spaces from T10 to L3 are normal.  L3-L4: Small right subarticular disc protrusion and mild facet hypertrophy. Mild narrowing of the right lateral recess, unchanged. No spinal canal stenosis or neural foraminal stenosis.  L4-L5: Disc space narrowing severe right and moderate left facet hypertrophy, unchanged. No spinal canal stenosis. Narrowing of the right lateral recess is worsened. Moderate left neural foraminal stenosis is unchanged.  L5-S1: No disc herniation or stenosis.  IMPRESSION: 1. 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 2. Mild right L3-4 lateral recess narrowing.  Review of Systems No current cardiac pulmonary GI GU issues  Objective: Vital Signs: There were no vitals taken for this visit.  Physical Exam HENT:     Head: Normocephalic.  Eyes:     Extraocular Movements: Extraocular movements intact.     Pupils: Pupils are equal, round, and reactive to light.  Pulmonary:     Effort: No respiratory distress.  Musculoskeletal:     Comments: Gait is somewhat  antalgic.  Bilateral lumbar paraspinal tenderness.  Negative straight leg raise.  Some tenderness over the right hip greater trochanter bursa.  Mild tenderness over the right SI joint.  Patient does have right anterior tib and EHL weakness when compared to the left.  Patient is able to actively dorsiflex her foot.  All other strength maneuvers within normal limits.  Bilateral calves are nontender.  Skin:    General: Skin is warm and dry.  Neurological:     Mental Status: She is alert and oriented to person, place, and time.  Psychiatric:        Mood and Affect: Mood normal.     Ortho Exam  Specialty Comments:  No specialty comments available.  Imaging: No results found.   PMFS History: Patient Active Problem List   Diagnosis Date Noted  . Generalized anxiety disorder 09/07/2017  . Essential hypertension 07/31/2017  . Other hyperlipidemia 03/06/2017  . Class 2 obesity with serious comorbidity and body mass index (BMI) of 35.0 to 35.9 in adult 01/08/2017  . Other chronic pain 12/04/2016  . Prediabetes 10/18/2016  . Vitamin D deficiency 10/18/2016  . Depression 11/23/2015  . 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 Chronic lower back pain 06/18/2015   Past Medical History:  Diagnosis Date  . Anemia   . Anxiety   . Arthritis   . Constipation   . Degeneration of cervical intervertebral disc   . Depression   . Fibroids   . 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 Lactose intolerance   . Palpitations   . Shortness of breath   . 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 Diabetes Mother   . Hypertension Mother   . Diabetes Brother   . Hypertension Brother   . Cancer Father   . Endometriosis Daughter   . Diabetes Daughter   . HIV Son     Past Surgical History:  Procedure Laterality Date  . ABDOMINAL HYSTERECTOMY  2007  . CESAREAN SECTION  11/19/1989  . NEPHRECTOMY LIVING DONOR Left    donated to her  cousin  . SPINE SURGERY  07/13/2015   Lumbar - Dr. Christia Reading  . TOOTH EXTRACTION  04/16/2017  . TUBAL LIGATION  05/09/1993   Social History   Occupational History  . 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 Smoking status: Former Smoker    Years: 1.00    Types: Cigarettes  . Smokeless tobacco: Never Used  Substance and Sexual Activity  . Alcohol use: Yes    Alcohol/week: 1.0 standard drinks    Types: 1 Standard drinks or equivalent per week    Comment: social  . Drug use: No  . Sexual activity: Yes    Partners: Female

## 2018-11-07 ENCOUNTER — Ambulatory Visit
Admission: RE | Admit: 2018-11-07 | Discharge: 2018-11-07 | Disposition: A | Discharge status: Home / Self Care | Payer: Medicare Other | Source: Ambulatory Visit | Attending: Surgery | Admitting: Surgery

## 2018-11-07 DIAGNOSIS — M48061 Spinal stenosis, lumbar region without neurogenic claudication: Secondary | ICD-10-CM | POA: Diagnosis not present

## 2018-11-07 DIAGNOSIS — M5136 Other intervertebral disc degeneration, lumbar region: Secondary | ICD-10-CM

## 2018-11-08 DIAGNOSIS — Z1231 Encounter for screening mammogram for malignant neoplasm of breast: Secondary | ICD-10-CM | POA: Diagnosis not present

## 2018-11-15 DIAGNOSIS — Z1211 Encounter for screening for malignant neoplasm of colon: Secondary | ICD-10-CM | POA: Diagnosis not present

## 2018-11-15 DIAGNOSIS — K640 First degree hemorrhoids: Secondary | ICD-10-CM | POA: Diagnosis not present

## 2018-11-26 DIAGNOSIS — N6311 Unspecified lump in the right breast, upper outer quadrant: Secondary | ICD-10-CM | POA: Diagnosis not present

## 2018-11-26 DIAGNOSIS — N6312 Unspecified lump in the right breast, upper inner quadrant: Secondary | ICD-10-CM | POA: Diagnosis not present

## 2018-11-26 DIAGNOSIS — R922 Inconclusive mammogram: Secondary | ICD-10-CM | POA: Diagnosis not present

## 2018-11-26 DIAGNOSIS — R928 Other abnormal and inconclusive findings on diagnostic imaging of breast: Secondary | ICD-10-CM | POA: Diagnosis not present

## 2018-11-28 ENCOUNTER — Ambulatory Visit: Payer: Medicare Other | Admitting: Specialist

## 2018-11-29 ENCOUNTER — Ambulatory Visit (INDEPENDENT_AMBULATORY_CARE_PROVIDER_SITE_OTHER): Payer: Medicare Other | Admitting: Specialist

## 2018-11-29 ENCOUNTER — Other Ambulatory Visit: Payer: Self-pay

## 2018-11-29 ENCOUNTER — Encounter: Payer: Self-pay | Admitting: Specialist

## 2018-11-29 VITALS — BP 123/82 | HR 78 | Ht 67.0 in | Wt 210.0 lb

## 2018-11-29 DIAGNOSIS — M5136 Other intervertebral disc degeneration, lumbar region: Secondary | ICD-10-CM

## 2018-11-29 DIAGNOSIS — M5416 Radiculopathy, lumbar region: Secondary | ICD-10-CM

## 2018-11-29 DIAGNOSIS — M5116 Intervertebral disc disorders with radiculopathy, lumbar region: Secondary | ICD-10-CM

## 2018-11-29 DIAGNOSIS — M48062 Spinal stenosis, lumbar region with neurogenic claudication: Secondary | ICD-10-CM | POA: Diagnosis not present

## 2018-11-29 MED ORDER — HYDROCODONE-ACETAMINOPHEN 5-325 MG PO TABS: 1.0000 | ORAL_TABLET | Freq: Four times a day (QID) | ORAL | 0 refills | Status: DC | PRN

## 2018-11-29 NOTE — Patient Instructions (Signed)
Avoid bending, stooping and avoid lifting weights greater than 10 lbs. Avoid prolong standing and walking. Order for a new walker with wheels. Surgery scheduling secretary Sherri Billings, will call you in the next week to schedule for surgery.  Surgery recommended is a two level lumbar fusion L3-4 and L4-5 this would be done with rods, screws and cages with local bone graft and allograft (donor bone graft). Take hydrocodone for for pain. Risk of surgery includes risk of infection 1 in 200 patients, bleeding 1/2% chance you would need a transfusion.   Risk to the nerves is one in 10,000. You will need to use a brace for 3 months and wean from the brace on the 4th month. Expect improved walking and standing tolerance. Expect relief of leg pain but numbness may persist depending on the length and degree of pressure that has been present.   

## 2018-11-29 NOTE — Progress Notes (Addendum)
Office Visit Note   [name]           Date of Birth: 1966/05/20           MRN: [id number] Visit Date: 11/29/2018              Requested by: Bartholome Bill, [address] [place],  [place] [address] PCP: Bartholome Bill, MD   Assessment & Plan: Visit Diagnoses:  1. Other intervertebral disc degeneration, lumbar region   2. Spinal stenosis of lumbar region with neurogenic claudication   3. Herniation of lumbar intervertebral disc with radiculopathy   4. Radiculopathy, lumbar region     Plan: Avoid bending, stooping and avoid lifting weights greater than 10 lbs. Avoid prolong standing and walking. Order for a new walker with wheels. Surgery scheduling secretary Kandice Hams, will call you in the next week to schedule for surgery.  Surgery recommended is a two level lumbar fusion L3-4 and L4-5 this would be done with rods, screws and cages with local bone graft and allograft (donor bone graft). Take hydrocodone for for pain. Risk of surgery includes risk of infection 1 in 200 patients, bleeding 1/2% chance you would need a transfusion.   Risk to the nerves is one in 10,000. You will need to use a brace for 3 months and wean from the brace on the 4th month. Expect improved walking and standing tolerance. Expect relief of leg pain but numbness may persist depending on the length and degree of pressure that has been present.  Follow-Up Instructions: Return in about 4 weeks (around 12/27/2018).   Orders:  No orders of the defined types were placed in this encounter.  Meds ordered this encounter  Medications  . HYDROcodone-acetaminophen (NORCO/VICODIN) 5-325 MG tablet    Sig: Take 1 tablet by mouth every 6 (six) hours as needed for moderate pain.    Dispense:  30 tablet    Refill:  0      Procedures: No procedures performed   Clinical Data: Findings:  CLINICAL DATA:  Chronic low back pain radiating down the right leg   EXAM: MRI LUMBAR SPINE WITHOUT CONTRAST  TECHNIQUE: Multiplanar, multisequence MR imaging of the lumbar spine was performed. No intravenous contrast was administered.  COMPARISON:  07/09/2017  FINDINGS: Segmentation:  Standard.  Alignment:  Physiologic.  Vertebrae:  No fracture, evidence of discitis, or bone lesion.  Conus medullaris and cauda equina: Conus extends to the T12 level. Conus and cauda equina appear normal.  Paraspinal and other soft tissues: No acute paraspinal abnormality.  Disc levels:  Disc spaces: Degenerative disc disease with disc height loss at L4-5.  T12-L1: No significant disc bulge. No evidence of neural foraminal stenosis. No central canal stenosis.  L1-L2: No significant disc bulge. No evidence of neural foraminal stenosis. No central canal stenosis. Mild bilateral facet arthropathy.  L2-L3: No significant disc bulge. No evidence of neural foraminal stenosis. No central canal stenosis.  L3-L4: Broad-based disc bulge with a small central disc protrusion. Mild bilateral facet arthropathy. Right lateral recess narrowing. No foraminal stenosis. No central canal stenosis.  L4-L5: Broad-based disc bulge. Moderate right and mild left facet arthropathy. Mild bilateral foraminal narrowing. No central canal stenosis.  L5-S1: No significant disc bulge. No evidence of neural foraminal stenosis. No central canal stenosis.  IMPRESSION: 1. At L3-4 there is a broad-based disc bulge with a small central disc protrusion. Mild bilateral facet arthropathy. Right lateral recess narrowing. 2. At  L4-5 there is a broad-based disc bulge. Moderate right and mild left facet arthropathy. Mild bilateral foraminal narrowing.   Electronically Signed   By: Kathreen Devoid   On: 11/07/2018 16:44     Subjective: Chief Complaint  Patient presents with  . Lower Back - Follow-up    53 year old female with history of lumbar laminectomy at  [facility], in 2017 prior to coming to [place]. The surgery was for a bulging disc and in 03/2016 she began having sciatica and then had surgery and the pain seemed to get worse. Since then the pain has been worse and she is unable to get relief. The pain is worse with standing and walking. She has pain with sitting also and riding long distances increased the pain, She uses a pillow to extend the back. Went through PT and ESIs, had traction with increased pain. No bowel or bladder Difficulties. She avoids twisting and mopping and sweeping and raking the yard.   Review of Systems  Constitutional: Negative.   HENT: Negative.   Eyes: Negative.   Respiratory: Negative.   Cardiovascular: Negative.   Gastrointestinal: Negative.   Endocrine: Negative.   Genitourinary: Negative.   Musculoskeletal: Negative.   Skin: Negative.   Allergic/Immunologic: Negative.   Neurological: Negative.   Hematological: Negative.   Psychiatric/Behavioral: Negative.      Objective: Vital Signs: BP 123/82   Pulse 78   Ht 5\' 7"  (1.702 m)   Wt 210 lb (95.3 kg)   BMI 32.89 kg/m   Physical Exam  Back Exam   Tenderness  The patient is experiencing tenderness in the lumbar.  Range of Motion  Extension: abnormal  Flexion:  50 abnormal  Lateral bend right: abnormal  Lateral bend left: abnormal  Rotation right: abnormal  Rotation left: abnormal   Muscle Strength  Right Quadriceps:  4/5  Left Quadriceps:  5/5  Right Hamstrings:  5/5  Left Hamstrings:  5/5   Reflexes  Patellar: 0/4 Achilles: 2/4 Biceps: 2/4 Babinski's sign: normal   Other  Toe walk: normal Heel walk: normal Sensation: decreased Gait: normal  Erythema: no back redness Scars: present  Comments:  Right quad weakness 4/5. Pain with flexion and pushes off knees to regain upright.      Specialty Comments:  No specialty comments available.  Imaging: Dg Chest 2 View  Result Date: 12/09/2018 CLINICAL  DATA:  Preop lumbar surgery. EXAM: CHEST - 2 VIEW COMPARISON:  09/14/2018 FINDINGS: Lungs are adequately inflated and otherwise clear. Cardiomediastinal silhouette and remainder of the exam is unchanged. IMPRESSION: No active cardiopulmonary disease. Electronically Signed   By: Marin Olp M.D.   On: 12/09/2018 21:36     PMFS History: Patient Active Problem List   Diagnosis Date Noted  . Generalized anxiety disorder 09/07/2017  . Essential hypertension 07/31/2017  . Other hyperlipidemia 03/06/2017  . Class 2 obesity with serious comorbidity and body mass index (BMI) of 35.0 to 35.9 in adult 01/08/2017  . Other chronic pain 12/04/2016  . Prediabetes 10/18/2016  . Vitamin D deficiency 10/18/2016  . Depression 11/23/2015  . 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 Chronic lower back pain 06/18/2015   Past Medical History:  Diagnosis Date  . Anemia   . Anxiety   . Arthritis   . Constipation   . Degeneration of cervical intervertebral disc   . Depression   . Fibroids   . 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 Lactose intolerance   . Palpitations   .  Shortness of breath   . 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 Diabetes Mother   . Hypertension Mother   . Diabetes Brother   . Hypertension Brother   . Cancer Father   . Endometriosis Daughter   . Diabetes Daughter   . HIV Son     Past Surgical History:  Procedure Laterality Date  . ABDOMINAL HYSTERECTOMY  2007  . CESAREAN SECTION  11/19/1989  . 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 SPINE SURGERY  07/13/2015   Lumbar - Dr. Christia Reading  . TOOTH EXTRACTION  04/16/2017  . TUBAL LIGATION  05/09/1993   Social History   Occupational History  . 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 Smoking status: Former Smoker    Years: 1.00    Types: Cigarettes  . Smokeless tobacco: Never Used  Substance  and Sexual Activity  . Alcohol use: Yes    Alcohol/week: 1.0 standard drinks    Types: 1 Standard drinks or equivalent per week    Comment: social  . Drug use: No  . Sexual activity: Yes    Partners: Female

## 2018-12-04 DIAGNOSIS — F331 Major depressive disorder, recurrent, moderate: Secondary | ICD-10-CM | POA: Diagnosis not present

## 2018-12-04 DIAGNOSIS — F411 Generalized anxiety disorder: Secondary | ICD-10-CM | POA: Diagnosis not present

## 2018-12-05 ENCOUNTER — Ambulatory Visit (INDEPENDENT_AMBULATORY_CARE_PROVIDER_SITE_OTHER): Payer: Medicare Other | Admitting: Surgery

## 2018-12-05 ENCOUNTER — Encounter: Payer: Self-pay | Admitting: Surgery

## 2018-12-05 ENCOUNTER — Other Ambulatory Visit: Payer: Self-pay

## 2018-12-05 VITALS — BP 119/85 | HR 78 | Ht 67.0 in | Wt 210.0 lb

## 2018-12-05 DIAGNOSIS — M48062 Spinal stenosis, lumbar region with neurogenic claudication: Secondary | ICD-10-CM

## 2018-12-05 NOTE — Progress Notes (Signed)
53 year old black female with history of right L3-4 and L4-5 stenosis comes in for preop evaluation.  States that symptoms unchanged from last office visit and she is wanting to proceed with right L3-4 and L4-5 fusion as previously discussed.  Today history and physical performed.  Surgical procedure along with rehab/recovery time discussed.  All questions answered and she wishes to proceed.

## 2018-12-06 ENCOUNTER — Other Ambulatory Visit (HOSPITAL_COMMUNITY)
Admission: RE | Admit: 2018-12-06 | Discharge: 2018-12-06 | Disposition: A | Discharge status: Home / Self Care | Payer: Medicare Other | Source: Ambulatory Visit | Attending: Specialist | Admitting: Specialist

## 2018-12-06 DIAGNOSIS — Z1159 Encounter for screening for other viral diseases: Secondary | ICD-10-CM | POA: Insufficient documentation

## 2018-12-06 DIAGNOSIS — D241 Benign neoplasm of right breast: Secondary | ICD-10-CM | POA: Diagnosis not present

## 2018-12-06 DIAGNOSIS — N6315 Unspecified lump in the right breast, overlapping quadrants: Secondary | ICD-10-CM | POA: Diagnosis not present

## 2018-12-06 DIAGNOSIS — R928 Other abnormal and inconclusive findings on diagnostic imaging of breast: Secondary | ICD-10-CM | POA: Diagnosis not present

## 2018-12-06 LAB — SARS CORONAVIRUS 2 (TAT 6-24 HRS): SARS Coronavirus 2: NEGATIVE

## 2018-12-06 NOTE — Pre-Procedure Instructions (Signed)
[facility] ([address]), [place] [address] [place] [address] Phone: [phone] Fax: [phone]      Your procedure is scheduled on Tuesday July 21st.  Report to [facility] Main Entrance "A" at 5:30 A.M., and check in at the Admitting office.  Call this number if you have problems the morning of surgery:  [phone]  Call [phone] if you have any questions prior to your surgery date Monday-Friday 8am-4pm    Remember:  Do not eat or drink after midnight the night before your surgery  You may drink clear liquids until 4:30 the morning of your surgery.   Clear liquids allowed are: Water, Non-Citrus Juices (without pulp), Carbonated Beverages, Clear Tea, Black Coffee Only, and Gatorade. Your surgeon has prescribed a pre-surgery Ensure carbohydrate drink. Make sure to complete this by 4:30 AM the morning of surgery. Drink in one sitting, do not sip.    Take these medicines the morning of surgery with A SIP OF WATER  busPIRone (BUSPAR) acetaminophen (TYLENOL)-if needed HYDROcodone-acetaminophen (NORCO/VICODIN)-if needed promethazine (PHENERGAN)-if needed tiZANidine (ZANAFLEX)-if needed  As of TODAY, STOP taking any Aspirin (unless otherwise instructed by your surgeon), Aleve, Naproxen, Ibuprofen, Motrin, Advil, Goody's, BC's, all herbal medications, fish oil, and all vitamins.    The Morning of Surgery  Do not wear jewelry, make-up or nail polish.  Do not wear lotions, powders, or perfumes/colognes, or deodorant  Do not shave 48 hours prior to surgery.  Men may shave face and neck.  Do not bring valuables to the hospital.  [facility] is not responsible for any belongings or valuables.  If you are a smoker, DO NOT Smoke 24 hours prior to surgery IF you wear a CPAP at night please bring your mask, tubing, and machine the morning of surgery   Remember that you must have someone to transport you home after your surgery,  and remain with you for 24 hours if you are discharged the same day.   Contacts, glasses, hearing aids, dentures or bridgework may not be worn into surgery.    Leave your suitcase in the car.  After surgery it may be brought to your room.  For patients admitted to the hospital, discharge time will be determined by your treatment team.  Patients discharged the day of surgery will not be allowed to drive home.    Special instructions:   [facility]- Preparing For Surgery  Before surgery, you can play an important role. Because skin is not sterile, your skin needs to be as free of germs as possible. You can reduce the number of germs on your skin by washing with CHG (chlorahexidine gluconate) Soap before surgery.  CHG is an antiseptic cleaner which kills germs and bonds with the skin to continue killing germs even after washing.    Oral Hygiene is also important to reduce your risk of infection.  Remember - BRUSH YOUR TEETH THE MORNING OF SURGERY WITH YOUR REGULAR TOOTHPASTE  Please do not use if you have an allergy to CHG or antibacterial soaps. If your skin becomes reddened/irritated stop using the CHG.  Do not shave (including legs and underarms) for at least 48 hours prior to first CHG shower. It is OK to shave your face.  Please follow these instructions carefully.   1. Shower the NIGHT BEFORE SURGERY and the MORNING OF SURGERY with CHG Soap.   2. If you chose to wash your hair, wash your hair first as usual  with your normal shampoo.  3. After you shampoo, rinse your hair and body thoroughly to remove the shampoo.  4. Use CHG as you would any other liquid soap. You can apply CHG directly to the skin and wash gently with a scrungie or a clean washcloth.   5. Apply the CHG Soap to your body ONLY FROM THE NECK DOWN.  Do not use on open wounds or open sores. Avoid contact with your eyes, ears, mouth and genitals (private parts). Wash Face and genitals (private parts)  with your normal  soap.   6. Wash thoroughly, paying special attention to the area where your surgery will be performed.  7. Thoroughly rinse your body with warm water from the neck down.  8. DO NOT shower/wash with your normal soap after using and rinsing off the CHG Soap.  9. Pat yourself dry with a CLEAN TOWEL.  10. Wear CLEAN PAJAMAS to bed the night before surgery, wear comfortable clothes the morning of surgery  11. Place CLEAN SHEETS on your bed the night of your first shower and DO NOT SLEEP WITH PETS.    Day of Surgery:  Do not apply any deodorants/lotions. Please shower the morning of surgery with the CHG soap  Please wear clean clothes to the hospital/surgery center.   Remember to brush your teeth WITH YOUR REGULAR TOOTHPASTE.   Please read over the following fact sheets that you were given.

## 2018-12-09 ENCOUNTER — Encounter (HOSPITAL_COMMUNITY)
Admission: RE | Admit: 2018-12-09 | Discharge: 2018-12-09 | Disposition: A | Discharge status: Home / Self Care | Payer: Medicare Other | Source: Ambulatory Visit | Attending: Surgery | Admitting: Surgery

## 2018-12-09 ENCOUNTER — Encounter (HOSPITAL_COMMUNITY)
Admission: RE | Admit: 2018-12-09 | Discharge: 2018-12-09 | Disposition: A | Discharge status: Home / Self Care | Payer: Medicare Other | Source: Ambulatory Visit | Attending: Specialist | Admitting: Specialist

## 2018-12-09 ENCOUNTER — Other Ambulatory Visit: Payer: Self-pay

## 2018-12-09 ENCOUNTER — Encounter (HOSPITAL_COMMUNITY): Payer: Self-pay

## 2018-12-09 DIAGNOSIS — I10 Essential (primary) hypertension: Secondary | ICD-10-CM | POA: Diagnosis not present

## 2018-12-09 DIAGNOSIS — Z833 Family history of diabetes mellitus: Secondary | ICD-10-CM | POA: Diagnosis not present

## 2018-12-09 DIAGNOSIS — I959 Hypotension, unspecified: Secondary | ICD-10-CM | POA: Diagnosis not present

## 2018-12-09 DIAGNOSIS — Z905 Acquired absence of kidney: Secondary | ICD-10-CM | POA: Diagnosis not present

## 2018-12-09 DIAGNOSIS — E739 Lactose intolerance, unspecified: Secondary | ICD-10-CM | POA: Diagnosis not present

## 2018-12-09 DIAGNOSIS — Z87891 Personal history of nicotine dependence: Secondary | ICD-10-CM | POA: Diagnosis not present

## 2018-12-09 DIAGNOSIS — M48061 Spinal stenosis, lumbar region without neurogenic claudication: Secondary | ICD-10-CM | POA: Diagnosis not present

## 2018-12-09 DIAGNOSIS — Z90710 Acquired absence of both cervix and uterus: Secondary | ICD-10-CM | POA: Diagnosis not present

## 2018-12-09 DIAGNOSIS — Z8249 Family history of ischemic heart disease and other diseases of the circulatory system: Secondary | ICD-10-CM | POA: Diagnosis not present

## 2018-12-09 DIAGNOSIS — F329 Major depressive disorder, single episode, unspecified: Secondary | ICD-10-CM | POA: Diagnosis not present

## 2018-12-09 DIAGNOSIS — M5116 Intervertebral disc disorders with radiculopathy, lumbar region: Secondary | ICD-10-CM | POA: Diagnosis not present

## 2018-12-09 DIAGNOSIS — Z01818 Encounter for other preprocedural examination: Secondary | ICD-10-CM | POA: Diagnosis not present

## 2018-12-09 DIAGNOSIS — Z809 Family history of malignant neoplasm, unspecified: Secondary | ICD-10-CM | POA: Diagnosis not present

## 2018-12-09 LAB — COMPREHENSIVE METABOLIC PANEL
ALT: 12 U/L (ref 0–44)
AST: 18 U/L (ref 15–41)
Albumin: 3.9 g/dL (ref 3.5–5.0)
Alkaline Phosphatase: 92 U/L (ref 38–126)
Anion gap: 8 (ref 5–15)
BUN: 8 mg/dL (ref 6–20)
CO2: 24 mmol/L (ref 22–32)
Calcium: 9.3 mg/dL (ref 8.9–10.3)
Chloride: 106 mmol/L (ref 98–111)
Creatinine, Ser: 1.18 mg/dL — ABNORMAL HIGH (ref 0.44–1.00)
GFR calc Af Amer: >60 mL/min (ref >60)
GFR calc non Af Amer: 54 mL/min — ABNORMAL LOW (ref >60)
Glucose, Bld: 73 mg/dL (ref 70–99)
Potassium: 4 mmol/L (ref 3.5–5.1)
Sodium: 138 mmol/L (ref 135–145)
Total Bilirubin: 0.6 mg/dL (ref 0.3–1.2)
Total Protein: 7.8 g/dL (ref 6.5–8.1)

## 2018-12-09 LAB — CBC
HCT: 39.8 % (ref 36.0–46.0)
Hemoglobin: 12.8 g/dL (ref 12.0–15.0)
MCH: 28.3 pg (ref 26.0–34.0)
MCHC: 32.2 g/dL (ref 30.0–36.0)
MCV: 88.1 fL (ref 80.0–100.0)
Platelets: 268 10*3/uL (ref 150–400)
RBC: 4.52 MIL/uL (ref 3.87–5.11)
RDW: 13.7 % (ref 11.5–15.5)
WBC: 7.7 10*3/uL (ref 4.0–10.5)
nRBC: 0 % (ref 0.0–0.2)

## 2018-12-09 LAB — SURGICAL PCR SCREEN
MRSA, PCR: NEGATIVE
Staphylococcus aureus: NEGATIVE

## 2018-12-09 LAB — APTT: aPTT: 33 seconds (ref 24–36)

## 2018-12-09 LAB — URINALYSIS, ROUTINE W REFLEX MICROSCOPIC
Bilirubin Urine: NEGATIVE
Glucose, UA: NEGATIVE mg/dL
Hgb urine dipstick: NEGATIVE
Ketones, ur: NEGATIVE mg/dL
Leukocytes,Ua: NEGATIVE
Nitrite: NEGATIVE
Protein, ur: NEGATIVE mg/dL
Specific Gravity, Urine: 1.02 (ref 1.005–1.030)
pH: 5 (ref 5.0–8.0)

## 2018-12-09 LAB — PROTIME-INR
INR: 1.2 (ref 0.8–1.2)
Prothrombin Time: 15.2 seconds (ref 11.4–15.2)

## 2018-12-09 LAB — TYPE AND SCREEN
ABO/RH(D): O POS
Antibody Screen: NEGATIVE

## 2018-12-09 LAB — ABO/RH: ABO/RH(D): O POS

## 2018-12-09 NOTE — H&P (Signed)
Sarah Duncan is an 53 y.o. female.   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  States that symptoms unchanged from last office visit and she is wanting to proceed with right L3-4 and L4-5 fusion as previously discussed.  Past Medical History:  Diagnosis Date  . Anemia   . Anxiety   . Arthritis   . Constipation   . Degeneration of cervical intervertebral disc   . Depression   . Fibroids   . 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 Lactose intolerance   . Palpitations   . Shortness of breath   . 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 ABDOMINAL HYSTERECTOMY  2007  . CESAREAN SECTION  11/19/1989  . 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 SPINE SURGERY  07/13/2015   Lumbar - Dr. Christia Reading  . TOOTH EXTRACTION  04/16/2017  . TUBAL LIGATION  05/09/1993    Family History  Problem Relation Age of Onset  . Diabetes Mother   . Hypertension Mother   . Diabetes Brother   . Hypertension Brother   . Cancer Father   . Endometriosis Daughter   . Diabetes Daughter   . HIV Son    Social History:  reports that she has quit smoking. Her smoking use included cigarettes. She quit after 1.00 year of use. She has never used smokeless tobacco. She reports current alcohol use of about 1.0 standard drinks of alcohol per week. She reports that she does not use drugs.  Allergies: No Known Allergies  No medications prior to admission.    Results for orders placed or performed during the hospital encounter of 12/09/18 (from the past 48 hour(s))  APTT     Status: None   Collection Time: 12/09/18  3:15 PM  Result Value Ref Range   aPTT 33 24 - 36 seconds    Comment: Performed at [facility], [place] [address] [place], [place] [address]  CBC     Status: None    Collection Time: 12/09/18  3:15 PM  Result Value Ref Range   WBC 7.7 4.0 - 10.5 K/uL   RBC 4.52 3.87 - 5.11 MIL/uL   Hemoglobin 12.8 12.0 - 15.0 g/dL   HCT 39.8 36.0 - 46.0 %   MCV 88.1 80.0 - 100.0 fL   MCH 28.3 26.0 - 34.0 pg   MCHC 32.2 30.0 - 36.0 g/dL   RDW 13.7 11.5 - 15.5 %   Platelets 268 150 - 400 K/uL   nRBC 0.0 0.0 - 0.2 %    Comment: Performed at [facility], [place] [address] [place], [place] [address]  Protime-INR     Status: None   Collection Time: 12/09/18  3:15 PM  Result Value Ref Range   Prothrombin Time 15.2 11.4 - 15.2 seconds   INR 1.2 0.8 - 1.2    Comment: (NOTE) INR goal varies based on device and disease states. Performed at [facility], [place] [address], [place], [place] [address]   Urinalysis, Routine w reflex microscopic     Status: None   Collection Time: 12/09/18  3:15 PM  Result Value Ref Range   Color, Urine YELLOW YELLOW   APPearance CLEAR CLEAR   Specific Gravity, Urine 1.020 1.005 - 1.030  pH 5.0 5.0 - 8.0   Glucose, UA NEGATIVE NEGATIVE mg/dL   Hgb urine dipstick NEGATIVE NEGATIVE   Bilirubin Urine NEGATIVE NEGATIVE   Ketones, ur NEGATIVE NEGATIVE mg/dL   Protein, ur NEGATIVE NEGATIVE mg/dL   Nitrite NEGATIVE NEGATIVE   Leukocytes,Ua NEGATIVE NEGATIVE    Comment: Performed at [facility] [address], [place], [place] [address]  Type and screen Order type and screen if day of surgery is less than 15 days from draw of preadmission visit or order morning of surgery if day of surgery is greater than 6 days from preadmission visit.     Status: None (Preliminary result)   Collection Time: 12/09/18  3:19 PM  Result Value Ref Range   ABO/RH(D) PENDING    Antibody Screen PENDING    Sample Expiration 12/23/2018,2359    Extend sample reason      NO TRANSFUSIONS OR PREGNANCY IN THE PAST 3 MONTHS Performed at [facility] [address] [place], [place] [address]    No results found.  Review of Systems   Constitutional: Negative.   HENT: Negative.   Respiratory: Negative.   Cardiovascular: Negative.   Genitourinary: Negative.   Musculoskeletal: Positive for back pain.  Skin: Negative.   Neurological: Positive for tingling.    There were no vitals taken for this visit. Physical Exam  Constitutional: She is oriented to person, place, and time. She appears well-developed. No distress.  HENT:  Head: Normocephalic and atraumatic.  Eyes: Pupils are equal, round, and reactive to light. EOM are normal.  Neck: Normal range of motion.  Cardiovascular: Normal rate.  Respiratory: Effort normal. No respiratory distress.  GI: Soft. Bowel sounds are normal. She exhibits no distension. There is no abdominal tenderness.  Musculoskeletal:        General: Tenderness present.     Comments: Musculoskeletal:     Comments: Gait is somewhat antalgic.  Bilateral lumbar paraspinal tenderness.  Negative straight leg raise.  Some tenderness over the right hip greater trochanter bursa.  Mild tenderness over the right SI joint.  Patient does have right anterior tib and EHL weakness when compared to the left.  Patient is able to actively dorsiflex her foot.  All other strength maneuvers within normal limits.  Bilateral calves are nontender.   Neurological: She is alert and oriented to person, place, and time.  Skin: Skin is warm and dry.  Psychiatric: She has a normal mood and affect.     Assessment/Plan Right L3-4 and L4-5 HNP/stenosis, back pain and right LE radiculopathy  We will proceed with  RIGHT L3-4, L4-5 TRANSFORAMINAL LUMBAR INTERBODY FUSION WITH DEPUY MPACT SCREWS, RODS, CAGE, LOCAL BONE GRAFT, VIVIGEN as scheduled.  Surgical procedure along with possible rehab and recovery time discussed.  All questions answered and patient wishes to proceed.  Benjiman Core, PA-C 12/09/2018, 3:54 PM

## 2018-12-09 NOTE — Progress Notes (Addendum)
PCP - Sarah Haws, MD Cardiologist - pt denies  Chest x-ray - 12/09/2018 at PAT appt EKG - 12/09/2018 at PAT appt  Stress Test - denies ECHO - denies  Cardiac Cath - denies  Sleep Study - denies CPAP - n/a  Fasting Blood Sugar - n/a Checks Blood Sugar _____ times a day-n/a  Blood Thinner Instructions: n/a Aspirin Instructions: n/a  Anesthesia review: No  Patient denies shortness of breath, fever, cough and chest pain at PAT appointment  Patient verbalized understanding of instructions that were given to them at the PAT appointment. Patient was also instructed that they will need to review over the PAT instructions again at home before surgery.   Coronavirus Screening  Have you experienced the following symptoms:  Cough yes/no: No Fever (>100.75F)  yes/no: No Runny nose yes/no: No Sore throat yes/no: No Difficulty breathing/shortness of breath  yes/no: No  Have you or a family member traveled in the last 14 days and where? yes/no: No   If the patient indicates "YES" to the above questions, their PAT will be rescheduled to limit the exposure to others and, the surgeon will be notified. THE PATIENT WILL NEED TO BE ASYMPTOMATIC FOR 14 DAYS.   If the patient is not experiencing any of these symptoms, the PAT nurse will instruct them to NOT bring anyone with them to their appointment since they may have these symptoms or traveled as well.   Please remind your patients and families that hospital visitation restrictions are in effect and the importance of the restrictions.

## 2018-12-09 NOTE — Progress Notes (Signed)
[facility] ([address] [place] [address] [place] [address] Phone: [phone] Fax: [phone]                 Your procedure is scheduled on Tuesday July 21st.             Report to [facility]: [facility] [address] Main Entrance "A" at 5:30 A.M., and check in at the Admitting office.             Call this number if you have problems the morning of surgery:             [phone]  Call [phone] if you have any questions prior to your surgery date Monday-Friday 8am-4pm               Remember:             Do not eat after midnight the night before your surgery  You may drink clear liquids until 4:30 the morning of your surgery.   Clear liquids allowed are: Water, Non-Citrus Juices (without pulp), Carbonated Beverages, Clear Tea, Black Coffee Only, and Gatorade. Your surgeon has prescribed a pre-surgery Ensure carbohydrate drink. Make sure to complete this by 4:30 AM the morning of surgery. Drink in one sitting, do not sip.                          Take these medicines the morning of surgery with A SIP OF WATER  busPIRone (BUSPAR) acetaminophen (TYLENOL)-if needed HYDROcodone-acetaminophen (NORCO/VICODIN)-if needed promethazine (PHENERGAN)-if needed tiZANidine (ZANAFLEX)-if needed  As of TODAY, STOP taking any Aspirin (unless otherwise instructed by your surgeon), Aleve, Naproxen, Ibuprofen, Motrin, Advil, Goody's, BC's, all herbal medications, fish oil, and all vitamins.               The Morning of Surgery             Do not wear jewelry, make-up or nail polish.             Do not wear lotions, powders, or perfumes/colognes, or deodorant             Do not shave 48 hours prior to surgery.              Do not bring valuables to the hospital.             [facility] is not responsible for any belongings or valuables.  If you are a smoker, DO NOT Smoke 24 hours prior to surgery IF you wear a CPAP at night please bring your mask,  tubing, and machine the morning of surgery   Remember that you must have someone to transport you home after your surgery, and remain with you for 24 hours if you are discharged the same day.   Contacts, glasses, hearing aids, dentures or bridgework may not be worn into surgery.    Leave your suitcase in the car.  After surgery it may be brought to your room.  For patients admitted to the hospital, discharge time will be determined by your treatment team.  Patients discharged the day of surgery will not be allowed to drive home.    Special instructions:   [facility]- Preparing For Surgery  Before surgery, you can play an important role. Because skin is not sterile, your skin needs to be as free of germs as possible. You can reduce the number of germs on your  skin by washing with CHG (chlorahexidine gluconate) Soap before surgery.  CHG is an antiseptic cleaner which kills germs and bonds with the skin to continue killing germs even after washing.    Oral Hygiene is also important to reduce your risk of infection.  Remember - BRUSH YOUR TEETH THE MORNING OF SURGERY WITH YOUR REGULAR TOOTHPASTE  Please do not use if you have an allergy to CHG or antibacterial soaps. If your skin becomes reddened/irritated stop using the CHG.  Do not shave (including legs and underarms) for at least 48 hours prior to first CHG shower. It is OK to shave your face.  Please follow these instructions carefully.                                                                                                                               1. Shower the NIGHT BEFORE SURGERY and the MORNING OF SURGERY with CHG Soap.   2. If you chose to wash your hair, wash your hair first as usual with your normal shampoo.  3. After you shampoo, rinse your hair and body thoroughly to remove the shampoo.  4. Use CHG as you would any other liquid soap. You can apply CHG directly to the skin and wash gently with a  scrungie or a clean washcloth.   5. Apply the CHG Soap to your body ONLY FROM THE NECK DOWN.  Do not use on open wounds or open sores. Avoid contact with your eyes, ears, mouth and genitals (private parts). Wash Face and genitals (private parts)  with your normal soap.   6. Wash thoroughly, paying special attention to the area where your surgery will be performed.  7. Thoroughly rinse your body with warm water from the neck down.  8. DO NOT shower/wash with your normal soap after using and rinsing off the CHG Soap.  9. Pat yourself dry with a CLEAN TOWEL.  10. Wear CLEAN PAJAMAS to bed the night before surgery, wear comfortable clothes the morning of surgery  11. Place CLEAN SHEETS on your bed the night of your first shower and DO NOT SLEEP WITH PETS.   Day of Surgery: Shower as above Do not apply any deodorants/lotions. Please shower the morning of surgery with the CHG soap  Please wear clean clothes to the hospital/surgery center.   Remember to brush your teeth WITH YOUR REGULAR TOOTHPASTE.   Please read over the following fact sheets that you were given.

## 2018-12-10 ENCOUNTER — Inpatient Hospital Stay (HOSPITAL_COMMUNITY): Payer: Medicare Other | Admitting: Certified Registered"

## 2018-12-10 ENCOUNTER — Encounter (HOSPITAL_COMMUNITY)
Admission: RE | Disposition: A | Discharge status: Home / Self Care | Payer: Self-pay | Source: Home / Self Care | Attending: Specialist

## 2018-12-10 ENCOUNTER — Inpatient Hospital Stay (HOSPITAL_COMMUNITY)
Admission: RE | Admit: 2018-12-10 | Discharge: 2018-12-13 | DRG: 460 | Disposition: A | Discharge status: Home Health | Payer: Medicare Other | Attending: Specialist | Admitting: Specialist

## 2018-12-10 ENCOUNTER — Encounter (HOSPITAL_COMMUNITY): Payer: Self-pay

## 2018-12-10 ENCOUNTER — Inpatient Hospital Stay (HOSPITAL_COMMUNITY): Payer: Medicare Other

## 2018-12-10 DIAGNOSIS — M1990 Unspecified osteoarthritis, unspecified site: Secondary | ICD-10-CM | POA: Diagnosis not present

## 2018-12-10 DIAGNOSIS — M48062 Spinal stenosis, lumbar region with neurogenic claudication: Secondary | ICD-10-CM | POA: Diagnosis not present

## 2018-12-10 DIAGNOSIS — Z8249 Family history of ischemic heart disease and other diseases of the circulatory system: Secondary | ICD-10-CM

## 2018-12-10 DIAGNOSIS — Z905 Acquired absence of kidney: Secondary | ICD-10-CM

## 2018-12-10 DIAGNOSIS — Z981 Arthrodesis status: Secondary | ICD-10-CM | POA: Diagnosis not present

## 2018-12-10 DIAGNOSIS — G8929 Other chronic pain: Secondary | ICD-10-CM | POA: Diagnosis not present

## 2018-12-10 DIAGNOSIS — M419 Scoliosis, unspecified: Secondary | ICD-10-CM | POA: Diagnosis present

## 2018-12-10 DIAGNOSIS — M48061 Spinal stenosis, lumbar region without neurogenic claudication: Secondary | ICD-10-CM | POA: Diagnosis present

## 2018-12-10 DIAGNOSIS — I959 Hypotension, unspecified: Secondary | ICD-10-CM | POA: Diagnosis not present

## 2018-12-10 DIAGNOSIS — I10 Essential (primary) hypertension: Secondary | ICD-10-CM | POA: Diagnosis present

## 2018-12-10 DIAGNOSIS — M5030 Other cervical disc degeneration, unspecified cervical region: Secondary | ICD-10-CM | POA: Diagnosis not present

## 2018-12-10 DIAGNOSIS — Z833 Family history of diabetes mellitus: Secondary | ICD-10-CM | POA: Diagnosis not present

## 2018-12-10 DIAGNOSIS — M5136 Other intervertebral disc degeneration, lumbar region: Secondary | ICD-10-CM | POA: Diagnosis not present

## 2018-12-10 DIAGNOSIS — Z809 Family history of malignant neoplasm, unspecified: Secondary | ICD-10-CM

## 2018-12-10 DIAGNOSIS — Z90710 Acquired absence of both cervix and uterus: Secondary | ICD-10-CM

## 2018-12-10 DIAGNOSIS — M5116 Intervertebral disc disorders with radiculopathy, lumbar region: Secondary | ICD-10-CM | POA: Diagnosis not present

## 2018-12-10 DIAGNOSIS — Z419 Encounter for procedure for purposes other than remedying health state, unspecified: Secondary | ICD-10-CM

## 2018-12-10 DIAGNOSIS — Z79891 Long term (current) use of opiate analgesic: Secondary | ICD-10-CM | POA: Diagnosis not present

## 2018-12-10 DIAGNOSIS — F329 Major depressive disorder, single episode, unspecified: Secondary | ICD-10-CM | POA: Diagnosis not present

## 2018-12-10 DIAGNOSIS — M5106 Intervertebral disc disorders with myelopathy, lumbar region: Secondary | ICD-10-CM

## 2018-12-10 DIAGNOSIS — Z87891 Personal history of nicotine dependence: Secondary | ICD-10-CM | POA: Diagnosis not present

## 2018-12-10 DIAGNOSIS — E739 Lactose intolerance, unspecified: Secondary | ICD-10-CM | POA: Diagnosis not present

## 2018-12-10 DIAGNOSIS — Z9181 History of falling: Secondary | ICD-10-CM | POA: Diagnosis not present

## 2018-12-10 DIAGNOSIS — F411 Generalized anxiety disorder: Secondary | ICD-10-CM | POA: Diagnosis not present

## 2018-12-10 DIAGNOSIS — R7303 Prediabetes: Secondary | ICD-10-CM | POA: Diagnosis not present

## 2018-12-10 DIAGNOSIS — E7849 Other hyperlipidemia: Secondary | ICD-10-CM | POA: Diagnosis not present

## 2018-12-10 DIAGNOSIS — M5126 Other intervertebral disc displacement, lumbar region: Secondary | ICD-10-CM | POA: Diagnosis not present

## 2018-12-10 DIAGNOSIS — E669 Obesity, unspecified: Secondary | ICD-10-CM | POA: Diagnosis not present

## 2018-12-10 DIAGNOSIS — D649 Anemia, unspecified: Secondary | ICD-10-CM | POA: Diagnosis not present

## 2018-12-10 DIAGNOSIS — K5900 Constipation, unspecified: Secondary | ICD-10-CM | POA: Diagnosis not present

## 2018-12-10 DIAGNOSIS — M51369 Other intervertebral disc degeneration, lumbar region without mention of lumbar back pain or lower extremity pain: Secondary | ICD-10-CM | POA: Diagnosis present

## 2018-12-10 DIAGNOSIS — Z6835 Body mass index (BMI) 35.0-35.9, adult: Secondary | ICD-10-CM

## 2018-12-10 DIAGNOSIS — Z4789 Encounter for other orthopedic aftercare: Secondary | ICD-10-CM | POA: Diagnosis not present

## 2018-12-10 DIAGNOSIS — Z6832 Body mass index (BMI) 32.0-32.9, adult: Secondary | ICD-10-CM | POA: Diagnosis not present

## 2018-12-10 DIAGNOSIS — E559 Vitamin D deficiency, unspecified: Secondary | ICD-10-CM | POA: Diagnosis not present

## 2018-12-10 LAB — POCT I-STAT 4, (NA,K, GLUC, HGB,HCT)
Glucose, Bld: 141 mg/dL — ABNORMAL HIGH (ref 70–99)
Glucose, Bld: 141 mg/dL — ABNORMAL HIGH (ref 70–99)
Glucose, Bld: 143 mg/dL — ABNORMAL HIGH (ref 70–99)
Glucose, Bld: 143 mg/dL — ABNORMAL HIGH (ref 70–99)
HCT: 31 % — ABNORMAL LOW (ref 36.0–46.0)
HCT: 31 % — ABNORMAL LOW (ref 36.0–46.0)
HCT: 32 % — ABNORMAL LOW (ref 36.0–46.0)
HCT: 33 % — ABNORMAL LOW (ref 36.0–46.0)
Hemoglobin: 10.5 g/dL — ABNORMAL LOW (ref 12.0–15.0)
Hemoglobin: 10.5 g/dL — ABNORMAL LOW (ref 12.0–15.0)
Hemoglobin: 10.9 g/dL — ABNORMAL LOW (ref 12.0–15.0)
Hemoglobin: 11.2 g/dL — ABNORMAL LOW (ref 12.0–15.0)
Potassium: 4 mmol/L (ref 3.5–5.1)
Potassium: 4 mmol/L (ref 3.5–5.1)
Potassium: 4.1 mmol/L (ref 3.5–5.1)
Potassium: 4.1 mmol/L (ref 3.5–5.1)
Sodium: 137 mmol/L (ref 135–145)
Sodium: 138 mmol/L (ref 135–145)
Sodium: 138 mmol/L (ref 135–145)
Sodium: 137 mmol/L (ref 135–145)

## 2018-12-10 LAB — CBC
HCT: 33 % — ABNORMAL LOW (ref 36.0–46.0)
Hemoglobin: 10.7 g/dL — ABNORMAL LOW (ref 12.0–15.0)
MCH: 28.2 pg (ref 26.0–34.0)
MCHC: 32.4 g/dL (ref 30.0–36.0)
MCV: 87.1 fL (ref 80.0–100.0)
Platelets: 247 10*3/uL (ref 150–400)
RBC: 3.79 MIL/uL — ABNORMAL LOW (ref 3.87–5.11)
RDW: 13.5 % (ref 11.5–15.5)
WBC: 9.3 10*3/uL (ref 4.0–10.5)
nRBC: 0 % (ref 0.0–0.2)

## 2018-12-10 SURGERY — POSTERIOR LUMBAR FUSION 2 LEVEL
Anesthesia: General | Site: Spine Lumbar

## 2018-12-10 MED ORDER — FENTANYL CITRATE (PF) 250 MCG/5ML IJ SOLN
INTRAMUSCULAR | Status: AC
  Filled 2018-12-10: qty 5

## 2018-12-10 MED ORDER — BUPIVACAINE HCL (PF) 0.5 % IJ SOLN
INTRAMUSCULAR | Status: AC
  Filled 2018-12-10: qty 30

## 2018-12-10 MED ORDER — SODIUM CHLORIDE 0.9 % IV SOLN: 250.0000 mL | INTRAVENOUS | Status: DC

## 2018-12-10 MED ORDER — DULOXETINE HCL 60 MG PO CPEP
60.0000 mg | ORAL_CAPSULE | Freq: Every day | ORAL | Status: DC
  Administered 2018-12-11 – 2018-12-13 (×3): 60 mg via ORAL
  Filled 2018-12-10 (×3): qty 1

## 2018-12-10 MED ORDER — CEFAZOLIN SODIUM-DEXTROSE 2-4 GM/100ML-% IV SOLN
INTRAVENOUS | Status: AC
  Filled 2018-12-10: qty 100

## 2018-12-10 MED ORDER — DOCUSATE SODIUM 100 MG PO CAPS
100.0000 mg | ORAL_CAPSULE | Freq: Two times a day (BID) | ORAL | Status: DC
  Administered 2018-12-10 – 2018-12-13 (×6): 100 mg via ORAL
  Filled 2018-12-10 (×6): qty 1

## 2018-12-10 MED ORDER — CEFAZOLIN SODIUM 1 G IJ SOLR
INTRAMUSCULAR | Status: AC
  Filled 2018-12-10: qty 20

## 2018-12-10 MED ORDER — ROCURONIUM BROMIDE 10 MG/ML (PF) SYRINGE
PREFILLED_SYRINGE | INTRAVENOUS | Status: AC
  Filled 2018-12-10: qty 10

## 2018-12-10 MED ORDER — CEFAZOLIN SODIUM-DEXTROSE 2-4 GM/100ML-% IV SOLN
2.0000 g | INTRAVENOUS | Status: AC
  Administered 2018-12-10 (×2): 2 g via INTRAVENOUS

## 2018-12-10 MED ORDER — THROMBIN 20000 UNITS EX SOLR
CUTANEOUS | Status: DC | PRN
  Administered 2018-12-10: 20 mL via TOPICAL

## 2018-12-10 MED ORDER — OXYCODONE HCL 5 MG PO TABS
10.0000 mg | ORAL_TABLET | ORAL | Status: DC | PRN
  Administered 2018-12-11 – 2018-12-13 (×13): 10 mg via ORAL
  Filled 2018-12-10 (×13): qty 2

## 2018-12-10 MED ORDER — MENTHOL 3 MG MT LOZG: 1.0000 | LOZENGE | OROMUCOSAL | Status: DC | PRN

## 2018-12-10 MED ORDER — BUSPIRONE HCL 5 MG PO TABS
15.0000 mg | ORAL_TABLET | Freq: Two times a day (BID) | ORAL | Status: DC
  Administered 2018-12-10 – 2018-12-13 (×6): 15 mg via ORAL
  Filled 2018-12-10 (×6): qty 1

## 2018-12-10 MED ORDER — PROMETHAZINE HCL 25 MG PO TABS: 12.5000 mg | ORAL_TABLET | Freq: Four times a day (QID) | ORAL | Status: DC | PRN

## 2018-12-10 MED ORDER — PROPOFOL 10 MG/ML IV BOLUS
INTRAVENOUS | Status: DC | PRN
  Administered 2018-12-10: 150 mg via INTRAVENOUS

## 2018-12-10 MED ORDER — PHENYLEPHRINE 40 MCG/ML (10ML) SYRINGE FOR IV PUSH (FOR BLOOD PRESSURE SUPPORT)
PREFILLED_SYRINGE | INTRAVENOUS | Status: AC
  Filled 2018-12-10: qty 10

## 2018-12-10 MED ORDER — ACETAMINOPHEN 650 MG RE SUPP: 650.0000 mg | RECTAL | Status: DC | PRN

## 2018-12-10 MED ORDER — OXYCODONE HCL ER 10 MG PO T12A
10.0000 mg | EXTENDED_RELEASE_TABLET | Freq: Two times a day (BID) | ORAL | Status: DC
  Administered 2018-12-10 – 2018-12-11 (×2): 10 mg via ORAL
  Filled 2018-12-10 (×2): qty 1

## 2018-12-10 MED ORDER — ACETAMINOPHEN 10 MG/ML IV SOLN
INTRAVENOUS | Status: AC
  Filled 2018-12-10: qty 100

## 2018-12-10 MED ORDER — DEXAMETHASONE SODIUM PHOSPHATE 10 MG/ML IJ SOLN
INTRAMUSCULAR | Status: DC | PRN
  Administered 2018-12-10: 10 mg via INTRAVENOUS

## 2018-12-10 MED ORDER — DEXMEDETOMIDINE HCL 200 MCG/2ML IV SOLN
INTRAVENOUS | Status: DC | PRN
  Administered 2018-12-10: 40 ug via INTRAVENOUS

## 2018-12-10 MED ORDER — FENTANYL CITRATE (PF) 100 MCG/2ML IJ SOLN
INTRAMUSCULAR | Status: DC | PRN
  Administered 2018-12-10: 100 ug via INTRAVENOUS
  Administered 2018-12-10 (×4): 50 ug via INTRAVENOUS
  Administered 2018-12-10: 100 ug via INTRAVENOUS
  Administered 2018-12-10 (×3): 50 ug via INTRAVENOUS

## 2018-12-10 MED ORDER — ONDANSETRON HCL 4 MG PO TABS: 4.0000 mg | ORAL_TABLET | Freq: Four times a day (QID) | ORAL | Status: DC | PRN

## 2018-12-10 MED ORDER — DEXMEDETOMIDINE HCL IN NACL 200 MCG/50ML IV SOLN
INTRAVENOUS | Status: AC
  Filled 2018-12-10: qty 50

## 2018-12-10 MED ORDER — ROCURONIUM BROMIDE 10 MG/ML (PF) SYRINGE
PREFILLED_SYRINGE | INTRAVENOUS | Status: DC | PRN
  Administered 2018-12-10 (×2): 20 mg via INTRAVENOUS
  Administered 2018-12-10: 50 mg via INTRAVENOUS
  Administered 2018-12-10: 10 mg via INTRAVENOUS
  Administered 2018-12-10: 20 mg via INTRAVENOUS

## 2018-12-10 MED ORDER — BISACODYL 5 MG PO TBEC: 5.0000 mg | DELAYED_RELEASE_TABLET | Freq: Every day | ORAL | Status: DC | PRN

## 2018-12-10 MED ORDER — BUPIVACAINE LIPOSOME 1.3 % IJ SUSP
20.0000 mL | Freq: Once | INTRAMUSCULAR | Status: DC
  Filled 2018-12-10: qty 20

## 2018-12-10 MED ORDER — BUPIVACAINE LIPOSOME 1.3 % IJ SUSP
INTRAMUSCULAR | Status: DC | PRN
  Administered 2018-12-10: 10 mL

## 2018-12-10 MED ORDER — CHLORHEXIDINE GLUCONATE 4 % EX LIQD: 60.0000 mL | Freq: Once | CUTANEOUS | Status: DC

## 2018-12-10 MED ORDER — ONDANSETRON HCL 4 MG/2ML IJ SOLN: 4.0000 mg | Freq: Four times a day (QID) | INTRAMUSCULAR | Status: DC | PRN

## 2018-12-10 MED ORDER — FENTANYL CITRATE (PF) 100 MCG/2ML IJ SOLN
25.0000 ug | INTRAMUSCULAR | Status: DC | PRN
  Administered 2018-12-10 (×4): 25 ug via INTRAVENOUS

## 2018-12-10 MED ORDER — ONDANSETRON HCL 4 MG/2ML IJ SOLN
INTRAMUSCULAR | Status: DC | PRN
  Administered 2018-12-10: 4 mg via INTRAVENOUS

## 2018-12-10 MED ORDER — SODIUM CHLORIDE 0.9% FLUSH: 3.0000 mL | INTRAVENOUS | Status: DC | PRN

## 2018-12-10 MED ORDER — SUGAMMADEX SODIUM 200 MG/2ML IV SOLN
INTRAVENOUS | Status: DC | PRN
  Administered 2018-12-10: 200 mg via INTRAVENOUS

## 2018-12-10 MED ORDER — DEXMEDETOMIDINE HCL IN NACL 200 MCG/50ML IV SOLN
INTRAVENOUS | Status: DC | PRN
  Administered 2018-12-10: 0.4 ug/kg/h via INTRAVENOUS

## 2018-12-10 MED ORDER — ONDANSETRON HCL 4 MG/2ML IJ SOLN
INTRAMUSCULAR | Status: AC
  Filled 2018-12-10: qty 2

## 2018-12-10 MED ORDER — KETAMINE HCL 50 MG/5ML IJ SOSY
PREFILLED_SYRINGE | INTRAMUSCULAR | Status: AC
  Filled 2018-12-10: qty 5

## 2018-12-10 MED ORDER — SODIUM CHLORIDE 0.9% FLUSH
3.0000 mL | Freq: Two times a day (BID) | INTRAVENOUS | Status: DC
  Administered 2018-12-10 – 2018-12-12 (×4): 3 mL via INTRAVENOUS

## 2018-12-10 MED ORDER — 0.9 % SODIUM CHLORIDE (POUR BTL) OPTIME
TOPICAL | Status: DC | PRN
  Administered 2018-12-10: 09:00:00 1000 mL

## 2018-12-10 MED ORDER — SODIUM CHLORIDE 0.9 % IV SOLN
INTRAVENOUS | Status: DC
  Administered 2018-12-10 – 2018-12-11 (×2): via INTRAVENOUS

## 2018-12-10 MED ORDER — SODIUM CHLORIDE 0.9 % IV SOLN
INTRAVENOUS | Status: DC | PRN
  Administered 2018-12-10: 14:00:00 via INTRAVENOUS

## 2018-12-10 MED ORDER — MIDAZOLAM HCL 2 MG/2ML IJ SOLN
INTRAMUSCULAR | Status: AC
  Filled 2018-12-10: qty 2

## 2018-12-10 MED ORDER — POLYETHYLENE GLYCOL 3350 17 G PO PACK: 17.0000 g | PACK | Freq: Every day | ORAL | Status: DC | PRN

## 2018-12-10 MED ORDER — ALBUMIN HUMAN 5 % IV SOLN
INTRAVENOUS | Status: DC | PRN
  Administered 2018-12-10 (×3): via INTRAVENOUS

## 2018-12-10 MED ORDER — ALUM & MAG HYDROXIDE-SIMETH 200-200-20 MG/5ML PO SUSP: 30.0000 mL | Freq: Four times a day (QID) | ORAL | Status: DC | PRN

## 2018-12-10 MED ORDER — MELATONIN 3 MG PO TABS
9.0000 mg | ORAL_TABLET | Freq: Every evening | ORAL | Status: DC | PRN
  Administered 2018-12-11: 01:00:00 9 mg via ORAL
  Filled 2018-12-10 (×3): qty 3

## 2018-12-10 MED ORDER — PROPOFOL 10 MG/ML IV BOLUS
INTRAVENOUS | Status: AC
  Filled 2018-12-10: qty 20

## 2018-12-10 MED ORDER — LIDOCAINE 2% (20 MG/ML) 5 ML SYRINGE
INTRAMUSCULAR | Status: AC
  Filled 2018-12-10: qty 5

## 2018-12-10 MED ORDER — MIDAZOLAM HCL 5 MG/5ML IJ SOLN
INTRAMUSCULAR | Status: DC | PRN
  Administered 2018-12-10: 2 mg via INTRAVENOUS

## 2018-12-10 MED ORDER — BUPIVACAINE HCL 0.5 % IJ SOLN
INTRAMUSCULAR | Status: DC | PRN
  Administered 2018-12-10: 10 mL

## 2018-12-10 MED ORDER — ACETAMINOPHEN 325 MG PO TABS
650.0000 mg | ORAL_TABLET | ORAL | Status: DC | PRN
  Administered 2018-12-12 – 2018-12-13 (×2): 650 mg via ORAL
  Filled 2018-12-10 (×2): qty 2

## 2018-12-10 MED ORDER — ADULT MULTIVITAMIN W/MINERALS CH
2.0000 | ORAL_TABLET | Freq: Every day | ORAL | Status: DC
  Administered 2018-12-10 – 2018-12-13 (×4): 2 via ORAL
  Filled 2018-12-10 (×4): qty 2

## 2018-12-10 MED ORDER — LIDOCAINE 2% (20 MG/ML) 5 ML SYRINGE
INTRAMUSCULAR | Status: DC | PRN
  Administered 2018-12-10: 50 mg via INTRAVENOUS

## 2018-12-10 MED ORDER — GABAPENTIN 300 MG PO CAPS
300.0000 mg | ORAL_CAPSULE | Freq: Three times a day (TID) | ORAL | Status: DC
  Administered 2018-12-10 – 2018-12-11 (×3): 300 mg via ORAL
  Filled 2018-12-10 (×3): qty 1

## 2018-12-10 MED ORDER — PHENYLEPHRINE 40 MCG/ML (10ML) SYRINGE FOR IV PUSH (FOR BLOOD PRESSURE SUPPORT)
PREFILLED_SYRINGE | INTRAVENOUS | Status: DC | PRN
  Administered 2018-12-10: 40 ug via INTRAVENOUS
  Administered 2018-12-10 (×2): 80 ug via INTRAVENOUS

## 2018-12-10 MED ORDER — PHENOL 1.4 % MT LIQD: 1.0000 | OROMUCOSAL | Status: DC | PRN

## 2018-12-10 MED ORDER — CEFAZOLIN SODIUM-DEXTROSE 2-4 GM/100ML-% IV SOLN
2.0000 g | Freq: Three times a day (TID) | INTRAVENOUS | Status: AC
  Administered 2018-12-10 – 2018-12-11 (×2): 2 g via INTRAVENOUS
  Filled 2018-12-10 (×2): qty 100

## 2018-12-10 MED ORDER — THROMBIN 20000 UNITS EX SOLR
CUTANEOUS | Status: AC
  Filled 2018-12-10: qty 20000

## 2018-12-10 MED ORDER — ACETAMINOPHEN 10 MG/ML IV SOLN
1000.0000 mg | Freq: Once | INTRAVENOUS | Status: AC
  Administered 2018-12-10: 1000 mg via INTRAVENOUS

## 2018-12-10 MED ORDER — DEXAMETHASONE SODIUM PHOSPHATE 10 MG/ML IJ SOLN
INTRAMUSCULAR | Status: AC
  Filled 2018-12-10: qty 1

## 2018-12-10 MED ORDER — TIZANIDINE HCL 4 MG PO TABS
2.0000 mg | ORAL_TABLET | Freq: Four times a day (QID) | ORAL | Status: DC | PRN
  Administered 2018-12-11 – 2018-12-13 (×7): 4 mg via ORAL
  Filled 2018-12-10 (×8): qty 1

## 2018-12-10 MED ORDER — LACTATED RINGERS IV SOLN
INTRAVENOUS | Status: DC | PRN
  Administered 2018-12-10 (×2): via INTRAVENOUS

## 2018-12-10 MED ORDER — HEMOSTATIC AGENTS (NO CHARGE) OPTIME
TOPICAL | Status: DC | PRN
  Administered 2018-12-10: 1 via TOPICAL

## 2018-12-10 MED ORDER — PANTOPRAZOLE SODIUM 40 MG IV SOLR
40.0000 mg | Freq: Every day | INTRAVENOUS | Status: DC
  Administered 2018-12-10: 40 mg via INTRAVENOUS
  Filled 2018-12-10: qty 40

## 2018-12-10 MED ORDER — FENTANYL CITRATE (PF) 100 MCG/2ML IJ SOLN
INTRAMUSCULAR | Status: AC
  Administered 2018-12-10: 25 ug via INTRAVENOUS
  Filled 2018-12-10: qty 2

## 2018-12-10 MED ORDER — OXYCODONE HCL 5 MG PO TABS
5.0000 mg | ORAL_TABLET | ORAL | Status: DC | PRN
  Administered 2018-12-10 – 2018-12-13 (×6): 5 mg via ORAL
  Filled 2018-12-10 (×5): qty 1

## 2018-12-10 MED ORDER — FLEET ENEMA 7-19 GM/118ML RE ENEM: 1.0000 | ENEMA | Freq: Once | RECTAL | Status: DC | PRN

## 2018-12-10 MED ORDER — SODIUM CHLORIDE 0.9 % IV SOLN
INTRAVENOUS | Status: DC | PRN
  Administered 2018-12-10: 25 ug/min via INTRAVENOUS

## 2018-12-10 SURGICAL SUPPLY — 84 items
BENZOIN TINCTURE PRP APPL 2/3 (GAUZE/BANDAGES/DRESSINGS) ×1 IMPLANT
BONE CANC CHIPS 20CC PCAN1/4 (Bone Implant) ×2 IMPLANT
BONE VIVIGEN FORMABLE 10CC (Bone Implant) ×2 IMPLANT
BUR MATCHSTICK NEURO 3.0 LAGG (BURR) ×2 IMPLANT
BUR RND FLUTED 2.5 (BURR) ×1 IMPLANT
BUR SABER RD CUTTING 3.0 (BURR) ×1 IMPLANT
CAGE CONCORDE LOR 9X10X23 5D (Cage) ×1 IMPLANT
CAGE LORD CONCORDE 9X8X27 5D (Cage) ×1 IMPLANT
CHIPS CANC BONE 20CC PCAN1/4 (Bone Implant) ×1 IMPLANT
COVER BACK TABLE 80X110 HD (DRAPES) ×2 IMPLANT
COVER MAYO STAND STRL (DRAPES) ×2 IMPLANT
COVER SURGICAL LIGHT HANDLE (MISCELLANEOUS) ×2 IMPLANT
DERMABOND ADVANCED (GAUZE/BANDAGES/DRESSINGS) ×1
DERMABOND ADVANCED .7 DNX12 (GAUZE/BANDAGES/DRESSINGS) ×1 IMPLANT
DRAPE C-ARM 42X72 X-RAY (DRAPES) ×4 IMPLANT
DRAPE C-ARMOR (DRAPES) ×2 IMPLANT
DRAPE MICROSCOPE LEICA (MISCELLANEOUS) ×2 IMPLANT
DRAPE SURG 17X23 STRL (DRAPES) ×8 IMPLANT
DRSG MEPILEX BORDER 4X4 (GAUZE/BANDAGES/DRESSINGS) IMPLANT
DRSG MEPILEX BORDER 4X8 (GAUZE/BANDAGES/DRESSINGS) ×1 IMPLANT
DURAPREP 26ML APPLICATOR (WOUND CARE) ×2 IMPLANT
ELECT BLADE 6.5 EXT (BLADE) ×1 IMPLANT
ELECT CAUTERY BLADE 6.4 (BLADE) ×2 IMPLANT
ELECT REM PT RETURN 9FT ADLT (ELECTROSURGICAL) ×2
ELECTRODE REM PT RTRN 9FT ADLT (ELECTROSURGICAL) ×1 IMPLANT
EVACUATOR 1/8 PVC DRAIN (DRAIN) IMPLANT
GLOVE BIOGEL PI IND STRL 6.5 (GLOVE) IMPLANT
GLOVE BIOGEL PI IND STRL 7.0 (GLOVE) IMPLANT
GLOVE BIOGEL PI IND STRL 7.5 (GLOVE) IMPLANT
GLOVE BIOGEL PI IND STRL 8 (GLOVE) ×1 IMPLANT
GLOVE BIOGEL PI INDICATOR 6.5 (GLOVE) ×1
GLOVE BIOGEL PI INDICATOR 7.0 (GLOVE) ×3
GLOVE BIOGEL PI INDICATOR 7.5 (GLOVE) ×2
GLOVE BIOGEL PI INDICATOR 8 (GLOVE) ×1
GLOVE ECLIPSE 9.0 STRL (GLOVE) ×3 IMPLANT
GLOVE ORTHO TXT STRL SZ7.5 (GLOVE) ×2 IMPLANT
GLOVE SS N UNI LF 6.5 STRL (GLOVE) ×2 IMPLANT
GLOVE SURG 8.5 LATEX PF (GLOVE) ×3 IMPLANT
GLOVE SURG SS PI 6.0 STRL IVOR (GLOVE) ×2 IMPLANT
GOWN STRL REUS W/ TWL LRG LVL3 (GOWN DISPOSABLE) ×1 IMPLANT
GOWN STRL REUS W/ TWL XL LVL3 (GOWN DISPOSABLE) IMPLANT
GOWN STRL REUS W/TWL 2XL LVL3 (GOWN DISPOSABLE) ×4 IMPLANT
GOWN STRL REUS W/TWL LRG LVL3 (GOWN DISPOSABLE) ×2
GOWN STRL REUS W/TWL XL LVL3 (GOWN DISPOSABLE) ×2
GRAFT BNE CANC CHIPS 1-8 20CC (Bone Implant) IMPLANT
GRAFT BNE MATRIX VG FRMBL L 10 (Bone Implant) IMPLANT
KIT BASIN OR (CUSTOM PROCEDURE TRAY) ×2 IMPLANT
KIT POSITION SURG JACKSON T1 (MISCELLANEOUS) ×2 IMPLANT
KIT TURNOVER KIT B (KITS) ×2 IMPLANT
MANIFOLD NEPTUNE II (INSTRUMENTS) ×2 IMPLANT
NDL SPNL 18GX3.5 QUINCKE PK (NEEDLE) ×1 IMPLANT
NEEDLE 22X1 1/2 (OR ONLY) (NEEDLE) ×2 IMPLANT
NEEDLE SPNL 18GX3.5 QUINCKE PK (NEEDLE) ×2 IMPLANT
NS IRRIG 1000ML POUR BTL (IV SOLUTION) ×2 IMPLANT
PACK LAMINECTOMY ORTHO (CUSTOM PROCEDURE TRAY) ×2 IMPLANT
PAD ARMBOARD 7.5X6 YLW CONV (MISCELLANEOUS) ×4 IMPLANT
PATTIES SURGICAL .75X.75 (GAUZE/BANDAGES/DRESSINGS) IMPLANT
PATTIES SURGICAL 1X1 (DISPOSABLE) ×1 IMPLANT
ROD EXPEDIUM PER BENT 65MM (Rod) ×2 IMPLANT
SCREW CORT FIX FEN 5.5X7X40MM (Screw) ×2 IMPLANT
SCREW SET SINGLE INNER (Screw) ×6 IMPLANT
SCREW VIPER 7X45MM (Screw) ×4 IMPLANT
SPONGE LAP 4X18 RFD (DISPOSABLE) IMPLANT
SPONGE SURGIFOAM ABS GEL 100 (HEMOSTASIS) ×2 IMPLANT
STRIP CLOSURE SKIN 1/2X4 (GAUZE/BANDAGES/DRESSINGS) ×1 IMPLANT
SURGIFLO W/THROMBIN 8M KIT (HEMOSTASIS) ×1 IMPLANT
SUT VIC AB 0 CT1 27 (SUTURE) ×1
SUT VIC AB 0 CT1 27XBRD ANBCTR (SUTURE) ×1 IMPLANT
SUT VIC AB 1 CTX 36 (SUTURE) ×2
SUT VIC AB 1 CTX36XBRD ANBCTR (SUTURE) ×2 IMPLANT
SUT VIC AB 2-0 CT1 27 (SUTURE) ×1
SUT VIC AB 2-0 CT1 TAPERPNT 27 (SUTURE) ×1 IMPLANT
SUT VIC AB 3-0 X1 27 (SUTURE) ×2 IMPLANT
SYR 20ML LL LF (SYRINGE) ×2 IMPLANT
SYR CONTROL 10ML LL (SYRINGE) ×4 IMPLANT
TAP CANN VIPER2 DL 5.0 (TAP) ×1 IMPLANT
TAP CANN VIPER2 DL 6.0 (TAP) ×1 IMPLANT
TAP CANN VIPER2 DL 7.0 (TAP) ×1 IMPLANT
TAP VIPER MIS 4.35MM (TAP) ×1 IMPLANT
TOWEL GREEN STERILE (TOWEL DISPOSABLE) ×2 IMPLANT
TOWEL GREEN STERILE FF (TOWEL DISPOSABLE) ×2 IMPLANT
TRAY FOLEY MTR SLVR 16FR STAT (SET/KITS/TRAYS/PACK) ×2 IMPLANT
WATER STERILE IRR 1000ML POUR (IV SOLUTION) ×2 IMPLANT
YANKAUER SUCT BULB TIP NO VENT (SUCTIONS) ×2 IMPLANT

## 2018-12-10 NOTE — Anesthesia Postprocedure Evaluation (Signed)
Anesthesia Post Note  [name]  Procedure(s) Performed: RIGHT LUMBAR THREE-FOUR, LUMBAR FOUR-FIVE TRANSFORAMINAL LUMBAR INTERBODY FUSION WITH DEPUY MPACT SCREWS, RODS, CAGE, LOCAL BONE GRAFT, VIVIGEN (N/A Spine Lumbar)     Patient location during evaluation: PACU Anesthesia Type: General Level of consciousness: awake and alert Pain management: pain level controlled Vital Signs Assessment: post-procedure vital signs reviewed and stable Respiratory status: spontaneous breathing, nonlabored ventilation, respiratory function stable and patient connected to nasal cannula oxygen Cardiovascular status: blood pressure returned to baseline and stable Postop Assessment: no apparent nausea or vomiting Anesthetic complications: no    Last Vitals:  Vitals:   12/10/18 1630 12/10/18 1651  BP:  107/74  Pulse: 69 66  Resp: 18 16  Temp: (!) 36.4 C (!) 36.4 C  SpO2: 99% 100%    Last Pain:  Vitals:   12/10/18 1651  TempSrc: Oral  PainSc:                  Lamon Rotundo COKER

## 2018-12-10 NOTE — Brief Op Note (Signed)
12/10/2018  2:23 PM  PATIENT:  Sarah Duncan  53 y.o. female  PRE-OPERATIVE DIAGNOSIS:  severe degenerative disc disease L4-5, right L3-4 herniated nucleus pulposus with lateral recess stenosis  POST-OPERATIVE DIAGNOSIS:  severe degenerative disc disease Lumbar 4-5, right Lumbar 3-4 herniated nucleus pulposus with lateral recess stenosis  PROCEDURE:  Procedure(s): RIGHT LUMBAR THREE-FOUR, LUMBAR FOUR-FIVE TRANSFORAMINAL LUMBAR INTERBODY FUSION WITH DEPUY MPACT SCREWS, RODS, CAGE, LOCAL BONE GRAFT, VIVIGEN (N/A)  SURGEON:  Surgeon(s) and Role:    * Jessy Oto, MD - Primary  PHYSICIAN ASSISTANT: Benjiman Core, PA-C  ANESTHESIA:   local and general  Luciana Axe, CRNFA, Roberts Gaudy, MD.  EBL:  400 mL   BLOOD ADMINISTERED:180 CC CELLSAVER  DRAINS: Urinary Catheter (Foley)   LOCAL MEDICATIONS USED:  MARCAINE 0.5% 1:1 EXPAREL 1.3%    Amount: 30 ml  SPECIMEN:  No Specimen  DISPOSITION OF SPECIMEN:  N/A  COUNTS:  YES  TOURNIQUET:  * No tourniquets in log *  DICTATION: .Dragon Dictation  PLAN OF CARE: Admit to inpatient   PATIENT DISPOSITION:  PACU - hemodynamically stable.   Delay start of Pharmacological VTE agent (>24hrs) due to surgical blood loss or risk of bleeding: yes

## 2018-12-10 NOTE — Transfer of Care (Signed)
Immediate Anesthesia Transfer of Care Note  [name]  Procedure(s) Performed: RIGHT LUMBAR THREE-FOUR, LUMBAR FOUR-FIVE TRANSFORAMINAL LUMBAR INTERBODY FUSION WITH DEPUY MPACT SCREWS, RODS, CAGE, LOCAL BONE GRAFT, VIVIGEN (N/A Spine Lumbar)  Patient Location: PACU  Anesthesia Type:General  Level of Consciousness: oriented, drowsy and patient cooperative  Airway & Oxygen Therapy: Patient Spontanous Breathing and Patient connected to face mask oxygen  Post-op Assessment: Report given to RN and Post -op Vital signs reviewed and stable  Post vital signs: Reviewed  Last Vitals:  Vitals Value Taken Time  BP 109/68 12/10/18 1456  Temp    Pulse 72 12/10/18 1503  Resp 19 12/10/18 1503  SpO2 100 % 12/10/18 1503  Vitals shown include unvalidated device data.  Last Pain:  Vitals:   12/10/18 0611  PainSc: 6          Complications: No apparent anesthesia complications

## 2018-12-10 NOTE — Anesthesia Procedure Notes (Signed)
Arterial Line Insertion Start/End7/21/2020 7:00 AM, 12/10/2018 7:15 AM Performed by: Gaylene Brooks, CRNA, CRNA  Patient location: Pre-op. Preanesthetic checklist: patient identified, IV checked, risks and benefits discussed, surgical consent, pre-op evaluation and timeout performed Lidocaine 1% used for infiltration Right, radial was placed Catheter size: 20 G Hand hygiene performed  and maximum sterile barriers used   Attempts: 1 Procedure performed without using ultrasound guided technique. Following insertion, dressing applied and Biopatch. Post procedure assessment: normal  Patient tolerated the procedure well with no immediate complications.

## 2018-12-10 NOTE — Interval H&P Note (Signed)
History and Physical Interval Note:  12/10/2018 7:32 AM  Sarah Duncan  has presented today for surgery, with the diagnosis of severe degenerative disc disease L4-5, right L3-4 herniated nucleus pulposus with lateral recess stenosis.  The various methods of treatment have been discussed with the patient and family. After consideration of risks, benefits and other options for treatment, the patient has consented to  Procedure(s): RIGHT L3-4, L4-5 TRANSFORAMINAL LUMBAR INTERBODY FUSION WITH DEPUY MPACT SCREWS, RODS, CAGE, LOCAL BONE GRAFT, VIVIGEN (N/A) as a surgical intervention.  The patient's history has been reviewed, patient examined, no change in status, stable for surgery.  I have reviewed the patient's chart and labs.  Questions were answered to the patient's satisfaction.     [name]

## 2018-12-10 NOTE — Progress Notes (Signed)
Orthopedic Tech Progress Note Patient Details:  Sarah Duncan 1966-01-31 [id number]  Patient ID: Sarah Duncan, female   DOB: 1966-04-24, 53 y.o.   MRN: [id number]   Sarah Duncan 12/10/2018, 5:13 PMCalled [employer] for Aspen lumbar brace.

## 2018-12-10 NOTE — Op Note (Signed)
12/10/2018  2:27 PM  PATIENT:  Sarah Duncan  53 y.o. female  MRN: [id number]  OPERATIVE REPORT  PRE-OPERATIVE DIAGNOSIS:  severe degenerative disc disease L4-5, right L3-4 herniated nucleus pulposus with lateral recess stenosis  POST-OPERATIVE DIAGNOSIS:  severe degenerative disc disease Lumbar 4-5, right Lumbar 3-4 herniated nucleus pulposus with lateral recess stenosis  PROCEDURE:  Procedure(s): RIGHT LUMBAR THREE-FOUR, LUMBAR FOUR-FIVE TRANSFORAMINAL LUMBAR INTERBODY FUSION WITH DEPUY MPACT SCREWS, RODS, CAGE, LOCAL BONE GRAFT, VIVIGEN    SURGEON:  Jessy Oto, MD     ASSISTANT:  Benjiman Core, PA-C  (Present throughout the entire procedure and necessary for completion of procedure in a timely manner)     ANESTHESIA:  General, supplemented with local anesthetic, marcaine 0.5% 1:1 exparel 1.3% total 30CC.     COMPLICATIONS:  None.   EBL 400 CC  DRAINS: Foley to SD.  CELL SAVER RETURNED: 180CC    COMPONENTS:   Implant Name Type Inv. Item Serial No. Manufacturer Lot No. LRB No. Used Action  BONE VIVIGEN FORMABLE 10CC - [id number] Bone Implant BONE VIVIGEN FORMABLE 10CC [id number] [employer] [place] [employer]  N/A 1 Implanted  BONE [facility] CHIPS 20CC - [id number] Bone Implant BONE CANC CHIPS 20CC [id number] [employer] [place] [employer]  N/A 1 Implanted  Concorde ProTi 5 DG Cage   DEPUY SYNTHES [id number] N/A 1 Implanted  CAGE CONCORDE LOR [id number] - [id number] Cage CAGE CONCORDE LOR 9X10X23 5D  [employer] [id number] N/A 1 Implanted  SCREW VIPER 7X45MM - [id number] Screw SCREW VIPER 7X45MM  [employer]  N/A 4 Implanted  ROD EXPEDIUM PER BENT 65MM - [id number] Rod ROD EXPEDIUM PER BENT 65MM  [employer] SP[employer]  N/A 2 Implanted  SCREW SET SINGLE INNER - [id number] Screw SCREW SET SINGLE INNER  [employer] SPINE  N/A 6 Implanted  SCREW CORT FIX FEN 5.5X7X40MM - [id number] Screw SCREW CORT FIX FEN 5.5X7X40MM  [employer] DE[employer]Y  SPINE  N/A 2 Implanted    PROCEDURE: The patient was met in the holding area, and the appropriate lumbar levels right L4-5 and L3-4 identified and marked with an "X" and my initials. I had discussion with the patient in the preop holding area regarding a change of consent form.The fusion levels are reidentified as L4-5 and L3-4. Patient understands the rationale to perform TLIFs at two levels to decompress the right L4-5 and L5-S1 lateral recess and foramenal stenosis and to allow for some improved correction of her overall kyphoscoliosis. The patient was then transported to OR and was placed under general anestheticwithout difficulty. The patient received appropriate preoperative antibiotic prophylaxis 2 gm Ancef.  Nursing staff inserted a Foley catheter under sterile conditions. The patient was then turned to a prone position using the [place] spine frame. PAS. all pressure points well padded the arms at the side to 90 90. Standard prep with DuraPrep solution draped in the usual manner from the lower dorsal spine the mid sacral segment. Iodine Vi-Drape was used and the old incision scar was marked. Time-out procedure was called and correct. Skin in the midline between L2 and S1 was then infiltrated with local anesthesia, marcaine 1/2% 1:1 exparel 1.3% total 20 cc used. Incision was then made  extending from L3-L5  through the skin and subcutaneous layers down to the patient's lumbodorsal fascia and spinous processes. The incision then carried sharply excising the supraspinous ligament and then continuing the lateral aspect of the spinous processes of  L3, L4 and  L5. Cobb elevator used to carefully elevate the paralumbar muscles off of the posterior elements using electrocautery carefully drilled bleeding and perform dissection of the muscle tissues of the preserving the facet capsule at the L2-3. Continuing the exposure out laterally to expose the lateral margin of the facet joint line at L3-4 and L4-5 .  Incision was carried in the midline down to the S1 level area bleeders controlled using electrocautery monopolar electrocautery.  The Insight self retaining retractor was used for the upper part of the incision and the cerebellar retractor distally. C-arm fluoroscopy was then brought into the field and using C-arm fluoroscopy then a hole made into the medial aspect of the pedicle of L3 observed in the pedicle using C arm at the 5 oclock position on the left L3 pedicle nerve probe initial entry was determined on fluoroscopy to be good position alignment so that a 4.95m tap was passed to 30 mm within the left L3 pedicle to a depth of nearly 40 mm observed on C-arm fluoroscopy to be beyond the midpoint of the lumbar vertebra and then position alignment within the left L3 pedicle this was then removed and the pedicle channel probed demonstrating patency no sign of rupture the cortex of the pedicle. Tapping with a 4 mm screw tap then 5 mm tap, then a 6.0 and 7.0 mm taps then 7.0 mm x 45 mm screw was placed on the left side at the L3 level. C-arm fluoroscopy was then brought into the field and using C-arm fluoroscopy then a hole made into the posterior medial aspect of the pedicle of right L3 observed in the pedicle using ball tipped nerve hook and hockey stick nerve probe initial entry was determined on fluoroscopy to be good position alignment so that 4.029mtap was then used to tap the right L3 pedicle to a depth of nearly 40 mm observed on C-arm fluoroscopy to be beyond the midpoint of the lumbar vertebra and then position alignment within the right L3 pedicle this was then removed and the pedicle channel probed demonstrating patency no sign of rupture the cortex of the pedicle. Tapping with a 5 mm screw tap, then a 6.0 and 7.0 mm taps  then 7.0 mm x 45 mm screw was placed on the table to be placed following  decompression and TLIF. C-arm fluoroscopy was then brought into the field and using C-arm fluoroscopy then a  hole made into the posterior and medial aspect of the left pedicle of L4 observed in the pedicle using ball tipped nerve hook and hockey stick nerve probe initial entry was determined on fluoroscopy to be good position alignment so that a 4.45m87map was then used to tap the left L4 pedicle to a depth of nearly 30 mm observed on C-arm fluoroscopy to be beyond the posterior one third of the lumbar vertebra and good position alignment within the left L4 pedicle this was then removed and the pedicle channel probed demonstrating patency no sign of rupture the cortex of the pedicle. Tapping with a 5 mm screw tap, then a 6.0 and 7.0 mm taps  then 7.45mm34m45 mm screw was placed on the left side at the L4 level. The pedicle channel of L4 on the left probed demonstrating patency no sign of rupture the cortex of the pedicle. Viper screw for fixation of this level was measured as 7.0 mm x 45 mm screw so  was placed on the left side at the L4 level. C-arm fluoroscopy was then  brought into the field and using C-arm fluoroscopy then a hole made into the posterior and medial aspect of the right pedicle of L4 observed in the pedicle using ball tipped nerve hook and hockey stick nerve probe initial entry was determined on fluoroscopy to be good position alignment so that a 4.14m tap was then used to tap the right L4 pedicle to a depth of nearly 30 mm observed on C-arm fluoroscopy to be beyond the posterior one third of the lumbar vertebra and good position alignment within the right L4 pedicle this was then removed and the pedicle channel probed demonstrating patency no sign of rupture the cortex of the pedicle. Tapping with a 5 mm scre, then a 6.0 and 7.0 mm taps w then 5.065mx 30 mm screw was placed on the right side at the L5 level. The pedicle channel of L4 on the right probed demonstrating patency no sign of rupture the cortex of the pedicle. Viper screw for fixation of this level was measured as 7.0 mm x 45 mm screw left for  insertion after decompression and TLIF. C-arm fluoroscopy was then brought into the field and using C-arm fluoroscopy then the hole into the pedicle of left L5 was placed and observed with ball-tipped probe the L5 pedicle on this side was 7.0 mm x 40 mm. A 7.22m68m 422m922mrefully passed down the center of the L5 pedicle to a depth of nearly 40 mm. Observed on C-arm fluoroscopy to be in good position alignment channel was probed with a ball-tipped probe ensure patency no sign of cortical disruption. Following tapping with a 5 mm tap then a 6.22mm 14m 7.22mm t85mand a 6.0 x 40 mm screw was placed on the left side pedicle at L5. C-arm fluoroscopy was used to localize the hole made in the medial aspect of the pedicle of L5 on the right localizing the pedicle within the spinal canal with nerve hook and hockey-stick nerve probe carefully passed down the center of the L5 pedicle to a depth of nearly 45 mm. Observed on C-arm fluoroscopy to be in good position alignment channel was probed with a ball-tipped probe ensure predicle screw  to be placed on the right side at the L5 level following decompression and TLIFs..C-arm fluoroscopy was used to localize the hole made in the lateral aspect of the pedicle of S1 on the right localizing the pedicle within the spinal canal with nerve hook and hockey-stick nerve probe re patency no sign of cortical disruption. Following tapping with a 4 mm, 5mm an52m mm taps a 7.0 x 40 mm screw was placed on the table to be inserted following decompression and TLIF. Flow Seal was used for hemostasis of the right L3,L4 and L5 pedicle screw holes.  The right side of the spinous processes of  L4 and L3 were then resected down to the base the right lateral wall of the lamina at each segment the lower 50% of the right lamina of L3 and of L4 was resected and Leksell rongeur used to resect inferior aspect of the lamina on the right side of the lumbar thecal sac at  L4-5 and L3-4 and decompress the right  L3, L4 and L5 neuroforamen. Osteotomes and 2mm and53mm kerr42mns were used for this portion of the decompression complete facetectomies were perform on the right at L4-5 and L3-4 to provide for exposure of the right side L4-5 and L3-4 neuroforamen for ease of placement of TLIFs (transforaminal lumbar interbody fusion) at  each level inferior portions of the lamina and pars were also resected first beginning with the Leksell rongeur and osteotomes and then resecting using 2 and 3 mm Kerrison. Continued laminectomy was carried out resecting the central portions of the right sided lamina of L3 and L4 performing foraminotomies on the right side at the L3, L4 and L5 levels. The inferior articular process L4 and L3 were resected on the right side. The L5 nerve root identified bilaterally and the medial aspect of the L5 pedicle. Superior articular process of L5 was then resected from the right side further decompressing the right L5 nerve and providing for exposure of the area just superior to the L5 pedicle for a placement of L4-5 cage. A large amount of hypertrophic ligmentum flavum was found impressing on the right lateral recesses at L4-5 and L3-4 and narrowing the respective L4 and L5 neuroforamen.  OR microscope sterilely draped was used during this portion procedure. Attention then turned to placement of the transforaminal lumbar interbody fusion cages. Using a Penfield 4 the right lateral aspect of the thecal sac at the L4-5 disc space was carefully freed up The thecal sac could then easily be retracted in the posterior lateral aspect of the L4-5 disc was exposed 15 blade scalpel used to incise the posterolateral disc and an osteotome used to resect a small portion of bone off the superior aspect of the posterior superior vertebral body of L5 in order to ease the entry into the L4-5 disc space. A  43m kerrison rongeur was then able to be introduced in the disc space debrided it was quite narrow. 7 mm dilator was  used to dialate the L4-5 disc space on the right side attempts were made to dilate further the 8 mm were successful and using small curettes and the disc space was debrided a minimal degenerative disc present in the endplates debrided to bleeding endplate bone. Shavers were inserted to trial the intervertebral disc space. A 8 mm x 29mDepuy Pro Ti cage was carefully packed with morcellized bone graft and the been harvested from previous laminotomies.The cage was then inserted with the insertion handle.  Additional vivigen and allograft bone graft and local bone graft was then packed into the intervertebral disc space. Bleeding controlled using bipolar electrocautery thrombin soaked gel cottonoids. Then turned to the right L3-4 level similarly the exposure the posterior lateral aspect this was carried out using a Penfield 4 bipolar electrocautery to control small bleeders present. Derricho retractor used to retract the thecal sac and L3 nerve root a 15 blade scalpel was used to incise posterior lateral aspect of the right L3-4 disc the disc space at this level showed a rather severe narrowing posteriorly was more open anteriorly so that an osteotome again was used to resect a small portion the posterior superior lip of the vertebral body at L4  in order to gain ease of access into the L3-4 disc space. The space was debrided of degenerative disc material using pituitary along root the entire disc space was then debrided of degenerative disc material using pituitary rongeurs curettage down to bleeding bone endplates. 59m21m8 mm and 9 mm shavers were used to debride the disc space and pituitary ronguers used to remove the loosened debris. This space was then carefully assess using spacers  a 10.0mm74mial cage provided the best fit, the Depuy concorde bullet lordotic cage 10 mm x 23mm60mchosen so that the permanent 10.0 mm cage by 27 mm Lordotic Pro Ti concorde  cage packed with local bone graft was placed into the  intervertebral disc space. The posterior intervertebral disc space was then packed with autogenous local bone graft that been harvested from the central laminectomy. Bleeding controlled using bipolar electrocautery.  Observed on C-arm fluoroscopy to be in good position alignment. The cages at L4-5 and L3-4 were placed anteriorly as best as possible to restore the patient's lordosis that was present. With this then the transforaminal lumbar interbody fusion portion of the case was completed bleeders were controlled using bipolar electrocautery thrombin-soaked Gelfoam were appropriate.Decortication of the facet joints carried out bilateral L4-5 and L3-4. These were packed with cancellous local bone graft. 3 pedicle screws on the left and the right L3 pedicle screw already in place, the 2 viper corticofixation screws on the right were each placed and then each fastener carefully aligned  to allow for placement of rods. The right side first quarter inch titanium rod was then carefully contoured using the french benders and a precontoured 76m rod. This was then placed into the pedicle screws on the right extending from L3-L5 each of the caps were carefully placed loosely tightened. Attention turned to the left side were similarly and then screws were carefully adjusted to allow for a better pattern screws to allow for placement of fixation of the rod a quarter inch 75 mm precontoured titanium rod was then carefully contoured. This was able to be inserted into the left pedicle screw fasteners, Caps onto the L3 fasteners were tightened to 80 foot lbs. Across the right side  L3-4 and L4-5 screw fasteners compression was obtained on the right side between L3 and L4, then L4 and L5 by compressing between the fasteners and tightening the screw caps 85 pounds. Similarly this was done on the left side at L3-4 and L4-5 obtaining compression and tightened 85 pounds. Irrigation was carried out with copious amounts of saline  solution this was done throughout the case. Cell Saver was used during the case with estimated blood loss of 400cc, 180cc of cell saver blood. Hockey stick neuroprobe was used to probe the neuroforamen bilateral L3, L4 and L5, these were determined to be well decompressed. Permanent C-arm images were obtained in AP and lateral plane and oblique planes. Remaining local bone graft was then applied along both lateral posterior lateral region extending from L3 to L5 facet beds.Gelfoam was then removed spinal canal. The lumbodorsal musculature carefully exam debrided of any devitalized tissue following removal of Viper retractors were the bleeders were controlled using electrocautery and the area dorsal lumbar muscle were then approximated in the midline with interrupted #1 Vicryl sutures loose the dorsal fascia was reattached to the spinous process of L2 to superiorly and L5  inferiorly this was done with #1 Vicryl sutures. Subcutaneous layers then approximated using interrupted 0 Vicryl sutures and 2-0 Vicryl sutures. Skin was closed with a running subcutaneous stitch of 4-0 Vicryl Dermabond was applied then MedPlex bandage. All instrument and sponge counts were correct. The patient was then returned to a supine position on her bed reactivated extubated and returned to the recovery room in satisfactory condition.   JBenjiman CorePA-C perform the duties of assistant surgeon during this case.He was present from the beginning of the case to the end of the case assisting in transfer the patient from his stretcher to the OR table and back to the stretcher at the end of the case. Assisted in careful retraction and suction of the laminectomy site delicate neural structures operating under  the operating room microscope. He performed closure of the incision from the fascia to the skin applying the dressing.         [name]  12/10/2018, 2:27 PM

## 2018-12-10 NOTE — Progress Notes (Signed)
1750 Received pt from PACU, A&O x4. Lumbar incision with Mipilex dressing dry and intact. Pt with good and equal strengths to BUE and BLE, denies numbness, no tingling.  Taught pt on her back precautions, no twisting, no bending, no arching. Pain meds given as needed.

## 2018-12-10 NOTE — Anesthesia Procedure Notes (Signed)
Procedure Name: Intubation Date/Time: 12/10/2018 7:59 AM Performed by: Jenne Campus, CRNA Pre-anesthesia Checklist: Patient identified, Emergency Drugs available, Suction available and Patient being monitored Patient Re-evaluated:Patient Re-evaluated prior to induction Oxygen Delivery Method: Circle System Utilized Preoxygenation: Pre-oxygenation with 100% oxygen Induction Type: IV induction Ventilation: Mask ventilation without difficulty Laryngoscope Size: Miller and 3 Grade View: Grade I Tube type: Oral Tube size: 7.5 mm Number of attempts: 1 Airway Equipment and Method: Stylet and Oral airway Placement Confirmation: ETT inserted through vocal cords under direct vision,  positive ETCO2 and breath sounds checked- equal and bilateral Secured at: 20 cm Tube secured with: Tape Dental Injury: Teeth and Oropharynx as per pre-operative assessment

## 2018-12-10 NOTE — Anesthesia Preprocedure Evaluation (Addendum)
Anesthesia Evaluation  Patient identified by MRN, date of birth, ID band Patient awake    Reviewed: Allergy & Precautions, NPO status , Patient's Chart, lab work & pertinent test results  Airway Mallampati: II  TM Distance: >3 FB Neck ROM: Full    Dental  (+) Partial Upper, Dental Advisory Given   Pulmonary former smoker,    breath sounds clear to auscultation       Cardiovascular hypertension,  Rhythm:Regular Rate:Normal     Neuro/Psych    GI/Hepatic negative GI ROS, Neg liver ROS,   Endo/Other  Morbid obesity  Renal/GU Kidney donated to cousin.     Musculoskeletal   Abdominal   Peds  Hematology   Anesthesia Other Findings   Reproductive/Obstetrics                            Anesthesia Physical Anesthesia Plan  ASA: III  Anesthesia Plan: General   Post-op Pain Management:    Induction: Intravenous  PONV Risk Score and Plan: Ondansetron and Dexamethasone  Airway Management Planned: Oral ETT  Additional Equipment:   Intra-op Plan:   Post-operative Plan: Extubation in OR  Informed Consent: I have reviewed the patients History and Physical, chart, labs and discussed the procedure including the risks, benefits and alternatives for the proposed anesthesia with the patient or authorized representative who has indicated his/her understanding and acceptance.     Dental advisory given  Plan Discussed with: CRNA and Anesthesiologist  Anesthesia Plan Comments:         Anesthesia Quick Evaluation

## 2018-12-11 LAB — CBC
HCT: 30.7 % — ABNORMAL LOW (ref 36.0–46.0)
Hemoglobin: 9.9 g/dL — ABNORMAL LOW (ref 12.0–15.0)
MCH: 28.2 pg (ref 26.0–34.0)
MCHC: 32.2 g/dL (ref 30.0–36.0)
MCV: 87.5 fL (ref 80.0–100.0)
Platelets: 208 10*3/uL (ref 150–400)
RBC: 3.51 MIL/uL — ABNORMAL LOW (ref 3.87–5.11)
RDW: 13.7 % (ref 11.5–15.5)
WBC: 11 10*3/uL — ABNORMAL HIGH (ref 4.0–10.5)
nRBC: 0 % (ref 0.0–0.2)

## 2018-12-11 LAB — BASIC METABOLIC PANEL
Anion gap: 6 (ref 5–15)
BUN: 5 mg/dL — ABNORMAL LOW (ref 6–20)
CO2: 24 mmol/L (ref 22–32)
Calcium: 8.5 mg/dL — ABNORMAL LOW (ref 8.9–10.3)
Chloride: 110 mmol/L (ref 98–111)
Creatinine, Ser: 1.07 mg/dL — ABNORMAL HIGH (ref 0.44–1.00)
GFR calc Af Amer: >60 mL/min (ref >60)
GFR calc non Af Amer: >60 mL/min (ref >60)
Glucose, Bld: 150 mg/dL — ABNORMAL HIGH (ref 70–99)
Potassium: 3.8 mmol/L (ref 3.5–5.1)
Sodium: 140 mmol/L (ref 135–145)

## 2018-12-11 LAB — CORTISOL-AM, BLOOD: Cortisol - AM: 11.1 ug/dL (ref 6.7–22.6)

## 2018-12-11 MED ORDER — SODIUM CHLORIDE 0.9 % IV BOLUS
500.0000 mL | Freq: Once | INTRAVENOUS | Status: AC
  Administered 2018-12-11: 10:00:00 via INTRAVENOUS

## 2018-12-11 MED ORDER — OXYCODONE HCL ER 15 MG PO T12A
15.0000 mg | EXTENDED_RELEASE_TABLET | Freq: Two times a day (BID) | ORAL | Status: DC
  Administered 2018-12-11 – 2018-12-13 (×4): 15 mg via ORAL
  Filled 2018-12-11 (×5): qty 1

## 2018-12-11 MED ORDER — TRAMADOL HCL 50 MG PO TABS: 50.0000 mg | ORAL_TABLET | Freq: Four times a day (QID) | ORAL | Status: DC | PRN

## 2018-12-11 MED ORDER — SODIUM CHLORIDE 0.9 % IV BOLUS: 500.0000 mL | Freq: Once | INTRAVENOUS | Status: DC | PRN

## 2018-12-11 MED ORDER — PANTOPRAZOLE SODIUM 40 MG PO TBEC
40.0000 mg | DELAYED_RELEASE_TABLET | Freq: Every day | ORAL | Status: DC
  Administered 2018-12-11 – 2018-12-12 (×2): 40 mg via ORAL
  Filled 2018-12-11 (×2): qty 1

## 2018-12-11 MED FILL — Heparin Sodium (Porcine) Inj 1000 Unit/ML: INTRAMUSCULAR | Qty: 30 | Status: AC

## 2018-12-11 MED FILL — Sodium Chloride IV Soln 0.9%: INTRAVENOUS | Qty: 2000 | Status: AC

## 2018-12-11 NOTE — Significant Event (Signed)
Rapid Response Event Note  Overview: Time Called: 0416 Arrival Time: 0422 Event Type: Hypotension(SBP 70s)  Initial Focused Assessment: Upon arrival, Sarah Duncan would wake easily to voice, oriented x4 and intermittently moaning due to soreness. HR 74, BP 75/45 (55), RR 16 with sats 100% on RA. Skin is warm, pink and dry. No c/o of nausea or SOB. Pt has chronic opiate use and wakes easily so Narcan was not administered. Hgb 9.9 this am. NS bolus 500cc initiated. Pt received multiple doses of oxycontin 10mg  during the night and most recently at 0339.   Interventions: -NS 500cc Bolus  Plan of Care (if not transferred): -Recheck VS when bolus completed -Monitor for further hypotension or mental status change -Utilize narcan if pt becomes more symptomatic -Notify primary svc of events and further orders -Notify primary svc and/or RRRN for further assistance   Event Summary: Follow up @0530 : HR 75, 87/56 (76), RR 16  Outcome: Stayed in room and stabalized  Call initiated [phone] Call ended 0532  Sarah Duncan

## 2018-12-11 NOTE — TOC Initial Note (Signed)
Transition of Care [facility]) - Initial/Assessment Note    Patient Details  Name: Sarah Duncan MRN: [id number] Date of Birth: [birth year]  Transition of Care [facility]) CM/SW Contact:    Marilu Favre, RN Phone Number: 12/11/2018, 11:26 AM  Clinical Narrative:                 Confirmed face sheet information with patient. Patient from home with wife. Walker and 3 in 1 called for.   Expected Discharge Plan: [place] Barriers to Discharge: Continued Medical Work up   Patient Goals and CMS Choice Patient states their goals for this hospitalization and ongoing recovery are:: to go home CMS Medicare.gov Compare Post Acute Care list provided to:: Patient Choice offered to / list presented to : Patient  Expected Discharge Plan and Services Expected Discharge Plan: [place]   Discharge Planning Services: CM Consult Post Acute Care Choice: Home Health, Durable Medical Equipment Living arrangements for the past 2 months: Single Family Home Expected Discharge Date: 12/13/18               DME Arranged: 3-N-1, Gilford Rile rolling DME Agency: [facility] Date DME Agency Contacted: 12/11/18 Time DME Agency Contacted: [id number] Representative spoke with at DME Agency: [place]: PT [place]: [place] Date [place]: 12/11/18 Time [place]: 1 Representative spoke with at [place]: Tommi Rumps  Prior Living Arrangements/Services Living arrangements for the past 2 months: [place] Lives with:: Spouse Patient language and need for interpreter reviewed:: Yes Do you feel safe going back to the place where you live?: Yes      Need for Family Participation in Patient Care: Yes (Comment) Care giver support system in place?: Yes (comment)   Criminal Activity/Legal Involvement Pertinent to Current Situation/Hospitalization: No - Comment as needed  Activities of Daily Living Home Assistive Devices/Equipment:  Eyeglasses, Contact lenses ADL Screening (condition at time of admission) Patient's cognitive ability adequate to safely complete daily activities?: Yes Is the patient deaf or have difficulty hearing?: No Does the patient have difficulty seeing, even when wearing glasses/contacts?: No Does the patient have difficulty concentrating, remembering, or making decisions?: No Patient able to express need for assistance with ADLs?: Yes Does the patient have difficulty dressing or bathing?: No Independently performs ADLs?: Yes (appropriate for developmental age) Does the patient have difficulty walking or climbing stairs?: No Weakness of Legs: None Weakness of Arms/Hands: None  Permission Sought/Granted   Permission granted to share information with : Yes, Verbal Permission Granted     Permission granted to share info w AGENCY: [facility]        Emotional Assessment Appearance:: Appears stated age Attitude/Demeanor/Rapport: Engaged Affect (typically observed): Accepting Orientation: : Oriented to Self, Oriented to Place, Oriented to  Time, Oriented to Situation Alcohol / Substance Use: Not Applicable Psych Involvement: No (comment)  Admission diagnosis:  severe degenerative disc disease L4-5, right L3-4 herniated nucleus pulposus with lateral recess stenosis Patient Active Problem List   Diagnosis Date Noted  . S/P lumbar spinal fusion 12/10/2018  . Degenerative disc disease, lumbar 12/10/2018    Class: Chronic  . Spinal stenosis of lumbar region 12/10/2018    Class: Chronic  . Surgery, elective   . Generalized anxiety disorder 09/07/2017  . Essential hypertension 07/31/2017  . Other hyperlipidemia 03/06/2017  . Class 2 obesity with serious comorbidity and body mass index (BMI) of 35.0 to 35.9 in adult 01/08/2017  .  Other chronic pain 12/04/2016  . Prediabetes 10/18/2016  . Vitamin D deficiency 10/18/2016  . Depression 11/23/2015  . 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 Chronic lower  back pain 06/18/2015   PCP:  Bartholome Bill, MD Pharmacy:   [facility] [place] [place] - [address] [place] ([place]) [place] [address] Phone: [phone] Fax: [phone]     Social Determinants of Health (SDOH) Interventions    Readmission Risk Interventions No flowsheet data found.

## 2018-12-11 NOTE — Progress Notes (Signed)
Occupational Therapy Evaluation Patient Details Name: Sarah Duncan MRN: [id number] DOB: May 06, 1966 Today's Date: 12/11/2018    History of Present Illness Pt is a 53 y.o. female s/p TLIF.    Clinical Impression   Pt presents with above diagnosis. PTA pt PLOF independent in all ADLs, previously working as a Theme park manager at [employer]. Pt currently requires assist with LB ADLs, functional mobility, and functional transfers due to pain and limitations presenting from surgery. Upon arrival pt complained of severe pain but agreeable to evaluation and to sit at EOB. Pt able to state 3/3 back precautions and educated on log roll to enter and exit bed. Pt will benefit from continued actue OT to address doff/don of brace, safe engagement in ADLs, and pain management to maximize independence prior to dc to [place].    Follow Up Recommendations  Home health OT;Supervision/Assistance - 24 hour    Equipment Recommendations  3 in 1 bedside commode    Recommendations for Other Services       Precautions / Restrictions Precautions Precautions: Back;Other (comment) Precaution Booklet Issued: Yes (comment) Precaution Comments: reviewed back precautions. Check BP due to post-surgical hypotension. Required Braces or Orthoses: Spinal Brace Spinal Brace: Lumbar corset;Applied in sitting position Restrictions Weight Bearing Restrictions: No      Mobility Bed Mobility Overal bed mobility: Needs Assistance Bed Mobility: Sit to Sidelying;Rolling Rolling: Min assist Sidelying to sit: Min assist     Sit to sidelying: Min assist General bed mobility comments: pt educated of log roll to sit at EOB. Min A to manage BLE and trunk. VCs for sequencing.   Transfers Overall transfer level: Needs assistance               General transfer comment: transfered deferred due to level of pain.     Balance Overall balance assessment: Needs assistance Sitting-balance support: Bilateral  upper extremity supported;Feet supported Sitting balance-Leahy Scale: Fair Sitting balance - Comments: BUE due to pain   Standing balance support: Bilateral upper extremity supported;During functional activity Standing balance-Leahy Scale: Poor Standing balance comment: reliant on RW                           ADL either performed or assessed with clinical judgement   ADL Overall ADL's : Needs assistance/impaired Eating/Feeding: Set up;Sitting   Grooming: Set up;Sitting   Upper Body Bathing: Supervision/ safety;Sitting   Lower Body Bathing: Moderate assistance   Upper Body Dressing : Supervision/safety;Sitting   Lower Body Dressing: Moderate assistance                 General ADL Comments: Pt limited by pain, session focused EOB, don of brace, eating at EOB.      Vision         Perception     Praxis      Pertinent Vitals/Pain Pain Assessment: 0-10 Pain Score: 10-Worst pain ever Pain Location: sx site Pain Descriptors / Indicators: Grimacing;Guarding;Moaning;Tightness Pain Intervention(s): Repositioned;Monitored during session;Premedicated before session     Hand Dominance     Extremity/Trunk Assessment Upper Extremity Assessment Upper Extremity Assessment: Overall WFL for tasks assessed   Lower Extremity Assessment Lower Extremity Assessment: Defer to PT evaluation       Communication Communication Communication: No difficulties   Cognition Arousal/Alertness: Awake/alert Behavior During Therapy: WFL for tasks assessed/performed Overall Cognitive Status: Within Functional Limits for tasks assessed  General Comments  BP supine: 104/58, EOB: 108/66    Exercises     Shoulder Instructions      Home Living Family/patient expects to be discharged to:: Private residence Living Arrangements: Spouse/significant other Available Help at Discharge: Family;Available 24 hours/day Type of  Home: House Home Access: Stairs to enter [employer] of Steps: 1 (threshold)   Home Layout: One level     Bathroom Shower/Tub: Corporate investment banker: Standard     Home Equipment: Toilet riser          Prior Functioning/Environment Level of Independence: Independent        Comments: worked as a Theme park manager at [employer].        OT Problem List: Decreased activity tolerance;Decreased safety awareness;Decreased knowledge of use of DME or AE;Decreased knowledge of precautions;Pain      OT Treatment/Interventions: Self-care/ADL training;Therapeutic exercise;DME and/or AE instruction;Therapeutic activities;Patient/family education;Balance training    OT Goals(Current goals can be found in the care plan section) Acute Rehab OT Goals Patient Stated Goal: home OT Goal Formulation: With patient Time For Goal Achievement: 12/25/18 Potential to Achieve Goals: Good  OT Frequency: Min 2X/week   Barriers to D/C:            Co-evaluation              AM-PAC OT "6 Clicks" Daily Activity     Outcome Measure Help from another person eating meals?: A Little Help from another person taking care of personal grooming?: A Little Help from another person toileting, which includes using toliet, bedpan, or urinal?: A Little Help from another person bathing (including washing, rinsing, drying)?: A Lot Help from another person to put on and taking off regular upper body clothing?: A Little Help from another person to put on and taking off regular lower body clothing?: A Lot 6 Click Score: 16   End of Session Equipment Utilized During Treatment: Back brace Nurse Communication: Precautions  Activity Tolerance: Patient limited by pain Patient left: in bed;with call bell/phone within reach  OT Visit Diagnosis: Pain;Unsteadiness on feet (R26.81)                Time: 6962-9528 OT Time Calculation (min): 25 min Charges:  OT General Charges $OT  Visit: 1 Visit OT Evaluation $OT Eval Low Complexity: 1 Low OT Treatments $Self Care/Home Management : 8-22 mins  Minus Breeding, MSOT, OTR/L  Supplemental Rehabilitation Services  [phone]   Marius Ditch 12/11/2018, 4:05 PM

## 2018-12-11 NOTE — Progress Notes (Signed)
Patient's blood pressure was 75/46 @ 0400 hours Patient alert and easy to arouse.Rapid response nurse called gave order to give 500 ML bolus of normal saline now. M.D. on call notified orders were to continue to monitor patient's mental status and urinary output and inform if there were any changes.

## 2018-12-11 NOTE — Progress Notes (Signed)
     Subjective: 1 Day Post-Op Procedure(s) (LRB): RIGHT LUMBAR THREE-FOUR, LUMBAR FOUR-FIVE TRANSFORAMINAL LUMBAR INTERBODY FUSION WITH DEPUY MPACT SCREWS, RODS, CAGE, LOCAL BONE GRAFT, VIVIGEN (N/A)Awake, alert and oriented x 4. "They don't use a PCA any more?" Moderate pain. Low blood pressure prevent strong narcotics. Taking cymbalta and buspar for depression. We started gabapentin and tramadol. Tolerating pos, out of bed to a recliner.   Patient reports pain as moderate.    Objective:   VITALS:  Temp:  [97 F (36.1 C)-98.3 F (36.8 C)] 98.3 F (36.8 C) (07/22 0400) Pulse Rate:  [62-86] 86 (07/22 0953) Resp:  [14-22] 16 (07/22 0400) BP: (75-118)/(45-82) 93/60 (07/22 1130) SpO2:  [97 %-100 %] 100 % (07/22 0809) Arterial Line BP: (83-116)/(45-69) 116/69 (07/21 1630)  Neurologically intact ABD soft Neurovascular intact Sensation intact distally Intact pulses distally Dorsiflexion/Plantar flexion intact Incision: dressing C/D/I and no drainage No cellulitis present   LABS Recent Labs    12/10/18 1158 12/10/18 1206 12/10/18 1222 12/10/18 1225 12/11/18 0212  HGB 10.5* 10.9* 11.2* 10.7* 9.9*  WBC  --   --   --  9.3 11.0*  PLT  --   --   --  247 208   Recent Labs    12/09/18 1515  12/10/18 1222 12/11/18 0212  NA 138   < > 137 140  K 4.0   < > 4.1 3.8  CL 106  --   --  110  CO2 24  --   --  24  BUN 8  --   --  5*  CREATININE 1.18*  --   --  1.07*  GLUCOSE 73   < > 143* 150*   < > = values in this interval not displayed.   Recent Labs    12/09/18 1515  INR 1.2     Assessment/Plan: 1 Day Post-Op Procedure(s) (LRB): RIGHT LUMBAR THREE-FOUR, LUMBAR FOUR-FIVE TRANSFORAMINAL LUMBAR INTERBODY FUSION WITH DEPUY MPACT SCREWS, RODS, CAGE, LOCAL BONE GRAFT, VIVIGEN (N/A)  Advance diet Up with therapy  Discontinue foley Careful use of narcotics. Recommend discontinuing the gabapentin and tramadol due to concurrent use of cymbalta. This may relate to low BP. Her  serum cortisol level that relates to adrenal pituitary function is normal. Continue with IV fluids. Increase oxycontin to 20 mg q12 hours.   [name] 12/11/2018, 1:41 PMPatient ID: Sarah Duncan, female   DOB: Aug 09, 1965, 53 y.o.   MRN: [id number]

## 2018-12-11 NOTE — Evaluation (Signed)
Physical Therapy Evaluation Patient Details Name: Sarah Duncan MRN: [id number] DOB: [birth year] Today's Date: 12/11/2018   History of Present Illness  Pt is a 53 y.o. female s/p TLIF.  Clinical Impression  Patient is s/p above surgery resulting in the deficits listed below (see PT Problem List). On eval, pt required min assist bed mobility and min assist transfers with RW. Pt educated on 3/3 back precautions. Mobility limited by pain and hypotension. Patient will benefit from skilled PT to increase their independence and safety with mobility (while adhering to their precautions) to allow discharge to the venue listed below.     Follow Up Recommendations Home health PT;Supervision for mobility/OOB    Equipment Recommendations  Rolling walker with 5" wheels;3in1 (PT)    Recommendations for Other Services       Precautions / Restrictions Precautions Precautions: Back;Other (comment) Precaution Booklet Issued: Yes (comment) Precaution Comments: Educated pt on 3/3 back precautions. Handout provided. Check BP. Pt has been hypotensive. Required Braces or Orthoses: Spinal Brace Spinal Brace: Lumbar corset;Applied in sitting position      Mobility  Bed Mobility Overal bed mobility: Needs Assistance Bed Mobility: Rolling;Sidelying to Sit Rolling: Min assist Sidelying to sit: Min assist       General bed mobility comments: +rail, cues for logroll, increased time due to pain  Transfers Overall transfer level: Needs assistance Equipment used: Rolling walker (2 wheeled) Transfers: Sit to/from [facility] Sit to Stand: Min assist;From elevated surface Stand pivot transfers: Min assist       General transfer comment: cues for hand placement, assist to power up and stabilize balance, pivot steps with RW bed to recliner  Ambulation/Gait             General Gait Details: unable to progress ambulation due to pain and hypotension.  Stairs             Wheelchair Mobility    Modified Rankin (Stroke Patients Only)       Balance Overall balance assessment: Needs assistance Sitting-balance support: Bilateral upper extremity supported;Feet supported Sitting balance-Leahy Scale: Fair Sitting balance - Comments: BUE due to pain   Standing balance support: Bilateral upper extremity supported;During functional activity Standing balance-Leahy Scale: Poor Standing balance comment: reliant on RW                             Pertinent Vitals/Pain Pain Assessment: 0-10 Pain Score: 10-Worst pain ever Pain Location: sx site Pain Descriptors / Indicators: Grimacing;Guarding;Moaning;Tightness Pain Intervention(s): Monitored during session;Limited activity within patient's tolerance;Repositioned    Home Living Family/patient expects to be discharged to:: Private residence Living Arrangements: Spouse/significant other Available Help at Discharge: Family;Available 24 hours/day Type of Home: House Home Access: Stairs to enter   [employer] of Steps: 1 (threshold) Home Layout: One level Home Equipment: Toilet riser      Prior Function Level of Independence: Independent               Hand Dominance        Extremity/Trunk Assessment                Communication   Communication: No difficulties  Cognition Arousal/Alertness: Awake/alert Behavior During Therapy: WFL for tasks assessed/performed Overall Cognitive Status: Within Functional Limits for tasks assessed  General Comments General comments (skin integrity, edema, etc.): BP 83/54 in recliner. RN notified.    Exercises     Assessment/Plan    PT Assessment Patient needs continued PT services  PT Problem List Decreased mobility;Decreased activity tolerance;Decreased balance;Pain;Decreased knowledge of use of DME;Decreased knowledge of precautions       PT Treatment  Interventions DME instruction;Therapeutic activities;Gait training;Therapeutic exercise;Patient/family education;Stair training;Balance training;Functional mobility training    PT Goals (Current goals can be found in the Care Plan section)  Acute Rehab PT Goals Patient Stated Goal: home PT Goal Formulation: With patient Time For Goal Achievement: 12/18/18 Potential to Achieve Goals: Good    Frequency Min 5X/week   Barriers to discharge        Co-evaluation               AM-PAC PT "6 Clicks" Mobility  Outcome Measure Help needed turning from your back to your side while in a flat bed without using bedrails?: A Little Help needed moving from lying on your back to sitting on the side of a flat bed without using bedrails?: A Little Help needed moving to and from a bed to a chair (including a wheelchair)?: A Little Help needed standing up from a chair using your arms (e.g., wheelchair or bedside chair)?: A Little Help needed to walk in hospital room?: A Little Help needed climbing 3-5 steps with a railing? : A Lot 6 Click Score: 17    End of Session Equipment Utilized During Treatment: Gait belt;Back brace Activity Tolerance: Treatment limited secondary to medical complications (Comment);Patient limited by pain(hypotension) Patient left: in chair;with call bell/phone within reach Nurse Communication: Mobility status;Other (comment)(BP) PT Visit Diagnosis: Other abnormalities of gait and mobility (R26.89);Pain    Time: 0834-0900 PT Time Calculation (min) (ACUTE ONLY): 26 min   Charges:   PT Evaluation $PT Eval Moderate Complexity: 1 Mod PT Treatments $Therapeutic Activity: 8-22 mins        Lorrin Goodell, PT  Office # [phone] Pager [phone]   Lorriane Shire 12/11/2018, 9:19 AM

## 2018-12-11 NOTE — Progress Notes (Signed)
Physical Therapy Treatment Patient Details Name: Sarah Duncan MRN: [id number] DOB: May 20, 1966 Today's Date: 12/11/2018    History of Present Illness Pt is a 53 y.o. female s/p TLIF.    PT Comments    BP remained low (80/51) after sitting in recliner x 30 minutes. Pt, therefore, assisted back to bed. She required min assist transfer with RW. BP in supine 88/58. RN aware. Unable to progress gait this session due to hypotension/pain.   Follow Up Recommendations  Home health PT;Supervision for mobility/OOB     Equipment Recommendations  Rolling walker with 5" wheels;3in1 (PT)    Recommendations for Other Services       Precautions / Restrictions Precautions Precautions: Back;Other (comment) Precaution Booklet Issued: Yes (comment) Precaution Comments: reviewed back precautions. Check BP due to post-surgical hypotension. Required Braces or Orthoses: Spinal Brace Spinal Brace: Lumbar corset;Applied in sitting position    Mobility  Bed Mobility Overal bed mobility: Needs Assistance Bed Mobility: Sit to Sidelying;Rolling Rolling: Min assist Sidelying to sit: Min assist     Sit to sidelying: Min assist General bed mobility comments: assist with BLE into bed, cues for sequencing  Transfers Overall transfer level: Needs assistance Equipment used: Rolling walker (2 wheeled) Transfers: Sit to/from [facility] Sit to Stand: Min assist Stand pivot transfers: Min assist       General transfer comment: cues for hand placement, assist to power up and stabilize balance. Pivot transfer with RW recliner to bed.  Ambulation/Gait             General Gait Details: unable to progress ambulation due to pain and hypotension.   Stairs             Wheelchair Mobility    Modified Rankin (Stroke Patients Only)       Balance Overall balance assessment: Needs assistance Sitting-balance support: Bilateral upper extremity supported;Feet  supported Sitting balance-Leahy Scale: Fair Sitting balance - Comments: BUE due to pain   Standing balance support: Bilateral upper extremity supported;During functional activity Standing balance-Leahy Scale: Poor Standing balance comment: reliant on RW                            Cognition Arousal/Alertness: Awake/alert Behavior During Therapy: WFL for tasks assessed/performed Overall Cognitive Status: Within Functional Limits for tasks assessed                                        Exercises      General Comments General comments (skin integrity, edema, etc.): BP 80/51 after sitting in recliner 30 minutes. Pt, therefore, assisted back to bed. BP 88/58 upon return to supine.      Pertinent Vitals/Pain Pain Assessment: 0-10 Pain Score: 10-Worst pain ever Pain Location: sx site Pain Descriptors / Indicators: Grimacing;Guarding;Moaning;Tightness Pain Intervention(s): Repositioned;Limited activity within patient's tolerance    Home Living Family/patient expects to be discharged to:: Private residence Living Arrangements: Spouse/significant other Available Help at Discharge: Family;Available 24 hours/day Type of Home: House Home Access: Stairs to enter   Home Layout: One level Home Equipment: Toilet riser      Prior Function Level of Independence: Independent          PT Goals (current goals can now be found in the care plan section) Acute Rehab PT Goals Patient Stated Goal: home PT Goal Formulation: With patient Time For Goal Achievement:  12/18/18 Potential to Achieve Goals: Good Progress towards PT goals: Progressing toward goals    Frequency    Min 5X/week      PT Plan Current plan remains appropriate    Co-evaluation              AM-PAC PT "6 Clicks" Mobility   Outcome Measure  Help needed turning from your back to your side while in a flat bed without using bedrails?: A Little Help needed moving from lying on your  back to sitting on the side of a flat bed without using bedrails?: A Little Help needed moving to and from a bed to a chair (including a wheelchair)?: A Little Help needed standing up from a chair using your arms (e.g., wheelchair or bedside chair)?: A Little Help needed to walk in hospital room?: A Little Help needed climbing 3-5 steps with a railing? : A Lot 6 Click Score: 17    End of Session Equipment Utilized During Treatment: Gait belt;Back brace Activity Tolerance: Treatment limited secondary to medical complications (Comment);Patient limited by pain(post-surgical hypotension) Patient left: in bed;with call bell/phone within reach Nurse Communication: Mobility status;Other (comment)(BP) PT Visit Diagnosis: Other abnormalities of gait and mobility (R26.89);Pain     Time: 8250-5397 PT Time Calculation (min) (ACUTE ONLY): 11 min  Charges:  $Therapeutic Activity: 8-22 mins                     Lorrin Goodell, PT  Office # [phone] Pager [phone]    Lorriane Shire 12/11/2018, 10:12 AM

## 2018-12-12 LAB — CBC WITH DIFFERENTIAL/PLATELET
Abs Immature Granulocytes: 0.06 10*3/uL (ref 0.00–0.07)
Basophils Absolute: 0 10*3/uL (ref 0.0–0.1)
Basophils Relative: 0 %
Eosinophils Absolute: 0.1 10*3/uL (ref 0.0–0.5)
Eosinophils Relative: 1 %
HCT: 31.1 % — ABNORMAL LOW (ref 36.0–46.0)
Hemoglobin: 9.8 g/dL — ABNORMAL LOW (ref 12.0–15.0)
Immature Granulocytes: 0 %
Lymphocytes Relative: 29 %
Lymphs Abs: 4.3 10*3/uL — ABNORMAL HIGH (ref 0.7–4.0)
MCH: 28.3 pg (ref 26.0–34.0)
MCHC: 31.5 g/dL (ref 30.0–36.0)
MCV: 89.9 fL (ref 80.0–100.0)
Monocytes Absolute: 1.4 10*3/uL — ABNORMAL HIGH (ref 0.1–1.0)
Monocytes Relative: 10 %
Neutro Abs: 8.8 10*3/uL — ABNORMAL HIGH (ref 1.7–7.7)
Neutrophils Relative %: 60 %
Platelets: 188 10*3/uL (ref 150–400)
RBC: 3.46 MIL/uL — ABNORMAL LOW (ref 3.87–5.11)
RDW: 14 % (ref 11.5–15.5)
WBC: 14.7 10*3/uL — ABNORMAL HIGH (ref 4.0–10.5)
nRBC: 0 % (ref 0.0–0.2)

## 2018-12-12 LAB — BASIC METABOLIC PANEL
Anion gap: 8 (ref 5–15)
BUN: 6 mg/dL (ref 6–20)
CO2: 24 mmol/L (ref 22–32)
Calcium: 8.2 mg/dL — ABNORMAL LOW (ref 8.9–10.3)
Chloride: 105 mmol/L (ref 98–111)
Creatinine, Ser: 1.17 mg/dL — ABNORMAL HIGH (ref 0.44–1.00)
GFR calc Af Amer: >60 mL/min (ref >60)
GFR calc non Af Amer: 54 mL/min — ABNORMAL LOW (ref >60)
Glucose, Bld: 96 mg/dL (ref 70–99)
Potassium: 3.7 mmol/L (ref 3.5–5.1)
Sodium: 137 mmol/L (ref 135–145)

## 2018-12-12 NOTE — Progress Notes (Signed)
Physical Therapy Treatment Patient Details Name: Sarah Duncan MRN: [id number] DOB: [birth year] Today's Date: 12/12/2018    History of Present Illness Pt is a 53 y.o. female s/p TLIF.    PT Comments    Patient is making slow but steady progress. She had increased pain with bed mobility today but was able to sit with min a. She increased ambulation distance and appeared to have less pain the more she walked. Therapy willc otninue to progress the patient as tolerated.    Follow Up Recommendations  Home health PT;Supervision for mobility/OOB     Equipment Recommendations  Rolling walker with 5" wheels;3in1 (PT)    Recommendations for Other Services       Precautions / Restrictions Precautions Precautions: Back;Other (comment) Precaution Booklet Issued: Yes (comment) Precaution Comments: reviewed back precautions. Check BP due to post-surgical hypotension. Required Braces or Orthoses: Spinal Brace Spinal Brace: Lumbar corset;Applied in sitting position Restrictions Weight Bearing Restrictions: No    Mobility  Bed Mobility Overal bed mobility: Needs Assistance Bed Mobility: Supine to Sit Rolling: Min assist   Supine to sit: Min assist     General bed mobility comments: min a for proper log roll. Min a to sit up in bed. She reported significant pain but required less assist.   Transfers Overall transfer level: Needs assistance Equipment used: Rolling walker (2 wheeled) Transfers: Sit to/from [facility] Sit to Stand: Min assist         General transfer comment: min a for strength. Minor syncope upon standing   Ambulation/Gait Ambulation/Gait assistance: Min assist Gait Distance (Feet): 10 Feet Assistive device: Rolling walker (2 wheeled) Gait Pattern/deviations: Step-through pattern Gait velocity: decreased    General Gait Details: slow step through gait pattern. Very deliberate steps    Stairs             Wheelchair  Mobility    Modified Rankin (Stroke Patients Only)       Balance Overall balance assessment: Needs assistance Sitting-balance support: Bilateral upper extremity supported;Feet supported Sitting balance-Leahy Scale: Fair Sitting balance - Comments: BUE due to pain   Standing balance support: Bilateral upper extremity supported;During functional activity Standing balance-Leahy Scale: Poor Standing balance comment: reliant on RW                            Cognition Arousal/Alertness: Awake/alert Behavior During Therapy: WFL for tasks assessed/performed Overall Cognitive Status: Within Functional Limits for tasks assessed                                        Exercises      General Comments General comments (skin integrity, edema, etc.): no syncope noted so B/P not taken       Pertinent Vitals/Pain Pain Assessment: 0-10 Pain Score: 8  Pain Location: sx site Pain Descriptors / Indicators: Grimacing;Guarding;Moaning;Tightness Pain Intervention(s): Limited activity within patient's tolerance;Premedicated before session;Monitored during session    Home Living                      Prior Function            PT Goals (current goals can now be found in the care plan section) Acute Rehab PT Goals PT Goal Formulation: With patient Time For Goal Achievement: 12/18/18 Potential to Achieve Goals: Good Progress towards PT goals: Progressing toward  goals    Frequency    Min 5X/week      PT Plan Current plan remains appropriate    Co-evaluation              AM-PAC PT "6 Clicks" Mobility   Outcome Measure  Help needed turning from your back to your side while in a flat bed without using bedrails?: A Little Help needed moving from lying on your back to sitting on the side of a flat bed without using bedrails?: A Little Help needed moving to and from a bed to a chair (including a wheelchair)?: A Little Help needed standing up  from a chair using your arms (e.g., wheelchair or bedside chair)?: A Little Help needed to walk in hospital room?: A Little Help needed climbing 3-5 steps with a railing? : A Lot 6 Click Score: 17    End of Session Equipment Utilized During Treatment: Gait belt;Back brace Activity Tolerance: Treatment limited secondary to medical complications (Comment);Patient limited by pain Patient left: in bed;with call bell/phone within reach Nurse Communication: Mobility status;Other (comment) PT Visit Diagnosis: Other abnormalities of gait and mobility (R26.89);Pain     Time: 1130-1148 PT Time Calculation (min) (ACUTE ONLY): 18 min  Charges:  $Gait Training: 8-22 mins                        Carney Living PT DPT  12/12/2018, 2:37 PM

## 2018-12-12 NOTE — Progress Notes (Addendum)
The patient was assisted up to the [facility] and tolerated activity well.  Urinating without any problem.  The dressing was reinforced with tape due to the patient moving around in the bed and the dressing appeared to be folding.  Scant amount of "old" drainage noted.

## 2018-12-12 NOTE — Progress Notes (Signed)
Patient was medicated for pain as ordered and was administered muscle spasm medication also.  Will check frequently to help control post op pain.

## 2018-12-12 NOTE — Progress Notes (Signed)
     Subjective: 2 Days Post-Op Procedure(s) (LRB): RIGHT LUMBAR THREE-FOUR, LUMBAR FOUR-FIVE TRANSFORAMINAL LUMBAR INTERBODY FUSION WITH DEPUY MPACT SCREWS, RODS, CAGE, LOCAL BONE GRAFT, VIVIGEN (N/A) Awake, alert and oriented x 4. She is sitting up in bedside recliner. BP is much improved. IV to [facility]well. Tolerating pos. Only few steps in room so far. PT and OT assisting.  Patient reports pain as moderate.    Objective:   VITALS:  Temp:  [98.7 F (37.1 C)-100.2 F (37.9 C)] 100.2 F (37.9 C) (07/23 0319) Pulse Rate:  [84-105] 105 (07/23 0319) Resp:  [16-17] 16 (07/23 0319) BP: (86-158)/(47-115) 158/115 (07/23 0319) SpO2:  [98 %-100 %] 98 % (07/23 0319)  Neurologically intact ABD soft Neurovascular intact Sensation intact distally Intact pulses distally Dorsiflexion/Plantar flexion intact Incision: dressing C/D/I   LABS Recent Labs    12/10/18 1206 12/10/18 1222 12/10/18 1225 12/11/18 0212 12/12/18 0400  HGB 10.9* 11.2* 10.7* 9.9* 9.8*  WBC  --   --  9.3 11.0* 14.7*  PLT  --   --  247 208 188   Recent Labs    12/11/18 0212 12/12/18 0400  NA 140 137  K 3.8 3.7  CL 110 105  CO2 24 24  BUN 5* 6  CREATININE 1.07* 1.17*  GLUCOSE 150* 96   Recent Labs    12/09/18 1515  INR 1.2     Assessment/Plan: 2 Days Post-Op Procedure(s) (LRB): RIGHT LUMBAR THREE-FOUR, LUMBAR FOUR-FIVE TRANSFORAMINAL LUMBAR INTERBODY FUSION WITH DEPUY MPACT SCREWS, RODS, CAGE, LOCAL BONE GRAFT, VIVIGEN (N/A)  Advance diet Up with therapy D/C IV fluids Plan for discharge tomorrow  D/C Hepwells.  [name] 12/12/2018, 1:51 PMPatient ID: Sarah Duncan, female   DOB: 10/15/1965, 53 y.o.   MRN: [id number]

## 2018-12-12 NOTE — Progress Notes (Signed)
The patient appeared to be having a lot of discomfort from the surgical area (back) when patient rounds were made.  The patient had a understanding that she was supposed to be getting pain medication every 3 hours.  The patient was educated regarding PRN medication and explained that she will need to request when needed.  The patient verbalizes understanding this. Encouraged deep breathing and other alternatives methods to help with pain management.

## 2018-12-13 DIAGNOSIS — M48061 Spinal stenosis, lumbar region without neurogenic claudication: Secondary | ICD-10-CM | POA: Diagnosis not present

## 2018-12-13 DIAGNOSIS — M5030 Other cervical disc degeneration, unspecified cervical region: Secondary | ICD-10-CM | POA: Diagnosis not present

## 2018-12-13 DIAGNOSIS — I10 Essential (primary) hypertension: Secondary | ICD-10-CM | POA: Diagnosis not present

## 2018-12-13 DIAGNOSIS — Z4789 Encounter for other orthopedic aftercare: Secondary | ICD-10-CM | POA: Diagnosis not present

## 2018-12-13 DIAGNOSIS — G8929 Other chronic pain: Secondary | ICD-10-CM | POA: Diagnosis not present

## 2018-12-13 DIAGNOSIS — M5136 Other intervertebral disc degeneration, lumbar region: Secondary | ICD-10-CM | POA: Diagnosis not present

## 2018-12-13 MED ORDER — OXYCODONE HCL ER 15 MG PO T12A: 15.0000 mg | EXTENDED_RELEASE_TABLET | Freq: Two times a day (BID) | ORAL | 0 refills | Status: DC

## 2018-12-13 MED ORDER — POLYETHYLENE GLYCOL 3350 17 G PO PACK: 17.0000 g | PACK | Freq: Every day | ORAL | 0 refills | Status: AC | PRN

## 2018-12-13 MED ORDER — OXYCODONE HCL 10 MG PO TABS: 10.0000 mg | ORAL_TABLET | ORAL | 0 refills | Status: DC | PRN

## 2018-12-13 NOTE — Discharge Instructions (Signed)

## 2018-12-13 NOTE — Progress Notes (Signed)
Occupational Therapy Treatment Patient Details Name: Sarah Duncan MRN: [id number] DOB: [birth year] Today's Date: 12/13/2018    History of present illness Pt is a 53 y.o. female s/p TLIF.   OT comments  Pt progressing toward OT goals with less complaints of pain this session. Pt educated on tasks modification with grooming at the sink to adhere to back precautions. Pt required supervision to min guard for bed mob, min guard to Min A functional transfer for safety, and Independent on don/doff of back brace. DC and freq remains the same. OT will continue to follow acutely.    Follow Up Recommendations  Home health OT;Supervision/Assistance - 24 hour    Equipment Recommendations  3 in 1 bedside commode    Recommendations for Other Services      Precautions / Restrictions Precautions Precautions: Back;Other (comment) Precaution Booklet Issued: Yes (comment) Precaution Comments: reviewed back precautions. Check BP due to post-surgical hypotension. Required Braces or Orthoses: Spinal Brace Spinal Brace: Lumbar corset;Applied in sitting position Restrictions Weight Bearing Restrictions: No       Mobility Bed Mobility Overal bed mobility: Needs Assistance Bed Mobility: Supine to Sit Rolling: Supervision Sidelying to sit: Min guard     Sit to sidelying: Min guard General bed mobility comments: Less physical assistance required with log roll. Pt able to don/doff brace independently.   Transfers Overall transfer level: Needs assistance Equipment used: Rolling walker (2 wheeled) Transfers: Sit to/from [facility] Sit to Stand: Min guard         General transfer comment: Min guard for safety. Pt did report some dizziness when ambulating from bathroom to bed. No signs of syncope.     Balance Overall balance assessment: Needs assistance Sitting-balance support: Bilateral upper extremity supported;Feet supported Sitting balance-Leahy Scale:  Fair Sitting balance - Comments: BUE due to pain   Standing balance support: Bilateral upper extremity supported;During functional activity Standing balance-Leahy Scale: Poor Standing balance comment: reliant on RW                           ADL either performed or assessed with clinical judgement   ADL Overall ADL's : Needs assistance/impaired     Grooming: Set up;Sitting;Oral care;Wash/dry face Grooming Details (indicate cue type and reason): engaged in grooming task at the sink with BSC. Educated on task modification to adhere to back precautions.                 Toilet Transfer: Minimal assistance;Cueing for safety;Cueing for sequencing;Ambulation;RW Toilet Transfer Details (indicate cue type and reason): BSC transfer from bed to [facility] in restroom. MinA for safety, and VCs for hand placement and sequencing.          Functional mobility during ADLs: Minimal assistance;Rolling walker General ADL Comments: Session improved with engagement in sink level activity while seated on BSC.      Vision       Perception     Praxis      Cognition Arousal/Alertness: Awake/alert Behavior During Therapy: WFL for tasks assessed/performed Overall Cognitive Status: Within Functional Limits for tasks assessed                                          Exercises     Shoulder Instructions       General Comments      Pertinent Vitals/ Pain  Pain Assessment: 0-10 Pain Score: 8  Pain Location: sx site Pain Descriptors / Indicators: Grimacing;Guarding;Moaning;Tightness Pain Intervention(s): Monitored during session;Premedicated before session;Repositioned  Home Living                                          Prior Functioning/Environment              Frequency  Min 2X/week        Progress Toward Goals  OT Goals(current goals can now be found in the care plan section)  Progress towards OT goals: Progressing toward  goals  Acute Rehab OT Goals Patient Stated Goal: home OT Goal Formulation: With patient Time For Goal Achievement: 12/25/18 Potential to Achieve Goals: Good ADL Goals Pt Will Perform Lower Body Dressing: with modified independence;with adaptive equipment;sitting/lateral leans Pt Will Transfer to Toilet: with set-up;ambulating;bedside commode Pt Will Perform Tub/Shower Transfer: 3 in 1;ambulating;with supervision  Plan Discharge plan remains appropriate;Frequency remains appropriate    Co-evaluation                 AM-PAC OT "6 Clicks" Daily Activity     Outcome Measure   Help from another person eating meals?: A Little Help from another person taking care of personal grooming?: A Little Help from another person toileting, which includes using toliet, bedpan, or urinal?: A Little Help from another person bathing (including washing, rinsing, drying)?: A Lot Help from another person to put on and taking off regular upper body clothing?: A Little Help from another person to put on and taking off regular lower body clothing?: A Lot 6 Click Score: 16    End of Session Equipment Utilized During Treatment: Back brace  OT Visit Diagnosis: Pain;Unsteadiness on feet (R26.81)   Activity Tolerance Patient tolerated treatment well   Patient Left in bed;with call bell/phone within reach;with nursing/sitter in room   Nurse Communication Precautions        Time: 1610-9604 OT Time Calculation (min): 22 min  Charges: OT General Charges $OT Visit: 1 Visit OT Treatments $Self Care/Home Management : 8-22 mins  Minus Breeding, MSOT, OTR/L  Supplemental Rehabilitation Services  [phone]    Marius Ditch 12/13/2018, 10:21 AM

## 2018-12-13 NOTE — Progress Notes (Signed)
     Subjective: 3 Days Post-Op Procedure(s) (LRB): RIGHT LUMBAR THREE-FOUR, LUMBAR FOUR-FIVE TRANSFORAMINAL LUMBAR INTERBODY FUSION WITH DEPUY MPACT SCREWS, RODS, CAGE, LOCAL BONE GRAFT, VIVIGEN (N/A) Awake, alert and oriented x 4. I hurt. Only walked in room, BP is normal. Patient reports pain as moderate.    Objective:   VITALS:  Temp:  [97.7 F (36.5 C)-98.8 F (37.1 C)] 98.7 F (37.1 C) (07/24 0817) Pulse Rate:  [86-98] 97 (07/24 0817) Resp:  [16-18] 16 (07/24 0817) BP: (87-117)/(57-72) 107/62 (07/24 0817) SpO2:  [95 %-100 %] 100 % (07/24 0817)  Neurologically intact ABD soft Neurovascular intact Sensation intact distally Intact pulses distally Dorsiflexion/Plantar flexion intact Incision: scant drainage Dressing changed.   LABS Recent Labs    12/10/18 1206 12/10/18 1222  12/10/18 1225 12/11/18 0212 12/12/18 0400  HGB 10.9* 11.2*  --  10.7* 9.9* 9.8*  WBC  --   --    < > 9.3 11.0* 14.7*  PLT  --   --    < > 247 208 188   < > = values in this interval not displayed.   Recent Labs    12/11/18 0212 12/12/18 0400  NA 140 137  K 3.8 3.7  CL 110 105  CO2 24 24  BUN 5* 6  CREATININE 1.07* 1.17*  GLUCOSE 150* 96   No results for input(s): LABPT, INR in the last 72 hours.   Assessment/Plan: 3 Days Post-Op Procedure(s) (LRB): RIGHT LUMBAR THREE-FOUR, LUMBAR FOUR-FIVE TRANSFORAMINAL LUMBAR INTERBODY FUSION WITH DEPUY MPACT SCREWS, RODS, CAGE, LOCAL BONE GRAFT, VIVIGEN (N/A)  Advance diet Up with therapy Discharge home with home health  [name] 12/13/2018, 10:55 AMPatient ID: Sarah Duncan, female   DOB: May 22, 1966, 53 y.o.   MRN: [id number]

## 2018-12-13 NOTE — Care Management Important Message (Signed)
Important Message  Patient Details  Name: Sarah Duncan MRN: [id number] Date of Birth: 05/27/1965   Medicare Important Message Given:  Yes     Memory Argue 12/13/2018, 1:47 PM

## 2018-12-13 NOTE — Progress Notes (Signed)
Discharge instructions provided to the patient and via face time to her wife.  Both verbalize understanding.  The patient reported that Dr Louanne Skye reviewed instructions with them also.  A discharge packet was provided.  The patient was sent home via wheelchair to home.

## 2018-12-16 ENCOUNTER — Telehealth: Payer: Self-pay | Admitting: Specialist

## 2018-12-16 NOTE — Telephone Encounter (Signed)
Please advise 

## 2018-12-16 NOTE — Telephone Encounter (Signed)
Patient's wife Denyse Dago called advised the Oxycodone is not working and asked if patient can get something stronger for the pain. The number to contact Denyse Dago is [phone]

## 2018-12-17 ENCOUNTER — Telehealth: Payer: Self-pay | Admitting: Specialist

## 2018-12-17 NOTE — Telephone Encounter (Signed)
pls advise. Thanks.  

## 2018-12-17 NOTE — Telephone Encounter (Signed)
Patient called stated the Oxycodone ER was on back order per [facility].  She is asking for something else to be called in. She is in severe pain.  Please call patient to advise. [phone]

## 2018-12-18 DIAGNOSIS — I10 Essential (primary) hypertension: Secondary | ICD-10-CM | POA: Diagnosis not present

## 2018-12-18 DIAGNOSIS — M5136 Other intervertebral disc degeneration, lumbar region: Secondary | ICD-10-CM | POA: Diagnosis not present

## 2018-12-18 DIAGNOSIS — M48061 Spinal stenosis, lumbar region without neurogenic claudication: Secondary | ICD-10-CM | POA: Diagnosis not present

## 2018-12-18 DIAGNOSIS — M5030 Other cervical disc degeneration, unspecified cervical region: Secondary | ICD-10-CM | POA: Diagnosis not present

## 2018-12-18 DIAGNOSIS — Z4789 Encounter for other orthopedic aftercare: Secondary | ICD-10-CM | POA: Diagnosis not present

## 2018-12-18 DIAGNOSIS — G8929 Other chronic pain: Secondary | ICD-10-CM | POA: Diagnosis not present

## 2018-12-19 ENCOUNTER — Emergency Department (HOSPITAL_COMMUNITY)
Admission: EM | Admit: 2018-12-19 | Discharge: 2018-12-19 | Disposition: A | Discharge status: Home / Self Care | Payer: Medicare Other | Attending: Emergency Medicine | Admitting: Emergency Medicine

## 2018-12-19 ENCOUNTER — Other Ambulatory Visit: Payer: Self-pay | Admitting: Specialist

## 2018-12-19 ENCOUNTER — Encounter (HOSPITAL_COMMUNITY): Payer: Self-pay | Admitting: *Deleted

## 2018-12-19 ENCOUNTER — Other Ambulatory Visit: Payer: Self-pay

## 2018-12-19 ENCOUNTER — Telehealth: Payer: Self-pay | Admitting: *Deleted

## 2018-12-19 DIAGNOSIS — G8918 Other acute postprocedural pain: Secondary | ICD-10-CM

## 2018-12-19 DIAGNOSIS — I10 Essential (primary) hypertension: Secondary | ICD-10-CM | POA: Diagnosis not present

## 2018-12-19 DIAGNOSIS — Z79899 Other long term (current) drug therapy: Secondary | ICD-10-CM | POA: Diagnosis not present

## 2018-12-19 DIAGNOSIS — Z87891 Personal history of nicotine dependence: Secondary | ICD-10-CM | POA: Diagnosis not present

## 2018-12-19 DIAGNOSIS — M545 Low back pain: Secondary | ICD-10-CM | POA: Diagnosis not present

## 2018-12-19 MED ORDER — HYDROMORPHONE HCL 1 MG/ML IJ SOLN
0.5000 mg | Freq: Once | INTRAMUSCULAR | Status: AC
  Administered 2018-12-19: 0.5 mg via INTRAVENOUS
  Filled 2018-12-19: qty 1

## 2018-12-19 MED ORDER — HYDROMORPHONE HCL 1 MG/ML IJ SOLN
0.5000 mg | Freq: Once | INTRAMUSCULAR | Status: AC
  Administered 2018-12-19: 13:00:00 0.5 mg via INTRAVENOUS
  Filled 2018-12-19: qty 1

## 2018-12-19 MED ORDER — MORPHINE SULFATE ER 15 MG PO TBCR: 15.0000 mg | EXTENDED_RELEASE_TABLET | Freq: Two times a day (BID) | ORAL | 0 refills | Status: DC

## 2018-12-19 NOTE — ED Provider Notes (Signed)
[facility] EMERGENCY DEPARTMENT Provider Note   CSN: [id number] Arrival date & time: 12/19/18  1008    History   Chief Complaint Chief Complaint  Patient presents with   Post-op Problem    HPI Sarah Duncan is a 53 y.o. female.     HPI   53 year old female presents today with complaints of back pain.  Patient notes she underwent lumbar fusion last Tuesday.  She notes she was discharged from the hospital on Friday.  She notes at the time she was discharged she was prescribed fast acting oxycodone, and long-acting oxycodone.  She notes that the pharmacy could not fill the long-acting oxycodone.  They attempted calling numerous pharmacies of different companies within the [place] area, none of which have the medication.  She notes that for the past 2 days she has been attempting to contact her orthopedic surgeon who performed the operation.  She has been unable to establish contact with him.  She has been using her prescribed medications but is having severe pain.  She notes the pain is in the middle of her back, she notes some minor weakness and numbness in her right lower extremity, she notes this is chronic and has been unchanged since the surgery.  She denies any abdominal pain or fever.    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    Patient Active Problem List   Diagnosis Date Noted   S/P lumbar spinal fusion 12/10/2018   Degenerative disc disease, lumbar 12/10/2018    Class: Chronic   Spinal stenosis of lumbar region 12/10/2018    Class: Chronic   Surgery, elective    Generalized anxiety disorder 09/07/2017   Essential  hypertension 07/31/2017   Other hyperlipidemia 03/06/2017   Class 2 obesity with serious comorbidity and body mass index (BMI) of 35.0 to 35.9 in adult 01/08/2017   Other chronic pain 12/04/2016   Prediabetes 10/18/2016   Vitamin D deficiency 10/18/2016   Depression 11/23/2015   Single kidney 10/12/2015   Chronic lower back pain 06/18/2015    Past Surgical History:  Procedure Laterality Date   ABDOMINAL HYSTERECTOMY  2007   CESAREAN SECTION  11/19/1989   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 Christia Reading   TOOTH EXTRACTION  04/16/2017   TUBAL LIGATION  05/09/1993     OB History    Gravida  4   Para  4   Term  4   Preterm      AB      Living  4     SAB      TAB      Ectopic      Multiple      Live Births               Home Medications    Prior to Admission medications   Medication Sig Start Date End Date Taking? Authorizing Provider  busPIRone (BUSPAR) 15 MG tablet Take 15 mg by mouth 2 (two) times daily.  12/07/17   [provider]  DULoxetine (CYMBALTA) 60 MG capsule Take 1 capsule (60 mg total) by mouth daily. Patient taking differently: Take 60 mg by mouth at bedtime.  09/07/17   Harrison Mons, PA  Melatonin 10 MG TABS Take 20 mg by mouth at bedtime as needed (sleep).     [provider]  morphine (MS CONTIN) 15 MG 12 hr tablet Take 1 tablet (15 mg total) by mouth every 12 (twelve) hours. 12/19/18   Jessy Oto, MD  Multiple Vitamin (MULTIVITAMIN WITH MINERALS) TABS tablet Take 2 tablets by mouth daily.     [provider]  oxyCODONE 10 MG TABS Take 1 tablet (10 mg total) by mouth every 4 (four) hours as needed for severe pain ((score 7 to 10)). 12/13/18   Jessy Oto, MD  polyethylene glycol (MIRALAX / GLYCOLAX) 17 g packet Take 17 g by mouth daily as needed for mild constipation. 12/13/18   Jessy Oto, MD  promethazine (PHENERGAN) 12.5 MG tablet  Take 12.5 mg by mouth every 6 (six) hours as needed for nausea or vomiting.    [provider]  tiZANidine (ZANAFLEX) 4 MG tablet Take 0.5-1 tablets (2-4 mg total) by mouth every 6 (six) hours as needed for muscle spasms. Not more than 3 times daily. Patient taking differently: Take 4 mg by mouth 2 (two) times a day.  10/08/17   Harrison Mons, PA    Family History Family History  Problem Relation Age of Onset   Diabetes Mother    Hypertension Mother    Diabetes Brother    Hypertension Brother    Cancer Father    Endometriosis Daughter    Diabetes Daughter    HIV Son     Social History Social History   Tobacco Use   Smoking status: Former Smoker    Years: 1.00    Types: Cigarettes   Smokeless tobacco: Never Used  Substance Use Topics   Alcohol use: Yes    Alcohol/week: 1.0 standard drinks    Types: 1 Standard drinks or equivalent per week    Comment: social   Drug use: No     Allergies   Patient has no known allergies.   Review of Systems Review of Systems  All other systems reviewed and are negative.  Physical Exam Updated Vital Signs BP 126/87 (BP Location: Right Arm)    Pulse 86    Temp 99.4 F (37.4 C) (Oral)    Resp 16    SpO2 99%   Physical Exam Vitals signs and nursing note reviewed.  Constitutional:      Appearance: She is well-developed.  HENT:     Head: Normocephalic and atraumatic.  Eyes:     General: No scleral icterus.       Right eye: No discharge.        Left eye: No discharge.     Conjunctiva/sclera: Conjunctivae normal.     Pupils: Pupils are equal, round, and reactive to light.  Neck:     Musculoskeletal: Normal range of motion.     Vascular: No JVD.     Trachea: No tracheal deviation.  Pulmonary:     Effort: Pulmonary effort is normal.     Breath sounds: No stridor.  Musculoskeletal:     Comments: Midline surgical scar of the lower lumbar region, no surrounding redness discharge wound is clean with no signs of  infection or dehiscence-right leg with decreased sensation throughout, strength intact although reduced compared to the right this is baseline  Neurological:     Mental Status: She is alert and oriented to person, place, and time.     Coordination: Coordination normal.  Psychiatric:        Behavior: Behavior normal.        Thought Content: Thought content normal.        Judgment: Judgment normal.      ED Treatments / Results  Labs (all labs ordered are listed, but only abnormal results are displayed) Labs Reviewed - No data to display  EKG None  Radiology No results found.  Procedures Procedures (including critical care time)  Medications Ordered in ED Medications  HYDROmorphone (DILAUDID) injection 0.5 mg (0.5 mg Intravenous Given 12/19/18 1146)  HYDROmorphone (DILAUDID) injection 0.5 mg (0.5 mg Intravenous Given 12/19/18 1316)     Initial Impression / Assessment and Plan / ED Course  I have reviewed the triage vital signs and the nursing notes.  Pertinent labs & imaging results that were available during my care of the patient were reviewed by me and considered in my medical decision making (see chart for details).       53 year old female presents today with severe low back pain.  Patient has no apparent complications.  I was able to make contact with Dr. Louanne Skye who called in morphine for her.  Patient will take the morphine as needed, follow-up as an outpatient, return immediately if she develops any new or worsening signs or symptoms.  She verbalized understanding and agreement to this plan had no further questions or concerns at the time of discharge.  Final Clinical Impressions(s) / ED Diagnoses   Final diagnoses:  Post-operative pain    ED Discharge Orders    None       Okey Regal, PA-C 12/19/18 1837    Malvin Johns, MD 12/20/18 [phone]

## 2018-12-19 NOTE — ED Triage Notes (Signed)
Pt in c/o mid lower back pain post op from back surgery from [place], [place], pt reports that the pain medicine she was prescribed are not available in [place] d/t back order, pt continues to have pain, pt discharged from hospital last Friday, pt A&O x4, presents with back brace in place, pt denies bowel & urine incontinence

## 2018-12-19 NOTE — Telephone Encounter (Signed)
Dr. Louanne Skye spoke with Merry Proud

## 2018-12-19 NOTE — Telephone Encounter (Signed)
Rx for morphine ER sent to her pharmacy earlier today.

## 2018-12-19 NOTE — Telephone Encounter (Signed)
Sarah Duncan called stating is having difficulty getting her RX for pain and would like to speak to either Dr. Louanne Skye or Benjiman Core in trying to do something for this pt. Sarah Duncan would like for someone to call him today.  CB [phone]

## 2018-12-19 NOTE — Discharge Instructions (Addendum)
Please read attached information. If you experience any new or worsening signs or symptoms please return to the emergency room for evaluation. Please follow-up with your primary care provider or specialist as discussed. Please use medication prescribed only as directed and discontinue taking if you have any concerning signs or symptoms.   °

## 2018-12-19 NOTE — Discharge Summary (Signed)
Patient ID: Sarah Duncan MRN: [id number] DOB/AGE: Apr 17, 1966 53 y.o.  Admit date: 12/10/2018 Discharge date: 12/19/2018  Admission Diagnoses:  Principal Problem:   Degenerative disc disease, lumbar Active Problems:   S/P lumbar spinal fusion   Spinal stenosis of lumbar region   Surgery, elective   Discharge Diagnoses:  Principal Problem:   Degenerative disc disease, lumbar Active Problems:   S/P lumbar spinal fusion   Spinal stenosis of lumbar region   Surgery, elective  status post Procedure(s): RIGHT LUMBAR THREE-FOUR, LUMBAR FOUR-FIVE TRANSFORAMINAL LUMBAR INTERBODY FUSION WITH DEPUY MPACT SCREWS, RODS, CAGE, LOCAL BONE GRAFT, VIVIGEN  Past Medical History:  Diagnosis Date  . Anemia   . Anxiety   . Arthritis   . Constipation   . Degeneration of cervical intervertebral disc   . Depression   . Fibroids   . 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 Lactose intolerance   . Palpitations   . Shortness of breath   . 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 female who was admitted 12/10/2018 for operative treatment of Degenerative disc disease, lumbar. Patient failed conservative treatments (please see the history and physical for the specifics) and had severe unremitting pain that affects sleep, daily activities and work/hobbies. After pre-op clearance, the patient was taken to the operating room on 12/10/2018 and underwent  Procedure(s): RIGHT LUMBAR THREE-FOUR, LUMBAR FOUR-FIVE TRANSFORAMINAL LUMBAR INTERBODY FUSION WITH DEPUY MPACT SCREWS, RODS, CAGE, LOCAL BONE GRAFT, VIVIGEN.    Patient was given perioperative antibiotics:  Anti-infectives  (From admission, onward)   Start     Dose/Rate Route Frequency Ordered Stop   12/10/18 2030  ceFAZolin (ANCEF) IVPB 2g/100 mL premix     2 g 200 mL/hr over 30 Minutes Intravenous Every 8 hours 12/10/18 1657 12/11/18 0411   12/10/18 0615  ceFAZolin (ANCEF) IVPB 2g/100 mL premix     2 g 200 mL/hr over 30 Minutes Intravenous On call to O.R. 12/10/18 0600 12/10/18 1234   12/10/18 0602  ceFAZolin (ANCEF) 2-4 GM/100ML-% IVPB    Note to Pharmacy: Lorne Skeens   : cabinet override      12/10/18 0602 12/10/18 0815       Patient was given sequential compression devices and early ambulation to prevent DVT.   Patient benefited maximally from hospital stay and there were no complications. At the time of discharge, the patient was urinating/moving their bowels without difficulty, tolerating a regular diet, pain is controlled with oral pain medications and they have been cleared by PT/OT.   Recent vital signs: No data found.   Recent laboratory studies: No results for input(s): WBC, HGB, HCT, PLT, NA, K, CL, CO2, BUN, CREATININE, GLUCOSE, INR, CALCIUM in the last 72 hours.  Invalid input(s): PT, 2   Discharge Medications:   Allergies as of 12/13/2018   No Known Allergies     Medication List    STOP taking these medications   acetaminophen 650 MG CR tablet Commonly known as: TYLENOL   HYDROcodone-acetaminophen 5-325 MG tablet Commonly known as: NORCO/VICODIN     TAKE these medications   busPIRone 15 MG tablet Commonly known as: BUSPAR Take 15 mg by mouth 2 (two) times daily.   DULoxetine 60 MG capsule Commonly known  as: Cymbalta Take 1 capsule (60 mg total) by mouth daily. What changed: when to take this   Melatonin 10 MG Tabs Take 20 mg by mouth at bedtime as needed (sleep).   multivitamin with minerals Tabs tablet Take 2 tablets by mouth daily.   Oxycodone HCl 10 MG Tabs Take 1 tablet (10 mg total) by mouth every 4 (four) hours as needed for severe pain ((score 7 to 10)).    oxyCODONE 15 mg 12 hr tablet Commonly known as: OXYCONTIN Take 1 tablet (15 mg total) by mouth every 12 (twelve) hours for 14 days.   polyethylene glycol 17 g packet Commonly known as: MIRALAX / GLYCOLAX Take 17 g by mouth daily as needed for mild constipation.   promethazine 12.5 MG tablet Commonly known as: PHENERGAN Take 12.5 mg by mouth every 6 (six) hours as needed for nausea or vomiting.   tiZANidine 4 MG tablet Commonly known as: ZANAFLEX Take 0.5-1 tablets (2-4 mg total) by mouth every 6 (six) hours as needed for muscle spasms. Not more than 3 times daily. What changed:   how much to take  when to take this  additional instructions       Diagnostic Studies: Dg Chest 2 View  Result Date: 12/09/2018 CLINICAL DATA:  Preop lumbar surgery. EXAM: CHEST - 2 VIEW COMPARISON:  09/14/2018 FINDINGS: Lungs are adequately inflated and otherwise clear. Cardiomediastinal silhouette and remainder of the exam is unchanged. IMPRESSION: No active cardiopulmonary disease. Electronically Signed   By: Marin Olp M.D.   On: 12/09/2018 21:36   Dg Lumbar Spine 2-3 Views  Result Date: 12/10/2018 CLINICAL DATA:  Surgical posterior fusion of lower lumbar spine. EXAM: DG C-ARM 61-120 MIN; LUMBAR SPINE - 2-3 VIEW FLUOROSCOPY TIME:  2 minutes 10 seconds. COMPARISON:  MRI of November 07, 2018. FINDINGS: Five intraoperative fluoroscopic images were obtained of the lumbar spine. These demonstrate the patient be status post surgical posterior fusion of L3-4 and L4-5 with bilateral intrapedicular screw placement and interbody fusion. Good alignment of vertebral bodies is noted. IMPRESSION: Fluoroscopic guidance provided during surgical posterior fusion of L3-4 and L4-5. Electronically Signed   By: Marijo Conception M.D.   On: 12/10/2018 15:51   Dg C-arm 1-60 Min  Result Date: 12/10/2018 CLINICAL DATA:  Surgical posterior fusion of lower lumbar spine. EXAM: DG C-ARM 61-120 MIN; LUMBAR SPINE - 2-3 VIEW  FLUOROSCOPY TIME:  2 minutes 10 seconds. COMPARISON:  MRI of November 07, 2018. FINDINGS: Five intraoperative fluoroscopic images were obtained of the lumbar spine. These demonstrate the patient be status post surgical posterior fusion of L3-4 and L4-5 with bilateral intrapedicular screw placement and interbody fusion. Good alignment of vertebral bodies is noted. IMPRESSION: Fluoroscopic guidance provided during surgical posterior fusion of L3-4 and L4-5. Electronically Signed   By: Marijo Conception M.D.   On: 12/10/2018 15:51    Discharge Instructions    Call MD / Call 911   Complete by: As directed    If you experience chest pain or shortness of breath, CALL 911 and be transported to the hospital emergency room.  If you develope a fever above 101 F, pus (white drainage) or increased drainage or redness at the wound, or calf pain, call your surgeon's office.   Constipation Prevention   Complete by: As directed    Drink plenty of fluids.  Prune juice may be helpful.  You may use a stool softener, such as Colace (over the counter) 100 mg twice a day.  Use MiraLax (over the counter) for constipation as needed.   Diet - low sodium heart healthy   Complete by: As directed    Discharge instructions   Complete by: As directed    Call if there is increasing drainage, fever greater than 101.5, severe head aches, and worsening nausea or light sensitivity. If shortness of breath, bloody cough or chest tightness or pain go to an emergency room. No lifting greater than 10 lbs. Avoid bending, stooping and twisting. Use brace when sitting and out of bed even to go to bathroom. Walk in house for first 2 weeks then may start to get out slowly increasing distances up to one mile by 4-6 weeks post op. After 5 days may shower and change dressing following bathing with shower.When bathing remove the brace shower and replace brace before getting out of the shower. If drainage, keep dry dressing and do not bathe the  incision, use an moisture impervious dressing. Please call and return for scheduled follow up appointment 2 weeks from the time of surgery.   Driving restrictions   Complete by: As directed    No driving for 6 weeks   Increase activity slowly as tolerated   Complete by: As directed    Lifting restrictions   Complete by: As directed    No lifting for 12 weeks      Follow-up Information    Jessy Oto, MD In 2 weeks.   Specialty: Orthopedic Surgery Why: For wound re-check Contact information: [place] [address] [address], [facility] Follow up.   Specialty: Home Health Services Contact information: [place] [address] [phone]           Discharge Plan:  discharge to home  Disposition:     Signed: Benjiman Core  12/19/2018, 11:13 AM

## 2018-12-20 ENCOUNTER — Encounter (HOSPITAL_COMMUNITY): Payer: Self-pay | Admitting: Specialist

## 2018-12-20 NOTE — Telephone Encounter (Signed)
I called patient. She picked up medication yesterday.

## 2018-12-23 DIAGNOSIS — I10 Essential (primary) hypertension: Secondary | ICD-10-CM | POA: Diagnosis not present

## 2018-12-23 DIAGNOSIS — M5136 Other intervertebral disc degeneration, lumbar region: Secondary | ICD-10-CM | POA: Diagnosis not present

## 2018-12-23 DIAGNOSIS — M5030 Other cervical disc degeneration, unspecified cervical region: Secondary | ICD-10-CM | POA: Diagnosis not present

## 2018-12-23 DIAGNOSIS — Z4789 Encounter for other orthopedic aftercare: Secondary | ICD-10-CM | POA: Diagnosis not present

## 2018-12-23 DIAGNOSIS — M48061 Spinal stenosis, lumbar region without neurogenic claudication: Secondary | ICD-10-CM | POA: Diagnosis not present

## 2018-12-23 DIAGNOSIS — G8929 Other chronic pain: Secondary | ICD-10-CM | POA: Diagnosis not present

## 2018-12-24 ENCOUNTER — Telehealth: Payer: Self-pay | Admitting: Cardiology

## 2018-12-24 NOTE — Telephone Encounter (Signed)
lm about recall

## 2018-12-26 ENCOUNTER — Ambulatory Visit (INDEPENDENT_AMBULATORY_CARE_PROVIDER_SITE_OTHER): Payer: Medicare Other

## 2018-12-26 ENCOUNTER — Ambulatory Visit (INDEPENDENT_AMBULATORY_CARE_PROVIDER_SITE_OTHER): Payer: Medicare Other | Admitting: Specialist

## 2018-12-26 ENCOUNTER — Encounter: Payer: Self-pay | Admitting: Specialist

## 2018-12-26 VITALS — BP 123/82 | HR 79 | Ht 67.0 in | Wt 210.0 lb

## 2018-12-26 DIAGNOSIS — M5136 Other intervertebral disc degeneration, lumbar region: Secondary | ICD-10-CM | POA: Diagnosis not present

## 2018-12-26 DIAGNOSIS — M5030 Other cervical disc degeneration, unspecified cervical region: Secondary | ICD-10-CM | POA: Diagnosis not present

## 2018-12-26 DIAGNOSIS — I10 Essential (primary) hypertension: Secondary | ICD-10-CM | POA: Diagnosis not present

## 2018-12-26 DIAGNOSIS — Z981 Arthrodesis status: Secondary | ICD-10-CM

## 2018-12-26 DIAGNOSIS — M48061 Spinal stenosis, lumbar region without neurogenic claudication: Secondary | ICD-10-CM | POA: Diagnosis not present

## 2018-12-26 DIAGNOSIS — Z4789 Encounter for other orthopedic aftercare: Secondary | ICD-10-CM | POA: Diagnosis not present

## 2018-12-26 DIAGNOSIS — G8929 Other chronic pain: Secondary | ICD-10-CM | POA: Diagnosis not present

## 2018-12-26 MED ORDER — OXYCODONE HCL 10 MG PO TABS: 10.0000 mg | ORAL_TABLET | ORAL | 0 refills | Status: DC | PRN

## 2018-12-26 NOTE — Patient Instructions (Addendum)
Plan: Avoid frequent bending and stooping  No lifting greater than 10 lbs. May use ice or moist heat for pain. Weight loss is of benefit. Medications for pain as needed, trying to use the least amount and gradually wean from their use. Ice to the lumbar area 3-4 times per day. Exercise is important to improve your indurance and does allow people to function better inspite of back pain. Walking is your best exercise, may start to walk outside of your home, go to store to walk.

## 2018-12-26 NOTE — Progress Notes (Signed)
Post-Op Visit Note   [name]           Date of Birth: [birth year]           MRN: [id number] Visit Date: 12/26/2018 PCP: Bartholome Bill, MD   Assessment & Plan:2 weeks post lumbar fusion L4-5 with TLIF, Fall one week ago.  Chief Complaint:  Chief Complaint  Patient presents with  . Lower Back - Routine Post Op   Visit Diagnoses:  1. S/P lumbar fusion   Fell on Thursday one week ago getting up and trying to reach for her walker she missed and fell against her Bed. She has PT at home, may need for up to 8 weeks. Normal BM and bladder function.No leg pain numbness or tingling.  Plan: Avoid frequent bending and stooping  No lifting greater than 10 lbs. May use ice or moist heat for pain. Weight loss is of benefit. Medications for pain as needed, trying to use the least amount and gradually wean from their use. Ice to the lumbar area 3-4 times per day. Exercise is important to improve your indurance and does allow people to function better inspite of back pain. Walking is your best exercise, may start to walk outside of your home, go to store to walk.  Follow-Up Instructions: No follow-ups on file.   Orders:  Orders Placed This Encounter  Procedures  . XR Lumbar Spine 2-3 Views   No orders of the defined types were placed in this encounter.   Imaging: No results found.  PMFS History: Patient Active Problem List   Diagnosis Date Noted  . Degenerative disc disease, lumbar 12/10/2018    Priority: High    Class: Chronic  . Spinal stenosis of lumbar region 12/10/2018    Priority: High    Class: Chronic  . S/P lumbar spinal fusion 12/10/2018  . Surgery, elective   . Generalized anxiety disorder 09/07/2017  . Essential hypertension 07/31/2017  . Other hyperlipidemia 03/06/2017  . Class 2 obesity with serious comorbidity and body mass index (BMI) of 35.0 to 35.9 in adult 01/08/2017  . Other chronic pain 12/04/2016  . Prediabetes  10/18/2016  . Vitamin D deficiency 10/18/2016  . Depression 11/23/2015  . 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 Chronic lower back pain 06/18/2015   Past Medical History:  Diagnosis Date  . Anemia   . Anxiety   . Arthritis   . Constipation   . Degeneration of cervical intervertebral disc   . Depression   . Fibroids   . 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 Lactose intolerance   . Palpitations   . Shortness of breath   . 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 Diabetes Mother   . Hypertension Mother   . Diabetes Brother   . Hypertension Brother   . Cancer Father   . Endometriosis Daughter   . Diabetes Daughter   . HIV Son     Past Surgical History:  Procedure Laterality Date  . ABDOMINAL HYSTERECTOMY  2007  . CESAREAN SECTION  11/19/1989  . 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 SPINE SURGERY  07/13/2015   Lumbar - Dr. Christia Reading  . TOOTH EXTRACTION  04/16/2017  . TUBAL LIGATION  05/09/1993   Social History   Occupational History  . Occupation: Stay at home spouse    Comment: out of work since  03/2015  Tobacco Use  . Smoking status: Former Smoker    Years: 1.00    Types: Cigarettes  . Smokeless tobacco: Never Used  Substance and Sexual Activity  . Alcohol use: Yes    Alcohol/week: 1.0 standard drinks    Types: 1 Standard drinks or equivalent per week    Comment: social  . Drug use: No  . Sexual activity: Yes    Partners: Female

## 2019-01-07 DIAGNOSIS — M5030 Other cervical disc degeneration, unspecified cervical region: Secondary | ICD-10-CM | POA: Diagnosis not present

## 2019-01-07 DIAGNOSIS — G8929 Other chronic pain: Secondary | ICD-10-CM | POA: Diagnosis not present

## 2019-01-07 DIAGNOSIS — I10 Essential (primary) hypertension: Secondary | ICD-10-CM | POA: Diagnosis not present

## 2019-01-07 DIAGNOSIS — M48061 Spinal stenosis, lumbar region without neurogenic claudication: Secondary | ICD-10-CM | POA: Diagnosis not present

## 2019-01-07 DIAGNOSIS — Z4789 Encounter for other orthopedic aftercare: Secondary | ICD-10-CM | POA: Diagnosis not present

## 2019-01-07 DIAGNOSIS — M5136 Other intervertebral disc degeneration, lumbar region: Secondary | ICD-10-CM | POA: Diagnosis not present

## 2019-01-12 DIAGNOSIS — Z87891 Personal history of nicotine dependence: Secondary | ICD-10-CM | POA: Diagnosis not present

## 2019-01-12 DIAGNOSIS — F329 Major depressive disorder, single episode, unspecified: Secondary | ICD-10-CM | POA: Diagnosis not present

## 2019-01-12 DIAGNOSIS — M5136 Other intervertebral disc degeneration, lumbar region: Secondary | ICD-10-CM | POA: Diagnosis not present

## 2019-01-12 DIAGNOSIS — M5030 Other cervical disc degeneration, unspecified cervical region: Secondary | ICD-10-CM | POA: Diagnosis not present

## 2019-01-12 DIAGNOSIS — M1990 Unspecified osteoarthritis, unspecified site: Secondary | ICD-10-CM | POA: Diagnosis not present

## 2019-01-12 DIAGNOSIS — I10 Essential (primary) hypertension: Secondary | ICD-10-CM | POA: Diagnosis not present

## 2019-01-12 DIAGNOSIS — D649 Anemia, unspecified: Secondary | ICD-10-CM | POA: Diagnosis not present

## 2019-01-12 DIAGNOSIS — Z4789 Encounter for other orthopedic aftercare: Secondary | ICD-10-CM | POA: Diagnosis not present

## 2019-01-12 DIAGNOSIS — K5900 Constipation, unspecified: Secondary | ICD-10-CM | POA: Diagnosis not present

## 2019-01-12 DIAGNOSIS — Z981 Arthrodesis status: Secondary | ICD-10-CM | POA: Diagnosis not present

## 2019-01-12 DIAGNOSIS — Z79891 Long term (current) use of opiate analgesic: Secondary | ICD-10-CM | POA: Diagnosis not present

## 2019-01-12 DIAGNOSIS — M48061 Spinal stenosis, lumbar region without neurogenic claudication: Secondary | ICD-10-CM | POA: Diagnosis not present

## 2019-01-12 DIAGNOSIS — G8929 Other chronic pain: Secondary | ICD-10-CM | POA: Diagnosis not present

## 2019-01-12 DIAGNOSIS — Z905 Acquired absence of kidney: Secondary | ICD-10-CM | POA: Diagnosis not present

## 2019-01-12 DIAGNOSIS — Z6832 Body mass index (BMI) 32.0-32.9, adult: Secondary | ICD-10-CM | POA: Diagnosis not present

## 2019-01-12 DIAGNOSIS — E7849 Other hyperlipidemia: Secondary | ICD-10-CM | POA: Diagnosis not present

## 2019-01-12 DIAGNOSIS — R7303 Prediabetes: Secondary | ICD-10-CM | POA: Diagnosis not present

## 2019-01-12 DIAGNOSIS — E559 Vitamin D deficiency, unspecified: Secondary | ICD-10-CM | POA: Diagnosis not present

## 2019-01-12 DIAGNOSIS — F411 Generalized anxiety disorder: Secondary | ICD-10-CM | POA: Diagnosis not present

## 2019-01-12 DIAGNOSIS — Z9181 History of falling: Secondary | ICD-10-CM | POA: Diagnosis not present

## 2019-01-12 DIAGNOSIS — E669 Obesity, unspecified: Secondary | ICD-10-CM | POA: Diagnosis not present

## 2019-01-13 DIAGNOSIS — I10 Essential (primary) hypertension: Secondary | ICD-10-CM | POA: Diagnosis not present

## 2019-01-13 DIAGNOSIS — Z4789 Encounter for other orthopedic aftercare: Secondary | ICD-10-CM | POA: Diagnosis not present

## 2019-01-13 DIAGNOSIS — M5136 Other intervertebral disc degeneration, lumbar region: Secondary | ICD-10-CM | POA: Diagnosis not present

## 2019-01-13 DIAGNOSIS — M5030 Other cervical disc degeneration, unspecified cervical region: Secondary | ICD-10-CM | POA: Diagnosis not present

## 2019-01-13 DIAGNOSIS — G8929 Other chronic pain: Secondary | ICD-10-CM | POA: Diagnosis not present

## 2019-01-13 DIAGNOSIS — M48061 Spinal stenosis, lumbar region without neurogenic claudication: Secondary | ICD-10-CM | POA: Diagnosis not present

## 2019-01-15 DIAGNOSIS — M5030 Other cervical disc degeneration, unspecified cervical region: Secondary | ICD-10-CM | POA: Diagnosis not present

## 2019-01-15 DIAGNOSIS — M5136 Other intervertebral disc degeneration, lumbar region: Secondary | ICD-10-CM | POA: Diagnosis not present

## 2019-01-15 DIAGNOSIS — M48061 Spinal stenosis, lumbar region without neurogenic claudication: Secondary | ICD-10-CM | POA: Diagnosis not present

## 2019-01-15 DIAGNOSIS — I10 Essential (primary) hypertension: Secondary | ICD-10-CM | POA: Diagnosis not present

## 2019-01-15 DIAGNOSIS — G8929 Other chronic pain: Secondary | ICD-10-CM | POA: Diagnosis not present

## 2019-01-15 DIAGNOSIS — Z4789 Encounter for other orthopedic aftercare: Secondary | ICD-10-CM | POA: Diagnosis not present

## 2019-01-23 ENCOUNTER — Ambulatory Visit: Payer: Medicare Other | Admitting: Specialist

## 2019-01-29 ENCOUNTER — Ambulatory Visit (INDEPENDENT_AMBULATORY_CARE_PROVIDER_SITE_OTHER): Payer: Medicare Other | Admitting: Surgery

## 2019-01-29 ENCOUNTER — Encounter: Payer: Self-pay | Admitting: Surgery

## 2019-01-29 ENCOUNTER — Ambulatory Visit: Payer: Medicare Other

## 2019-01-29 DIAGNOSIS — Z981 Arthrodesis status: Secondary | ICD-10-CM

## 2019-01-29 NOTE — Progress Notes (Signed)
Post-Op Visit Note   [name]           Date of Birth: 11-Mar-1966           MRN: [id number] Visit Date: 01/29/2019 PCP: Bartholome Bill, MD   Assessment & Plan:  Chief Complaint:  Chief Complaint  Patient presents with  . Lower Back - Routine Post Op  53 year old black female who is 7 weeks status post right L3-4 and L4-5 fusion returns.  Patient admits to being noncompliant with wearing her brace.  She has been more time out of it than in.  She has gone to the beach without her brace and has been doing many other activities including picking up her grand kids.  She does acknowledge Dr. Louanne Skye has advised her that she must be in her brace at all times until at least 12 weeks postop.  She does not complain of any lower extremity radiculopathy.  Still having some pain in her lower back. Visit Diagnoses:  1. S/P lumbar fusion     Plan:  Patient has been noncompliant wearing her lumbar brace.  I stressed to her the importance of wearing this as previously instructed by Dr. Louanne Skye and also again by myself.  Understands the risk of pseudoarthrosis, hardware failure, need for repeat surgery, risk of spinal cord injury etc.  She voices understanding and states that she will be compliant.  No bending twisting lifting driving.  Follow-up in 6 weeks with Dr. Louanne Skye for recheck.  Return sooner if needed.  X-rays Lumbar spine today show that her hardware is intact.  Follow-Up Instructions: Return in about 6 weeks (around 03/12/2019) for WITH DR Ewing RECHECK.   Orders:  Orders Placed This Encounter  Procedures  . XR Lumbar Spine 2-3 Views   No orders of the defined types were placed in this encounter.   Imaging: No results found.  PMFS History: Patient Active Problem List   Diagnosis Date Noted  . S/P lumbar spinal fusion 12/10/2018  . Degenerative disc disease, lumbar 12/10/2018    Class: Chronic  . Spinal stenosis of lumbar region 12/10/2018     Class: Chronic  . Surgery, elective   . Generalized anxiety disorder 09/07/2017  . Essential hypertension 07/31/2017  . Other hyperlipidemia 03/06/2017  . Class 2 obesity with serious comorbidity and body mass index (BMI) of 35.0 to 35.9 in adult 01/08/2017  . Other chronic pain 12/04/2016  . Prediabetes 10/18/2016  . Vitamin D deficiency 10/18/2016  . Depression 11/23/2015  . 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 Chronic lower back pain 06/18/2015   Past Medical History:  Diagnosis Date  . Anemia   . Anxiety   . Arthritis   . Constipation   . Degeneration of cervical intervertebral disc   . Depression   . Fibroids   . 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 Lactose intolerance   . Palpitations   . Shortness of breath   . 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 Diabetes Mother   . Hypertension Mother   . Diabetes Brother   . Hypertension Brother   . Cancer Father   . Endometriosis Daughter   . Diabetes Daughter   . HIV Son     Past Surgical History:  Procedure Laterality Date  . ABDOMINAL HYSTERECTOMY  2007  . CESAREAN SECTION  11/19/1989  . NEPHRECTOMY LIVING DONOR Left  donated to her cousin  . SPINE SURGERY  07/13/2015   Lumbar - Dr. Christia Reading  . TOOTH EXTRACTION  04/16/2017  . TUBAL LIGATION  05/09/1993   Social History   Occupational History  . 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 Smoking status: Former Smoker    Years: 1.00    Types: Cigarettes  . Smokeless tobacco: Never Used  Substance and Sexual Activity  . Alcohol use: Yes    Alcohol/week: 1.0 standard drinks    Types: 1 Standard drinks or equivalent per week    Comment: social  . Drug use: No  . Sexual activity: Yes    Partners: Female   Exam Surgical incision well-healed.  Patient ambulating well.  Neurologically intact.

## 2019-03-03 DIAGNOSIS — F331 Major depressive disorder, recurrent, moderate: Secondary | ICD-10-CM | POA: Diagnosis not present

## 2019-03-03 DIAGNOSIS — Z23 Encounter for immunization: Secondary | ICD-10-CM | POA: Diagnosis not present

## 2019-03-03 DIAGNOSIS — F411 Generalized anxiety disorder: Secondary | ICD-10-CM | POA: Diagnosis not present

## 2019-03-03 DIAGNOSIS — I10 Essential (primary) hypertension: Secondary | ICD-10-CM | POA: Diagnosis not present

## 2019-03-12 ENCOUNTER — Ambulatory Visit: Payer: Medicare Other | Admitting: Specialist

## 2019-03-13 ENCOUNTER — Other Ambulatory Visit: Payer: Self-pay

## 2019-03-13 ENCOUNTER — Ambulatory Visit (INDEPENDENT_AMBULATORY_CARE_PROVIDER_SITE_OTHER): Payer: Medicare Other

## 2019-03-13 ENCOUNTER — Encounter: Payer: Self-pay | Admitting: Specialist

## 2019-03-13 ENCOUNTER — Ambulatory Visit (INDEPENDENT_AMBULATORY_CARE_PROVIDER_SITE_OTHER): Payer: Medicare Other | Admitting: Specialist

## 2019-03-13 VITALS — BP 126/81 | HR 82 | Ht 67.0 in | Wt 210.0 lb

## 2019-03-13 DIAGNOSIS — Z981 Arthrodesis status: Secondary | ICD-10-CM

## 2019-03-13 NOTE — Patient Instructions (Signed)
Avoid frequent bending and stooping  No lifting greater than 10 lbs. May use ice or moist heat for pain. Weight loss is of benefit. Can not take NSAIDs due to single kidney.  Exercise is important to improve your indurance and does allow people to function better inspite of back pain. Recommend dicontinuing all narcotic meds. Hemp CDB oil capsules Well padded shoes help.  Hemp CBD capsules, amazon.com 5,000-7,000 mg per bottle, 60 capsules per bottle, take one capsule twice a day.  Follow-Up Instructions: No follow-ups on file.

## 2019-03-13 NOTE — Progress Notes (Signed)
Post-Op Visit Note   [name]           Date of Birth: Oct 14, 1965           MRN: [id number] Visit Date: 03/13/2019 PCP: Bartholome Bill, MD   Assessment & Plan:3 months post 2 level lumbar TLIF. Chief Complaint:  Chief Complaint  Patient presents with  . Lower Back - Routine Post Op, Follow-up   Visit Diagnoses:  1. S/P lumbar fusion   Incision is healed and the legs are NV normal  Wearing her brace. She is gradually stopping her meds taking pain med one time a day at night or in the evening and the Muscle relaxers.  Plan:Avoid frequent bending and stooping  No lifting greater than 10 lbs. May use ice or moist heat for pain. Weight loss is of benefit. Can not take NSAIDs due to single kidney.  Exercise is important to improve your indurance and does allow people to function better inspite of back pain. Recommend dicontinuing all narcotic meds. Hemp CDB oil capsules Well padded shoes help.  Hemp CBD capsules, amazon.com 5,000-7,000 mg per bottle, 60 capsules per bottle, take one capsule twice a day..    Follow-Up Instructions: No follow-ups on file.   Orders:  Orders Placed This Encounter  Procedures  . XR Lumbar Spine 2-3 Views   No orders of the defined types were placed in this encounter.   Imaging: No results found.  PMFS History: Patient Active Problem List   Diagnosis Date Noted  . Degenerative disc disease, lumbar 12/10/2018    Priority: High    Class: Chronic  . Spinal stenosis of lumbar region 12/10/2018    Priority: High    Class: Chronic  . S/P lumbar spinal fusion 12/10/2018  . Surgery, elective   . Generalized anxiety disorder 09/07/2017  . Essential hypertension 07/31/2017  . Other hyperlipidemia 03/06/2017  . Class 2 obesity with serious comorbidity and body mass index (BMI) of 35.0 to 35.9 in adult 01/08/2017  . Other chronic pain 12/04/2016  . Prediabetes 10/18/2016  . Vitamin D deficiency 10/18/2016   . Depression 11/23/2015  . 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 Chronic lower back pain 06/18/2015   Past Medical History:  Diagnosis Date  . Anemia   . Anxiety   . Arthritis   . Constipation   . Degeneration of cervical intervertebral disc   . Depression   . Fibroids   . 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 Lactose intolerance   . Palpitations   . Shortness of breath   . 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 Diabetes Mother   . Hypertension Mother   . Diabetes Brother   . Hypertension Brother   . Cancer Father   . Endometriosis Daughter   . Diabetes Daughter   . HIV Son     Past Surgical History:  Procedure Laterality Date  . ABDOMINAL HYSTERECTOMY  2007  . CESAREAN SECTION  11/19/1989  . 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 SPINE SURGERY  07/13/2015   Lumbar - Dr. Christia Reading  . TOOTH EXTRACTION  04/16/2017  . TUBAL LIGATION  05/09/1993   Social History   Occupational History  . 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 Smoking status: Former Smoker    Years: 1.00  Types: Cigarettes  . Smokeless tobacco: Never Used  Substance and Sexual Activity  . Alcohol use: Yes    Alcohol/week: 1.0 standard drinks    Types: 1 Standard drinks or equivalent per week    Comment: social  . Drug use: No  . Sexual activity: Yes    Partners: Female

## 2019-04-14 DIAGNOSIS — F411 Generalized anxiety disorder: Secondary | ICD-10-CM | POA: Diagnosis not present

## 2019-04-24 ENCOUNTER — Other Ambulatory Visit: Payer: Self-pay

## 2019-04-24 ENCOUNTER — Telehealth: Payer: Self-pay | Admitting: Specialist

## 2019-04-24 ENCOUNTER — Ambulatory Visit (INDEPENDENT_AMBULATORY_CARE_PROVIDER_SITE_OTHER): Payer: Medicare Other | Admitting: Specialist

## 2019-04-24 ENCOUNTER — Ambulatory Visit: Payer: Self-pay

## 2019-04-24 ENCOUNTER — Encounter: Payer: Self-pay | Admitting: Specialist

## 2019-04-24 VITALS — BP 127/84 | HR 85 | Ht 67.0 in | Wt 210.0 lb

## 2019-04-24 DIAGNOSIS — M5417 Radiculopathy, lumbosacral region: Secondary | ICD-10-CM

## 2019-04-24 DIAGNOSIS — Z981 Arthrodesis status: Secondary | ICD-10-CM

## 2019-04-24 DIAGNOSIS — M47816 Spondylosis without myelopathy or radiculopathy, lumbar region: Secondary | ICD-10-CM | POA: Diagnosis not present

## 2019-04-24 DIAGNOSIS — R29898 Other symptoms and signs involving the musculoskeletal system: Secondary | ICD-10-CM | POA: Diagnosis not present

## 2019-04-24 MED ORDER — TRAMADOL HCL 50 MG PO TABS: 100.0000 mg | ORAL_TABLET | Freq: Four times a day (QID) | ORAL | 0 refills | Status: DC | PRN

## 2019-04-24 NOTE — Telephone Encounter (Signed)
[facility] called and stated needed to confirm Tramadol Rx from [facility].  Left vm  [phone]

## 2019-04-24 NOTE — Progress Notes (Signed)
Office Visit Note   [name]           Date of Birth: 05/25/1965           MRN: [id number] Visit Date: 04/24/2019              Requested by: Sarah Duncan, Sarah Duncan,  Sarah Duncan [id number] PCP: Sarah Duncan   Assessment & Plan: Visit Diagnoses:  1. S/P lumbar fusion   2. History of lumbar spinal fusion   3. Weakness of right leg   4. Lumbosacral radiculopathy   5. Spondylosis without myelopathy or radiculopathy, lumbar region     Plan: Avoid bending, stooping and avoid lifting weights greater than 10 lbs. Avoid prolong standing and walking. Avoid frequent bending and stooping  No lifting greater than 10 lbs. May use ice or moist heat for pain. Weight loss is of benefit. Handicap license is approved. Dr. Romona Duncan secretary/Assistant will call to arrange for facet steroid injection above fusion and EMG/NCV of the right leg to assess weakness.  Try to go without the brace.    Follow-Up Instructions: No follow-ups on file.   Orders:  Orders Placed This Encounter  Procedures  . XR Lumbar Spine 2-3 Views  . Ambulatory referral to Physical Medicine Rehab  . Ambulatory referral to Physical Medicine Rehab   Meds ordered this encounter  Medications  . traMADol (ULTRAM) 50 MG tablet    Sig: Take 2 tablets (100 mg total) by mouth every 6 (six) hours as needed.    Dispense:  40 tablet    Refill:  0      Procedures: No procedures performed   Clinical Data: No additional findings.   Subjective: Chief Complaint  Patient presents with  . Lower Back - Follow-up    53 year old female with history of L3-4 and L4-5 TLIFs for DDD and foramenal stenosis. She is still using the brace for 2 hours a day. She reports some return of pain similar to that she had prior to surgery. She is taking tylenol arthritis and is wondering what else can be used for pain.  She has central low back pain with prolong  standing and walking and she has to sit if she stands for more than 10-15 minutes. No bowel or bladder difficulties. The pain is about an 8 of 10. Right now it is a 6. She has not taken much meds today. She feels the pain right now, she has pain also at night when at bed, takes  Tylenol to get the edge off the pain. She will use hot shower, not ice yet. She is avoiding bending and stooping. Had xrays today and bending backwards increased the pain.    Review of Systems  Constitutional: Negative.   HENT: Negative.   Eyes: Negative.   Respiratory: Negative.   Cardiovascular: Negative.   Gastrointestinal: Negative.   Endocrine: Positive for cold intolerance.  Genitourinary: Negative.   Musculoskeletal: Positive for back pain. Negative for arthralgias, gait problem, joint swelling, myalgias, neck pain and neck stiffness.  Skin: Negative.  Negative for color change, pallor, rash and wound.     Objective: Vital Signs: BP 127/84 (BP Location: Left Arm, Patient Position: Sitting)   Pulse 85   Ht 5\' 7"  (1.702 m)   Wt 210 lb (95.3 kg)   BMI 32.89 kg/m   Physical Exam Constitutional:      Appearance: She is well-developed.  HENT:  Head: Normocephalic and atraumatic.  Eyes:     Pupils: Pupils are equal, round, and reactive to light.  Neck:     Musculoskeletal: Normal range of motion and neck supple.  Pulmonary:     Effort: Pulmonary effort is normal.     Breath sounds: Normal breath sounds.  Abdominal:     General: Bowel sounds are normal.     Palpations: Abdomen is soft.  Musculoskeletal: Normal range of motion.  Skin:    General: Skin is warm and dry.  Neurological:     Mental Status: She is alert and oriented to person, place, and time.  Psychiatric:        Behavior: Behavior normal.        Thought Content: Thought content normal.        Judgment: Judgment normal.     Ortho Exam  Specialty Comments:  No specialty comments available.  Imaging: No results found.    PMFS History: Patient Active Problem List   Diagnosis Date Noted  . Degenerative disc disease, lumbar 12/10/2018    Priority: High    Class: Chronic  . Spinal stenosis of lumbar region 12/10/2018    Priority: High    Class: Chronic  . S/P lumbar spinal fusion 12/10/2018  . Surgery, elective   . Generalized anxiety disorder 09/07/2017  . Essential hypertension 07/31/2017  . Other hyperlipidemia 03/06/2017  . Class 2 obesity with serious comorbidity and body mass index (BMI) of 35.0 to 35.9 in adult 01/08/2017  . Other chronic pain 12/04/2016  . Prediabetes 10/18/2016  . Vitamin D deficiency 10/18/2016  . Depression 11/23/2015  . 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 Chronic lower back pain 06/18/2015   Past Medical History:  Diagnosis Date  . Anemia   . Anxiety   . Arthritis   . Constipation   . Degeneration of cervical intervertebral disc   . Depression   . Fibroids   . 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 Lactose intolerance   . Palpitations   . Shortness of breath   . 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 Diabetes Mother   . Hypertension Mother   . Diabetes Brother   . Hypertension Brother   . Cancer Father   . Endometriosis Daughter   . Diabetes Daughter   . HIV Son     Past Surgical History:  Procedure Laterality Date  . ABDOMINAL HYSTERECTOMY  2007  . CESAREAN SECTION  11/19/1989  . 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 SPINE SURGERY  07/13/2015   Lumbar - Dr. Christia Reading  . TOOTH EXTRACTION  04/16/2017  . TUBAL LIGATION  05/09/1993   Social History   Occupational History  . 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 Smoking status: Former Smoker    Years: 1.00    Types: Cigarettes  . Smokeless tobacco: Never Used  Substance and Sexual Activity  . Alcohol use: Yes    Alcohol/week: 1.0 standard drinks    Types:  1 Standard drinks or equivalent per week    Comment: social  . Drug use: No  . Sexual activity: Yes    Partners: Female

## 2019-04-24 NOTE — Telephone Encounter (Signed)
I called pharmacist and advised that they need a DX for pain meds, I adivsed she is 4.5 months post lumbar fusion and she is having increased pain again like she had before surgery.

## 2019-04-24 NOTE — Patient Instructions (Signed)
Plan: Avoid bending, stooping and avoid lifting weights greater than 10 lbs. Avoid prolong standing and walking. Avoid frequent bending and stooping  No lifting greater than 10 lbs. May use ice or moist heat for pain. Weight loss is of benefit. Handicap license is approved. Dr. Romona Curls secretary/Assistant will call to arrange for facet steroid injection above fusion and EMG/NCV of the right leg to assess weakness.  Try to go without the brace.

## 2019-05-05 ENCOUNTER — Telehealth: Payer: Self-pay | Admitting: Physical Medicine and Rehabilitation

## 2019-05-06 ENCOUNTER — Telehealth: Payer: Self-pay | Admitting: *Deleted

## 2019-05-06 NOTE — Telephone Encounter (Signed)
Pt is scheduled for 05/28/2019 for injection with driver.

## 2019-05-22 ENCOUNTER — Other Ambulatory Visit: Payer: Self-pay | Admitting: Physical Medicine and Rehabilitation

## 2019-05-22 DIAGNOSIS — F411 Generalized anxiety disorder: Secondary | ICD-10-CM

## 2019-05-22 MED ORDER — DIAZEPAM 5 MG PO TABS: ORAL_TABLET | ORAL | 0 refills | Status: DC

## 2019-05-22 NOTE — Telephone Encounter (Signed)
Called pt and lvm to advise.

## 2019-05-22 NOTE — Telephone Encounter (Signed)
done

## 2019-05-22 NOTE — Progress Notes (Signed)
Pre-procedure diazepam ordered for pre-operative anxiety.  

## 2019-05-28 ENCOUNTER — Ambulatory Visit: Payer: Self-pay

## 2019-05-28 ENCOUNTER — Ambulatory Visit: Payer: Medicare Other | Admitting: Specialist

## 2019-05-28 ENCOUNTER — Encounter: Payer: Self-pay | Admitting: Physical Medicine and Rehabilitation

## 2019-05-28 ENCOUNTER — Other Ambulatory Visit: Payer: Self-pay

## 2019-05-28 ENCOUNTER — Ambulatory Visit (INDEPENDENT_AMBULATORY_CARE_PROVIDER_SITE_OTHER): Payer: Medicare Other | Admitting: Physical Medicine and Rehabilitation

## 2019-05-28 VITALS — BP 139/87 | HR 80

## 2019-05-28 DIAGNOSIS — M47816 Spondylosis without myelopathy or radiculopathy, lumbar region: Secondary | ICD-10-CM | POA: Diagnosis not present

## 2019-05-28 MED ORDER — METHYLPREDNISOLONE ACETATE 80 MG/ML IJ SUSP
40.0000 mg | Freq: Once | INTRAMUSCULAR | Status: AC
  Administered 2019-05-28: 40 mg

## 2019-05-28 NOTE — Progress Notes (Signed)
Sarah Duncan - 54 y.o. female MRN [id number]  Date of birth: Aug 26, 1965  Office Visit Note: Visit Date: 05/28/2019 PCP: Bartholome Bill, MD Referred by: Bartholome Bill, MD  Subjective: Chief Complaint  Patient presents with  . Lower Back - Pain   HPI:  Sarah Duncan is a 54 y.o. female who comes in today At the request of Dr. [name] for bilateral L2-3 facet joint blocks above her fusion.  Patient has chronic worsening lower back pain quite severe and unremitting.  She has failed conservative and nonconservative care.  ROS Otherwise per HPI.  Assessment & Plan: Visit Diagnoses:  1. Spondylosis without myelopathy or radiculopathy, lumbar region     Plan: No additional findings.   Meds & Orders:  Meds ordered this encounter  Medications  . methylPREDNISolone acetate (DEPO-MEDROL) injection 40 mg    Orders Placed This Encounter  Procedures  . Facet Injection  . XR C-ARM NO REPORT    Follow-up: Return for [name], M.D..   Procedures: No procedures performed  Lumbar Facet Joint Intra-Articular Injection(s) with Fluoroscopic Guidance  [name]      Date of Birth: Jun 22, 1965 MRN: [id number] PCP: Bartholome Bill, MD      Visit Date: 05/28/2019   Universal Protocol:    Date/Time: 05/28/2019  Consent Given By: the patient  Position: PRONE   Additional Comments: Vital signs were monitored before and after the procedure. Patient was prepped and draped in the usual sterile fashion. The correct patient, procedure, and site was verified.   Injection Procedure Details:  Procedure Site One Meds Administered:  Meds ordered this encounter  Medications  . methylPREDNISolone acetate (DEPO-MEDROL) injection 40 mg     Laterality: Bilateral  Location/Site:  L2-L3  Needle size: 22 guage  Needle type: Spinal  Needle Placement: Articular  Findings:  -Comments: Excellent flow of contrast  producing a partial arthrogram.  Procedure Details: The fluoroscope beam is vertically oriented in AP, and the inferior recess is visualized beneath the lower pole of the inferior apophyseal process, which represents the target point for needle insertion. When direct visualization is difficult the target point is located at the medial projection of the vertebral pedicle. The region overlying each aforementioned target is locally anesthetized with a 1 to 2 ml. volume of 1% Lidocaine without Epinephrine.   The spinal needle was inserted into each of the above mentioned facet joints using biplanar fluoroscopic guidance. A 0.25 to 0.5 ml. volume of Isovue-250 was injected and a partial facet joint arthrogram was obtained. A single spot film was obtained of the resulting arthrogram.    One to 1.25 ml of the steroid/anesthetic solution was then injected into each of the facet joints noted above.   Additional Comments:  The patient tolerated the procedure well Dressing: 2 x 2 sterile gauze and Band-Aid    Post-procedure details: Patient was observed during the procedure. Post-procedure instructions were reviewed.  Patient left the clinic in stable condition.     Clinical History: MRI LUMBAR SPINE WITHOUT CONTRAST  TECHNIQUE: Multiplanar, multisequence MR imaging of the lumbar spine was performed. No intravenous contrast was administered.  COMPARISON:  Lumbar spine MRI 04/30/2016  FINDINGS: Segmentation:  Standard.  Alignment:  Physiologic.  Vertebrae:  No fracture, evidence of discitis, or bone lesion.  Conus medullaris and cauda equina: Conus extends to the T12 level. Conus and cauda equina appear normal.  Paraspinal and other soft tissues: The visualized vascular, retroperitoneal and paraspinal structures  are normal.  Disc levels:  The disc spaces from T10 to L3 are normal.  L3-L4: Small right subarticular disc protrusion and mild facet hypertrophy. Mild narrowing  of the right lateral recess, unchanged. No spinal canal stenosis or neural foraminal stenosis.  L4-L5: Disc space narrowing severe right and moderate left facet hypertrophy, unchanged. No spinal canal stenosis. Narrowing of the right lateral recess is worsened. Moderate left neural foraminal stenosis is unchanged.  L5-S1: No disc herniation or stenosis.  IMPRESSION: 1. 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 2. Mild right L3-4 lateral recess narrowing.   Electronically Signed   By: Ulyses Jarred M.D.   On: 07/09/2017 04:23     Objective:  VS:  HT:    WT:   BMI:     BP:139/87  HR:80bpm  TEMP: ( )  RESP:  Physical Exam  Ortho Exam Imaging: No results found.

## 2019-05-28 NOTE — Progress Notes (Signed)
.  Numeric Pain Rating Scale and Functional Assessment Average Pain 7   In the last MONTH (on 0-10 scale) has pain interfered with the following?  1. General activity like being  able to carry out your everyday physical activities such as walking, climbing stairs, carrying groceries, or moving a chair?  Rating(7)   +Driver, -BT, -Dye Allergies.   

## 2019-05-28 NOTE — Procedures (Signed)
Lumbar Facet Joint Intra-Articular Injection(s) with Fluoroscopic Guidance  [name]      Date of Birth: April 12, 1966 MRN: [id number] PCP: Bartholome Bill, MD      Visit Date: 05/28/2019   Universal Protocol:    Date/Time: 05/28/2019  Consent Given By: the patient  Position: PRONE   Additional Comments: Vital signs were monitored before and after the procedure. Patient was prepped and draped in the usual sterile fashion. The correct patient, procedure, and site was verified.   Injection Procedure Details:  Procedure Site One Meds Administered:  Meds ordered this encounter  Medications  . methylPREDNISolone acetate (DEPO-MEDROL) injection 40 mg     Laterality: Bilateral  Location/Site:  L2-L3  Needle size: 22 guage  Needle type: Spinal  Needle Placement: Articular  Findings:  -Comments: Excellent flow of contrast producing a partial arthrogram.  Procedure Details: The fluoroscope beam is vertically oriented in AP, and the inferior recess is visualized beneath the lower pole of the inferior apophyseal process, which represents the target point for needle insertion. When direct visualization is difficult the target point is located at the medial projection of the vertebral pedicle. The region overlying each aforementioned target is locally anesthetized with a 1 to 2 ml. volume of 1% Lidocaine without Epinephrine.   The spinal needle was inserted into each of the above mentioned facet joints using biplanar fluoroscopic guidance. A 0.25 to 0.5 ml. volume of Isovue-250 was injected and a partial facet joint arthrogram was obtained. A single spot film was obtained of the resulting arthrogram.    One to 1.25 ml of the steroid/anesthetic solution was then injected into each of the facet joints noted above.   Additional Comments:  The patient tolerated the procedure well Dressing: 2 x 2 sterile gauze and Band-Aid    Post-procedure  details: Patient was observed during the procedure. Post-procedure instructions were reviewed.  Patient left the clinic in stable condition.

## 2019-06-11 ENCOUNTER — Ambulatory Visit (INDEPENDENT_AMBULATORY_CARE_PROVIDER_SITE_OTHER): Payer: Medicare Other | Admitting: Specialist

## 2019-06-11 ENCOUNTER — Other Ambulatory Visit: Payer: Self-pay

## 2019-06-11 ENCOUNTER — Encounter: Payer: Self-pay | Admitting: Specialist

## 2019-06-11 VITALS — BP 140/90 | HR 76 | Ht 67.0 in | Wt 210.0 lb

## 2019-06-11 DIAGNOSIS — M5137 Other intervertebral disc degeneration, lumbosacral region: Secondary | ICD-10-CM | POA: Diagnosis not present

## 2019-06-11 DIAGNOSIS — Z981 Arthrodesis status: Secondary | ICD-10-CM

## 2019-06-11 DIAGNOSIS — M5417 Radiculopathy, lumbosacral region: Secondary | ICD-10-CM

## 2019-06-11 MED ORDER — HYDROCODONE-ACETAMINOPHEN 5-325 MG PO TABS: 1.0000 | ORAL_TABLET | Freq: Four times a day (QID) | ORAL | 0 refills | Status: DC | PRN

## 2019-06-11 MED ORDER — DIAZEPAM 5 MG PO TABS: ORAL_TABLET | ORAL | 0 refills | Status: DC

## 2019-06-11 NOTE — Patient Instructions (Addendum)
Avoid frequent bending and stooping  No lifting greater than 10 lbs. May use ice or moist heat for pain. Weight loss is of benefit. Best medication for lumbar disc disease is arthritis medications like motrin, celebrex and naprosyn. But you can not take these due to having only a single kidney so use of a narcotic is appropriate while we are working up the cause of the pain. Exercise is important to improve your indurance and does allow people to function better inspite of back pain. MRI lumbar without contrast to assess for worsening changes at the L5-S1 due to stress from the 2 level fusion above this level.

## 2019-06-11 NOTE — Progress Notes (Signed)
Office Visit Note   [name]           Date of Birth: Feb 06, 1966           MRN: [id number] Visit Date: 06/11/2019              Requested by: Bartholome Bill, [place] [place],  [place] [id number] PCP: Harrison Mons, PA   Assessment & Plan: Visit Diagnoses:  1. History of lumbar spinal fusion   2. DDD (degenerative disc disease), lumbosacral   3. Lumbosacral radiculopathy     Plan: Avoid frequent bending and stooping  No lifting greater than 10 lbs. May use ice or moist heat for pain. Weight loss is of benefit. Best medication for lumbar disc disease is arthritis medications like motrin, celebrex and naprosyn. But you can not take these due to having only a single kidney so use of a narcotic is appropriate while we are working up the cause of the pain. Exercise is important to improve your indurance and does allow people to function better inspite of back pain. MRI lumbar without contrast to assess for worsening changes at the L5-S1 due to stress from the 2 level fusion above this level.   Follow-Up Instructions: Return in about 3 weeks (around 07/02/2019).   Orders:  Orders Placed This Encounter  Procedures  . MR Lumbar Spine w/o contrast   Meds ordered this encounter  Medications  . HYDROcodone-acetaminophen (NORCO/VICODIN) 5-325 MG tablet    Sig: Take 1 tablet by mouth every 6 (six) hours as needed for moderate pain.    Dispense:  30 tablet    Refill:  0  . diazepam (VALIUM) 5 MG tablet    Sig: Take one tablet at the MRI and may repeat one time after 30 minutes if no effect.    Dispense:  30 tablet    Refill:  0      Procedures: No procedures performed   Clinical Data: No additional findings.   Subjective: Chief Complaint  Patient presents with  . Lower Back - Follow-up    She had Bilateral L2-3 Facet -she states that it did not help her at all.    54 year old female with history of L3-4 and L4-5  fusions for degenerative disc disease and disc protrusions. She reports the most recent Facet injections didn't do any good. The tramadol did help with pain. And she is still hurting, takes zanaflex, NKA, has one kidney.  Took valium for the facet injections. She is to undergo emg/ncvs in the near future.    Review of Systems   Objective: Vital Signs: BP 140/90 (BP Location: Left Arm, Patient Position: Sitting)   Pulse 76   Ht 5\' 7"  (1.702 m)   Wt 210 lb (95.3 kg)   BMI 32.89 kg/m   Physical Exam  Ortho Exam  Specialty Comments:  No specialty comments available.  Imaging: No results found.   PMFS History: Patient Active Problem List   Diagnosis Date Noted  . Degenerative disc disease, lumbar 12/10/2018    Priority: High    Class: Chronic  . Spinal stenosis of lumbar region 12/10/2018    Priority: High    Class: Chronic  . S/P lumbar spinal fusion 12/10/2018  . Surgery, elective   . Generalized anxiety disorder 09/07/2017  . Essential hypertension 07/31/2017  . Other hyperlipidemia 03/06/2017  . Class 2 obesity with serious comorbidity and body mass index (BMI) of 35.0 to  35.9 in adult 01/08/2017  . Other chronic pain 12/04/2016  . Prediabetes 10/18/2016  . Vitamin D deficiency 10/18/2016  . Depression 11/23/2015  . 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 Chronic lower back pain 06/18/2015   Past Medical History:  Diagnosis Date  . Anemia   . Anxiety   . Arthritis   . Constipation   . Degeneration of cervical intervertebral disc   . Depression   . Fibroids   . 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 Lactose intolerance   . Palpitations   . Shortness of breath   . 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 Diabetes Mother   . Hypertension Mother   . Diabetes Brother   . Hypertension Brother   . Cancer Father   . Endometriosis Daughter   . Diabetes Daughter   . HIV Son     Past  Surgical History:  Procedure Laterality Date  . ABDOMINAL HYSTERECTOMY  2007  . CESAREAN SECTION  11/19/1989  . 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 SPINE SURGERY  07/13/2015   Lumbar - Dr. Christia Reading  . TOOTH EXTRACTION  04/16/2017  . TUBAL LIGATION  05/09/1993   Social History   Occupational History  . 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 Smoking status: Former Smoker    Years: 1.00    Types: Cigarettes  . Smokeless tobacco: Never Used  Substance and Sexual Activity  . Alcohol use: Yes    Alcohol/week: 1.0 standard drinks    Types: 1 Standard drinks or equivalent per week    Comment: social  . Drug use: No  . Sexual activity: Yes    Partners: Female

## 2019-07-03 ENCOUNTER — Ambulatory Visit: Payer: Medicare Other | Admitting: Specialist

## 2019-07-03 ENCOUNTER — Encounter: Payer: Self-pay | Admitting: Specialist

## 2019-07-03 ENCOUNTER — Other Ambulatory Visit: Payer: Self-pay

## 2019-07-03 VITALS — BP 116/79 | HR 77 | Ht 67.0 in | Wt 220.0 lb

## 2019-07-03 DIAGNOSIS — M5136 Other intervertebral disc degeneration, lumbar region: Secondary | ICD-10-CM | POA: Diagnosis not present

## 2019-07-03 DIAGNOSIS — Z981 Arthrodesis status: Secondary | ICD-10-CM

## 2019-07-03 MED ORDER — TRAMADOL HCL 50 MG PO TABS: 100.0000 mg | ORAL_TABLET | Freq: Four times a day (QID) | ORAL | 0 refills | Status: AC | PRN

## 2019-07-03 NOTE — Progress Notes (Signed)
Office Visit Note   [name]           Date of Birth: 1965/11/22           MRN: [id number] Visit Date: 07/03/2019              Requested by: Harrison Mons, [address] [place],  [place] [id number] PCP: Harrison Mons, PA   Assessment & Plan: Visit Diagnoses:  1. S/P lumbar fusion   2. Other intervertebral disc degeneration, lumbar region     Plan: Avoid frequent bending and stooping  No lifting greater than 10 lbs. May use ice or moist heat for pain. Weight loss is of benefit. Best medication for lumbar disc disease is arthritis medications like motrin, celebrex and naprosyn but you can not take arthritis meds due to having a single kidney.. Exercise is important to improve your indurance and does allow people to function better inspite of back pain.  Hemp CBD capsules, amazon.com 5,000-7,000 mg per bottle, 60 capsules per bottle, take one capsule twice a day.  Follow-Up Instructions: No follow-ups on file.    Follow-Up Instructions: No follow-ups on file.   Orders:  No orders of the defined types were placed in this encounter.  No orders of the defined types were placed in this encounter.     Procedures: No procedures performed   Clinical Data: No additional findings.   Subjective: Chief Complaint  Patient presents with  . Lower Back - Follow-up    MRI Review    54 year old female with history of previous lumbar fusion surgery for DDD, lumbar spinal stenosis and herniated discs with TLIFs L4-5 and L5-S1. Most of her pain now is in the lumbar spine at the lumbosacral junction and into the SI areas and both buttocks. There is no pain with cough of sneezing, much of the pain is associated with mechanical movement, bending, twisting sitting and standing. She has not been to PT as yet.  Had home PT post op and has not been to PT post discontinuing the brace. She report able to due 15 minutes of cardio Before her back  started burning with discomfort.    Review of Systems  Constitutional: Negative.  Negative for activity change, appetite change, chills, diaphoresis, fatigue, fever and unexpected weight change.  HENT: Negative.  Negative for congestion, dental problem, drooling, ear discharge, ear pain, facial swelling, hearing loss, mouth sores, nosebleeds, postnasal drip, rhinorrhea, sinus pressure, sinus pain, sneezing, sore throat, tinnitus, trouble swallowing and voice change.   Eyes: Negative.  Negative for photophobia, pain, discharge, redness, itching and visual disturbance.  Respiratory: Negative.  Negative for apnea, cough, choking, chest tightness, shortness of breath and stridor.   Cardiovascular: Negative.  Negative for chest pain, palpitations and leg swelling.  Gastrointestinal: Negative.  Negative for abdominal distention, abdominal pain, anal bleeding, blood in stool, constipation, diarrhea, nausea, rectal pain and vomiting.  Genitourinary: Negative.  Negative for difficulty urinating, dyspareunia, dysuria, enuresis, flank pain, frequency and hematuria.  Musculoskeletal: Positive for back pain. Negative for arthralgias, gait problem, joint swelling, myalgias, neck pain and neck stiffness.  Skin: Negative.   Allergic/Immunologic: Negative for environmental allergies, food allergies and immunocompromised state.  Neurological: Positive for weakness, light-headedness and numbness. Negative for dizziness, facial asymmetry and headaches.  Hematological: Negative for adenopathy. Does not bruise/bleed easily.  Psychiatric/Behavioral: Negative.  Negative for agitation, behavioral problems, confusion, decreased concentration, dysphoric mood, hallucinations, self-injury, sleep disturbance and suicidal ideas. The patient  is not nervous/anxious and is not hyperactive.      Objective: Vital Signs: BP 116/79 (BP Location: Left Arm, Patient Position: Sitting)   Pulse 77   Ht 5\' 7"  (1.702 m)   Wt 220 lb  (99.8 kg)   BMI 34.46 kg/m   Physical Exam Constitutional:      Appearance: She is well-developed.  HENT:     Head: Normocephalic and atraumatic.  Eyes:     Pupils: Pupils are equal, round, and reactive to light.  Pulmonary:     Effort: Pulmonary effort is normal.     Breath sounds: Normal breath sounds.  Abdominal:     General: Bowel sounds are normal.     Palpations: Abdomen is soft.  Musculoskeletal:     Cervical back: Normal range of motion and neck supple.     Lumbar back: Negative right straight leg raise test and negative left straight leg raise test.  Skin:    General: Skin is warm and dry.  Neurological:     Mental Status: She is alert and oriented to person, place, and time.  Psychiatric:        Behavior: Behavior normal.        Thought Content: Thought content normal.        Judgment: Judgment normal.     Back Exam   Tenderness  The patient is experiencing tenderness in the lumbar and sacroiliac.  Range of Motion  Extension: abnormal  Flexion: abnormal  Lateral bend right: abnormal  Lateral bend left: abnormal  Rotation right: abnormal  Rotation left: abnormal   Muscle Strength  Right Quadriceps:  5/5  Left Quadriceps:  5/5  Right Hamstrings:  5/5  Left Hamstrings:  5/5   Tests  Straight leg raise right: negative Straight leg raise left: negative  Reflexes  Patellar: 2/4 Achilles: 2/4 Biceps: 2/4  Other  Toe walk: normal Heel walk: normal      Specialty Comments:  No specialty comments available.  Imaging: No results found.   PMFS History: Patient Active Problem List   Diagnosis Date Noted  . Degenerative disc disease, lumbar 12/10/2018    Priority: High    Class: Chronic  . Spinal stenosis of lumbar region 12/10/2018    Priority: High    Class: Chronic  . S/P lumbar spinal fusion 12/10/2018  . Surgery, elective   . Generalized anxiety disorder 09/07/2017  . Essential hypertension 07/31/2017  . Other hyperlipidemia  03/06/2017  . Class 2 obesity with serious comorbidity and body mass index (BMI) of 35.0 to 35.9 in adult 01/08/2017  . Other chronic pain 12/04/2016  . Prediabetes 10/18/2016  . Vitamin D deficiency 10/18/2016  . Depression 11/23/2015  . 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 Chronic lower back pain 06/18/2015   Past Medical History:  Diagnosis Date  . Anemia   . Anxiety   . Arthritis   . Constipation   . Degeneration of cervical intervertebral disc   . Depression   . Fibroids   . 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 Lactose intolerance   . Palpitations   . Shortness of breath   . 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 Diabetes Mother   . Hypertension Mother   . Diabetes Brother   . Hypertension Brother   . Cancer Father   . Endometriosis Daughter   . Diabetes Daughter   . HIV Son  Past Surgical History:  Procedure Laterality Date  . ABDOMINAL HYSTERECTOMY  2007  . CESAREAN SECTION  11/19/1989  . 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 SPINE SURGERY  07/13/2015   Lumbar - Dr. Christia Reading  . TOOTH EXTRACTION  04/16/2017  . TUBAL LIGATION  05/09/1993   Social History   Occupational History  . 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 Smoking status: Former Smoker    Years: 1.00    Types: Cigarettes  . Smokeless tobacco: Never Used  Substance and Sexual Activity  . Alcohol use: Yes    Alcohol/week: 1.0 standard drinks    Types: 1 Standard drinks or equivalent per week    Comment: social  . Drug use: No  . Sexual activity: Yes    Partners: Female

## 2019-07-16 ENCOUNTER — Encounter (INDEPENDENT_AMBULATORY_CARE_PROVIDER_SITE_OTHER): Payer: Medicare Other | Admitting: Neurology

## 2019-07-16 ENCOUNTER — Ambulatory Visit: Payer: Medicare Other | Admitting: Neurology

## 2019-07-16 ENCOUNTER — Other Ambulatory Visit: Payer: Self-pay

## 2019-07-16 DIAGNOSIS — M79604 Pain in right leg: Secondary | ICD-10-CM | POA: Diagnosis not present

## 2019-07-16 DIAGNOSIS — Z0289 Encounter for other administrative examinations: Secondary | ICD-10-CM

## 2019-07-16 NOTE — Procedures (Signed)
Full Name: Sarah Duncan Gender: Female MRN #: [id number] Date of Birth: 05-Apr-1966    Visit Date: 07/16/2019 08:57 Age: 54 Years Examining Physician: Marcial Pacas, MD  Referring Physician: [name], MD History: 54 year old female, with history of lumbar decompression surgery, continue complains of intermittent right leg radiating pain, frequent right leg tremor  Summary of the tests:  Nerve conduction study: Bilateral sural, superficial peroneal sensory responses were normal.  Bilateral tibial, peroneal to EDB motor responses were normal.  Bilateral tibial H reflexes were normal and symmetric  Electromyography:  Selected needle examination of right lower extremity muscles and right lumbosacral paraspinal muscles were normal.  Conclusion: This is a normal study.  There is no electrodiagnostic evidence of right lower extremity neuropathy or right lumbosacral radiculopathy.    ------------------------------- Marcial Pacas, M.D. PhD  [facility] [place],  [address] Tel: [phone] Fax: [phone]         [facility]    Nerve / Sites Muscle Latency Ref. Amplitude Ref. Rel Amp Segments Distance Velocity Ref. Area    ms ms mV mV %  cm m/s m/s mVms  R Peroneal - EDB     Ankle EDB 4.3 ?6.5 7.5 ?2.0 100 Ankle - EDB 9   19.1     Fib head EDB 11.4  6.8  90.5 Fib head - Ankle 34 48 ?44 18.4     Pop fossa EDB 13.5  6.4  94.7 Pop fossa - Fib head 10 48 ?44 17.9         Pop fossa - Ankle      L Peroneal - EDB     Ankle EDB 4.3 ?6.5 8.8 ?2.0 100 Ankle - EDB 9   24.5     Fib head EDB 10.6  7.7  87 Fib head - Ankle 34 54 ?44 23.2     Pop fossa EDB 12.5  7.4  96.5 Pop fossa - Fib head 10 54 ?44 22.7         Pop fossa - Ankle      R Tibial - AH     Ankle AH 4.8 ?5.8 7.5 ?4.0 100 Ankle - AH 9   12.9     Pop fossa AH 13.4  6.2  82.5 Pop fossa - Ankle 40 46 ?41 14.7  L Tibial - AH     Ankle AH 4.0 ?5.8 5.1 ?4.0 100 Ankle - AH 9   9.2    Pop fossa AH 13.0  3.3  64.3 Pop fossa - Ankle 39 43 ?41 16.3             SNC    Nerve / Sites Rec. Site Peak Lat Ref.  Amp Ref. Segments Distance    ms ms V V  cm  R Sural - Ankle (Calf)     Calf Ankle 3.6 ?4.4 26 ?6 Calf - Ankle 14  L Sural - Ankle (Calf)     Calf Ankle 3.8 ?4.4 18 ?6 Calf - Ankle 14  R Superficial peroneal - Ankle     Lat leg Ankle 3.8 ?4.4 9 ?6 Lat leg - Ankle 14  L Superficial peroneal - Ankle     Lat leg Ankle 4.1 ?4.4 10 ?6 Lat leg - Ankle 14             F  Wave    Nerve F Lat Ref.   ms ms  R Tibial - AH 55.4 ?56.0  L Tibial - AH 55.7 ?56.0         H Reflex    Nerve H Lat   ms   Left Right Ref.  Tibial - Soleus 38.8 38.8 ?35.0         EMG Summary Table    Spontaneous MUAP Recruitment  Muscle IA Fib PSW Fasc Other Amp Dur. Poly Pattern  R. Tibialis anterior Normal None None None _______ Normal Normal Normal Normal  R. Tibialis posterior Normal None None None _______ Normal Normal Normal Normal  R. Peroneus longus Normal None None None _______ Normal Normal Normal Normal  R. Gastrocnemius (Medial head) Normal None None None _______ Normal Normal Normal Normal  R. Vastus lateralis Normal None None None _______ Normal Normal Normal Normal  R. Lumbar paraspinals (low) Normal None None None _______ Normal Normal Normal Normal  R. Lumbar paraspinals (mid) Normal None None None _______ Normal Normal Normal Normal

## 2019-07-18 ENCOUNTER — Ambulatory Visit: Payer: Medicare Other | Admitting: Rehabilitative and Restorative Service Providers"

## 2019-08-14 ENCOUNTER — Ambulatory Visit: Payer: Medicare Other | Admitting: Specialist

## 2020-12-16 ENCOUNTER — Encounter: Payer: Self-pay | Admitting: Physical Medicine & Rehabilitation

## 2020-12-26 ENCOUNTER — Emergency Department (HOSPITAL_COMMUNITY)
Admission: EM | Admit: 2020-12-26 | Discharge: 2020-12-26 | Disposition: A | Discharge status: Home / Self Care | Payer: Medicare (Managed Care) | Attending: Emergency Medicine | Admitting: Emergency Medicine

## 2020-12-26 ENCOUNTER — Emergency Department (HOSPITAL_COMMUNITY): Payer: Medicare (Managed Care)

## 2020-12-26 DIAGNOSIS — R209 Unspecified disturbances of skin sensation: Secondary | ICD-10-CM | POA: Insufficient documentation

## 2020-12-26 DIAGNOSIS — R569 Unspecified convulsions: Secondary | ICD-10-CM | POA: Insufficient documentation

## 2020-12-26 DIAGNOSIS — N9489 Other specified conditions associated with female genital organs and menstrual cycle: Secondary | ICD-10-CM | POA: Diagnosis not present

## 2020-12-26 DIAGNOSIS — Z79899 Other long term (current) drug therapy: Secondary | ICD-10-CM | POA: Diagnosis not present

## 2020-12-26 DIAGNOSIS — T50902A Poisoning by unspecified drugs, medicaments and biological substances, intentional self-harm, initial encounter: Secondary | ICD-10-CM

## 2020-12-26 DIAGNOSIS — Z87891 Personal history of nicotine dependence: Secondary | ICD-10-CM | POA: Diagnosis not present

## 2020-12-26 DIAGNOSIS — T50992A Poisoning by other drugs, medicaments and biological substances, intentional self-harm, initial encounter: Secondary | ICD-10-CM | POA: Insufficient documentation

## 2020-12-26 DIAGNOSIS — R531 Weakness: Secondary | ICD-10-CM | POA: Diagnosis not present

## 2020-12-26 DIAGNOSIS — Z20822 Contact with and (suspected) exposure to covid-19: Secondary | ICD-10-CM | POA: Diagnosis not present

## 2020-12-26 DIAGNOSIS — T40711A Poisoning by cannabis, accidental (unintentional), initial encounter: Secondary | ICD-10-CM | POA: Insufficient documentation

## 2020-12-26 DIAGNOSIS — I10 Essential (primary) hypertension: Secondary | ICD-10-CM | POA: Insufficient documentation

## 2020-12-26 DIAGNOSIS — R410 Disorientation, unspecified: Secondary | ICD-10-CM | POA: Diagnosis not present

## 2020-12-26 LAB — RESP PANEL BY RT-PCR (FLU A&B, COVID) ARPGX2
Influenza A by PCR: NEGATIVE
Influenza B by PCR: NEGATIVE
SARS Coronavirus 2 by RT PCR: NEGATIVE

## 2020-12-26 LAB — BASIC METABOLIC PANEL
Anion gap: 8 (ref 5–15)
BUN: 11 mg/dL (ref 6–20)
CO2: 25 mmol/L (ref 22–32)
Calcium: 9.4 mg/dL (ref 8.9–10.3)
Chloride: 106 mmol/L (ref 98–111)
Creatinine, Ser: 1.13 mg/dL — ABNORMAL HIGH (ref 0.44–1.00)
GFR, Estimated: 59 mL/min — ABNORMAL LOW (ref >60)
Glucose, Bld: 127 mg/dL — ABNORMAL HIGH (ref 70–99)
Potassium: 3.8 mmol/L (ref 3.5–5.1)
Sodium: 139 mmol/L (ref 135–145)

## 2020-12-26 LAB — CBG MONITORING, ED: Glucose-Capillary: 118 mg/dL — ABNORMAL HIGH (ref 70–99)

## 2020-12-26 LAB — CBC
HCT: 40.2 % (ref 36.0–46.0)
Hemoglobin: 12.3 g/dL (ref 12.0–15.0)
MCH: 26.5 pg (ref 26.0–34.0)
MCHC: 30.6 g/dL (ref 30.0–36.0)
MCV: 86.6 fL (ref 80.0–100.0)
Platelets: 252 10*3/uL (ref 150–400)
RBC: 4.64 MIL/uL (ref 3.87–5.11)
RDW: 14.3 % (ref 11.5–15.5)
WBC: 5 10*3/uL (ref 4.0–10.5)
nRBC: 0 % (ref 0.0–0.2)

## 2020-12-26 LAB — ETHANOL: Alcohol, Ethyl (B): <10 mg/dL (ref <10)

## 2020-12-26 LAB — I-STAT BETA HCG BLOOD, ED (MC, WL, AP ONLY): I-stat hCG, quantitative: <5 m[IU]/mL (ref <5)

## 2020-12-26 MED ORDER — IOHEXOL 350 MG/ML SOLN
75.0000 mL | Freq: Once | INTRAVENOUS | Status: AC | PRN
  Administered 2020-12-26: 75 mL via INTRAVENOUS

## 2020-12-26 MED ORDER — LACTATED RINGERS IV BOLUS
1000.0000 mL | Freq: Once | INTRAVENOUS | Status: AC
  Administered 2020-12-26: 1000 mL via INTRAVENOUS

## 2020-12-26 NOTE — ED Notes (Signed)
EDP at BS 

## 2020-12-26 NOTE — ED Triage Notes (Signed)
Pt here by [facility] from home s/p sz activity. Repoprts ate an "edible" at a party last night. Reports sz like activity at home. EMS witnessed ending of 2nd sz like activity upon there arrival. No meds given by EMS. Arrives postictal. C/o HA 8/10. No h/o sz. H/o anxiety and depression. Takes Buspar and robaxin.

## 2020-12-26 NOTE — ED Notes (Signed)
Report received from EMS per S.Darrick Grinder, RN

## 2020-12-26 NOTE — Discharge Instructions (Addendum)
Please refrain from driving until cleared by a neurologist.

## 2020-12-26 NOTE — ED Provider Notes (Signed)
[facility] EMERGENCY DEPARTMENT Provider Note   CSN: [id number] Arrival date & time: 12/26/20  1438     History No chief complaint on file.   Sarah Duncan is a 55 y.o. female.  HPI  55 year old female presenting to the emergency department after seizure-like activity earlier this morning.  The patient was reportedly last known normal at 5 AM per her significant other.  They both had marijuana edibles last night.  The patient significant other states that the marijuana caused her to have altered mental status and she presented overnight to the [facility] health emergency department but left without being seen due to the wait time.  The patient stayed up all night with her significant other while waiting in the waiting room with her.  This morning, the patient had to episodes of seizure-like activity.  Each episode consisted of generalized shaking, unresponsiveness, gaze deviation and resolved after around a minute.  The second episode was witnessed by EMS.  No medications were administered by EMS given resolution of symptoms.  The patient arrived to the emerged department postictal and confused.  She endorses a headache without neck stiffness, fever or chills.  The patient and her wife both mentioned that the marijuana Gummies caused him to have a sensation of numbness and weakness in their entire body.  Seizure-like activity noted above happened around 1300 today.  The patient arrived to [facility] health GCS 13, ABC intact.  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    Patient Active Problem List   Diagnosis Date Noted   Right leg pain 07/16/2019   S/P lumbar spinal fusion  12/10/2018   Degenerative disc disease, lumbar 12/10/2018    Class: Chronic   Spinal stenosis of lumbar region 12/10/2018    Class: Chronic   Surgery, elective    Generalized anxiety disorder 09/07/2017   Essential hypertension 07/31/2017   Other hyperlipidemia 03/06/2017   Class 2 obesity with serious comorbidity and body mass index (BMI) of 35.0 to 35.9 in adult 01/08/2017   Other chronic pain 12/04/2016   Prediabetes 10/18/2016   Vitamin D deficiency 10/18/2016   Depression 11/23/2015   Single kidney 10/12/2015   Chronic lower back pain 06/18/2015    Past Surgical History:  Procedure Laterality Date   ABDOMINAL HYSTERECTOMY  2007   CESAREAN SECTION  11/19/1989   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 Christia Reading   TOOTH EXTRACTION  04/16/2017   TUBAL LIGATION  05/09/1993     OB History     Gravida  4   Para  4   Term  4   Preterm      AB      Living  4      SAB      IAB      Ectopic      Multiple      Live Births              Family History  Problem Relation Age of Onset   Diabetes Mother  Hypertension Mother    Diabetes Brother    Hypertension Brother    Cancer Father    Endometriosis Daughter    Diabetes Daughter    HIV Son     Social History   Tobacco Use   Smoking status: Former    Years: 1.00    Types: Cigarettes   Smokeless tobacco: Never  Vaping Use   Vaping Use: Never used  Substance Use Topics   Alcohol use: Yes    Alcohol/week: 1.0 standard drink    Types: 1 Standard drinks or equivalent per week    Comment: social   Drug use: No    Home Medications Prior to Admission medications   Medication Sig Start Date End Date Taking? Authorizing Provider  busPIRone (BUSPAR) 15 MG tablet Take 15 mg by mouth 2 (two) times daily.  12/07/17   [provider]  diazepam (VALIUM) 5 MG tablet Take 1 by mouth 1 hour  pre-procedure with very light food. May bring 2nd  tablet to appointment. 05/22/19   Magnus Sinning, MD  diazepam (VALIUM) 5 MG tablet Take one tablet at the MRI and may repeat one time after 30 minutes if no effect. 06/11/19   Jessy Oto, MD  DULoxetine (CYMBALTA) 60 MG capsule Take 1 capsule (60 mg total) by mouth daily. Patient taking differently: Take 60 mg by mouth at bedtime.  09/07/17   Harrison Mons, PA  HYDROcodone-acetaminophen (NORCO/VICODIN) 5-325 MG tablet Take 1 tablet by mouth every 6 (six) hours as needed for moderate pain. 06/11/19   Jessy Oto, MD  Melatonin 10 MG TABS Take 20 mg by mouth at bedtime as needed (sleep).     [provider]  Multiple Vitamin (MULTIVITAMIN WITH MINERALS) TABS tablet Take 2 tablets by mouth daily.     [provider]  polyethylene glycol (MIRALAX / GLYCOLAX) 17 g packet Take 17 g by mouth daily as needed for mild constipation. 12/13/18   Jessy Oto, MD  tiZANidine (ZANAFLEX) 4 MG tablet Take 0.5-1 tablets (2-4 mg total) by mouth every 6 (six) hours as needed for muscle spasms. Not more than 3 times daily. Patient taking differently: Take 4 mg by mouth 2 (two) times a day.  10/08/17   Harrison Mons, PA  traMADol (ULTRAM) 50 MG tablet Take 2 tablets (100 mg total) by mouth every 6 (six) hours as needed. 04/24/19   Jessy Oto, MD    Allergies    Patient has no known allergies.  Review of Systems   Review of Systems  Unable to perform ROS: Mental status change   Physical Exam Updated Vital Signs BP (!) 133/92   Pulse 75   Temp 97.9 F (36.6 C)   Resp 20   Ht '5\' 7"'$  (1.702 m)   Wt 101.6 kg   SpO2 98%   BMI 35.08 kg/m   Physical Exam Vitals and nursing note reviewed.  Constitutional:      General: She is not in acute distress.    Appearance: She is well-developed.     Comments: GCS 13, ABC intact  HENT:     Head: Normocephalic and atraumatic.  Eyes:     Conjunctiva/sclera: Conjunctivae normal.  Cardiovascular:     Rate and Rhythm: Normal rate and  regular rhythm.     Heart sounds: No murmur heard. Pulmonary:     Effort: Pulmonary effort is normal. No respiratory distress.     Breath sounds: Normal breath sounds.  Abdominal:  Palpations: Abdomen is soft.     Tenderness: There is no abdominal tenderness.  Musculoskeletal:     Cervical back: Neck supple.  Skin:    General: Skin is warm and dry.  Neurological:     Mental Status: She is alert.     Comments: Patient able to mouth words, no clear cranial nerve deficit beyond slightly slurred speech.  Moving all 4 extremities and withdraws to painful stimuli    ED Results / Procedures / Treatments   Labs (all labs ordered are listed, but only abnormal results are displayed) Labs Reviewed  BASIC METABOLIC PANEL - Abnormal; Notable for the following components:      Result Value   Glucose, Bld 127 (*)    Creatinine, Ser 1.13 (*)    GFR, Estimated 59 (*)    All other components within normal limits  CBG MONITORING, ED - Abnormal; Notable for the following components:   Glucose-Capillary 118 (*)    All other components within normal limits  RESP PANEL BY RT-PCR (FLU A&B, COVID) ARPGX2  CBC  ETHANOL  I-STAT BETA HCG BLOOD, ED ([facility], [facility], [facility] ONLY)    EKG None  Radiology CT HEAD WO CONTRAST (5MM)  Result Date: 12/26/2020 CLINICAL DATA:  Seizure, nontraumatic (Age >= 41y) EXAM: CT HEAD WITHOUT CONTRAST TECHNIQUE: Contiguous axial images were obtained from the base of the skull through the vertex without intravenous contrast. COMPARISON:  None. FINDINGS: Brain: No acute intracranial abnormality. Specifically, no hemorrhage, hydrocephalus, mass lesion, acute infarction, or significant intracranial injury. Vascular: No hyperdense vessel or unexpected calcification. Skull: No acute calvarial abnormality. Sinuses/Orbits: No acute findings Other: None IMPRESSION: Normal study. Electronically Signed   By: Rolm Baptise M.D.   On: 12/26/2020 16:58   CT ANGIO HEAD CODE STROKE  Result Date:  12/26/2020 CLINICAL DATA:  Stroke/TIA, assess extracranial arteries; Stroke/TIA, assess intracranial arteries EXAM: CT ANGIOGRAPHY HEAD AND NECK TECHNIQUE: Multidetector CT imaging of the head and neck was performed using the standard protocol during bolus administration of intravenous contrast. Multiplanar CT image reconstructions and MIPs were obtained to evaluate the vascular anatomy. Carotid stenosis measurements (when applicable) are obtained utilizing NASCET criteria, using the distal internal carotid diameter as the denominator. CONTRAST:  6m OMNIPAQUE IOHEXOL 350 MG/ML SOLN COMPARISON:  None. FINDINGS: CTA NECK FINDINGS SKELETON: There is no bony spinal canal stenosis. No lytic or blastic lesion. OTHER NECK: Normal pharynx, larynx and major salivary glands. No cervical lymphadenopathy. Unremarkable thyroid gland. UPPER CHEST: No pneumothorax or pleural effusion. No nodules or masses. AORTIC ARCH: There is no calcific atherosclerosis of the aortic arch. There is no aneurysm, dissection or hemodynamically significant stenosis of the visualized portion of the aorta. Conventional 3 vessel aortic branching pattern. The visualized proximal subclavian arteries are widely patent. RIGHT CAROTID SYSTEM: Normal without aneurysm, dissection or stenosis. LEFT CAROTID SYSTEM: Normal without aneurysm, dissection or stenosis. VERTEBRAL ARTERIES: Left dominant configuration. Both origins are clearly patent. There is no dissection, occlusion or flow-limiting stenosis to the skull base (V1-V3 segments). CTA HEAD FINDINGS POSTERIOR CIRCULATION: --Vertebral arteries: Normal V4 segments. --Inferior cerebellar arteries: Normal. --Basilar artery: Normal. --Superior cerebellar arteries: Normal. --Posterior cerebral arteries (PCA): Normal. ANTERIOR CIRCULATION: --Intracranial internal carotid arteries: Normal. --Anterior cerebral arteries (ACA): Normal. Both A1 segments are present. Patent anterior communicating artery (a-comm).  --Middle cerebral arteries (MCA): Normal. VENOUS SINUSES: As permitted by contrast timing, patent. ANATOMIC VARIANTS: None Review of the MIP images confirms the above findings. IMPRESSION: Normal CTA of the head and neck. Electronically  Signed   By: Ulyses Jarred M.D.   On: 12/26/2020 21:26   CT ANGIO NECK CODE STROKE  Result Date: 12/26/2020 CLINICAL DATA:  Stroke/TIA, assess extracranial arteries; Stroke/TIA, assess intracranial arteries EXAM: CT ANGIOGRAPHY HEAD AND NECK TECHNIQUE: Multidetector CT imaging of the head and neck was performed using the standard protocol during bolus administration of intravenous contrast. Multiplanar CT image reconstructions and MIPs were obtained to evaluate the vascular anatomy. Carotid stenosis measurements (when applicable) are obtained utilizing NASCET criteria, using the distal internal carotid diameter as the denominator. CONTRAST:  16m OMNIPAQUE IOHEXOL 350 MG/ML SOLN COMPARISON:  None. FINDINGS: CTA NECK FINDINGS SKELETON: There is no bony spinal canal stenosis. No lytic or blastic lesion. OTHER NECK: Normal pharynx, larynx and major salivary glands. No cervical lymphadenopathy. Unremarkable thyroid gland. UPPER CHEST: No pneumothorax or pleural effusion. No nodules or masses. AORTIC ARCH: There is no calcific atherosclerosis of the aortic arch. There is no aneurysm, dissection or hemodynamically significant stenosis of the visualized portion of the aorta. Conventional 3 vessel aortic branching pattern. The visualized proximal subclavian arteries are widely patent. RIGHT CAROTID SYSTEM: Normal without aneurysm, dissection or stenosis. LEFT CAROTID SYSTEM: Normal without aneurysm, dissection or stenosis. VERTEBRAL ARTERIES: Left dominant configuration. Both origins are clearly patent. There is no dissection, occlusion or flow-limiting stenosis to the skull base (V1-V3 segments). CTA HEAD FINDINGS POSTERIOR CIRCULATION: --Vertebral arteries: Normal V4 segments.  --Inferior cerebellar arteries: Normal. --Basilar artery: Normal. --Superior cerebellar arteries: Normal. --Posterior cerebral arteries (PCA): Normal. ANTERIOR CIRCULATION: --Intracranial internal carotid arteries: Normal. --Anterior cerebral arteries (ACA): Normal. Both A1 segments are present. Patent anterior communicating artery (a-comm). --Middle cerebral arteries (MCA): Normal. VENOUS SINUSES: As permitted by contrast timing, patent. ANATOMIC VARIANTS: None Review of the MIP images confirms the above findings. IMPRESSION: Normal CTA of the head and neck. Electronically Signed   By: KUlyses JarredM.D.   On: 12/26/2020 21:26    Procedures Procedures   Medications Ordered in ED Medications  lactated ringers bolus 1,000 mL (0 mLs Intravenous Stopped 12/26/20 1739)  iohexol (OMNIPAQUE) 350 MG/ML injection 75 mL (75 mLs Intravenous Contrast Given 12/26/20 2053)    ED Course  I have reviewed the triage vital signs and the nursing notes.  Pertinent labs & imaging results that were available during my care of the patient were reviewed by me and considered in my medical decision making (see chart for details).    MDM Rules/Calculators/A&P                           55year old female presenting with altered mental status and seizure-like activity after consumption of marijuana Gummies and lack of sleep.  The patient had 2 episodes of generalized jerking and unresponsiveness that lasted 30 seconds and 60 seconds each with subsequent resolution without intervention.  She has no history of prior seizure disorders and this is a first-time seizure for her.  She arrives to the emergency department GCS 13, appears intoxicated, postictal with somnolence noted and difficulty formulating words.    Differential diagnosis includes marijuana intoxication, seizure and postictal state, electrolyte abnormality, other toxic metabolic derangement, other drug intoxication, less likely CVA.  Altered mental status  work-up initiated in the emergency department given the patient's lack of return to full baseline mental status.  Neurology consulted in the emergency department.  CT head performed without acute intracranial abnormality.  Neurology recommended CTA code stroke imaging work-up which was initiated and ultimately resulted negative.  Labs performed to include COVID-19 and influenza PCR which resulted negative.  Blood glucose normal in the emergency department, beta hCG negative, ethanol level normal, BMP without electrolyte abnormality, renal function at baseline.  CBC with a leukocytosis to 14.7, stable anemia at 9.8.  The patient has no fevers, neck stiffness or other meningeal findings.  Low concern for meningitis or encephalitis.  Her mental status slowly improved throughout the course of her 7-hour emergency department stay.  She on reassessment was found to be formulating words, alert and oriented x3, ambulatory in the emergency department.  She did have a prolonged postictal state which may be due to her cannabis ingestion.  Neurology did recommend EEG in the a.m. or if she would like to go home early, outpatient EEG.  I discussed admission with the hospitalist team and given the patient's improvement 3 hours following neurology's evaluation, in discussion with the patient and family, the decision was made for the patient to continue to recover at home with a plan for outpatient neurology follow-up.  The patient was strongly advised no driving until cleared by a neurologist.  The patient and family were comfortable with the plan for discharge rather than hospital admission and I feel that this is appropriate at this time given her significant clinical improvement.    Final Clinical Impression(s) / ED Diagnoses Final diagnoses:  Seizure-like activity ([place])  Disorientation  Purposeful non-suicidal drug ingestion, initial encounter [facility])  Accidental cannabis overdose, initial encounter    Rx / DC  Orders ED Discharge Orders     None        Regan Lemming, MD 12/27/20 1419

## 2020-12-26 NOTE — Consult Note (Signed)
NEUROLOGY CONSULTATION NOTE   Date of service: December 26, 2020 Patient Name: Sarah Duncan MRN:  [id number] DOB:  1965-09-01 Reason for consult: "Seizure" Requesting Provider: Regan Lemming, MD _ _ _   _ __   _ __ _ _  __ __   _ __   __ _  History of Present Illness  Sarah Duncan is a 55 y.o. female with PMH significant for anxiety, depression, constipation, fibroids, lactose intolerance and a single kidney who presents with 2 episodes of seizure-like activity at home during her sleep.  Patient reports that she took 2 edible marijuana Gummies at a party last night.  Her wife who also took the same gummies had a weird sensation of numbness and weakness in her entire body after taking the gummies and they were in the emergency department waiting room and stayed there until 5 AM. Symptoms resolved so they went back home. Patient went to bed and at 1300 today, she was noted by family to have 30 seconds of arm and leg shaking episode in her sleep.  Her significant other woke her up and she was oriented to self. A few minutes later, she had a 60 sec episode of seizure like activity. She was brought in to the ED. here she is postictal and seems to be waking up more.  ED team had concerns about patient not being able to communicate well and not being back to her baseline and so we were asked to assess the patient.  On my evaluation, she is somnolent, her voice is whispery but when asked to speak loudly, she is able to.  She denies any alcohol history, no prior CNS infection, no CNS surgery, no history of strokes or ICH.  No family history of epilepsy or seizures.  Denies using any recreational substances apart from marijuana occasionally.  She is not sure if the Gummies she took yesterday were laced with something else.   ROS   Constitutional Denies weight loss, fever and chills.   HEENT Denies changes in vision and hearing.   Respiratory Denies SOB and cough.   CV Denies  palpitations and CP   GI Denies abdominal pain, nausea, vomiting and diarrhea.   GU Denies dysuria and urinary frequency.   MSK Denies myalgia and joint pain.   Skin Denies rash and pruritus.   Neurological endorses headache but no syncope.   Psychiatric Denies recent changes in mood. Denies anxiety and depression.    Past History   Past Medical History:  Diagnosis Date  . Anemia   . Anxiety   . Arthritis   . Constipation   . Degeneration of cervical intervertebral disc   . Depression   . Fibroids   . 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 Lactose intolerance   . Palpitations   . Shortness of breath   . 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 ABDOMINAL HYSTERECTOMY  2007  . CESAREAN SECTION  11/19/1989  . 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 SPINE SURGERY  07/13/2015   Lumbar - Dr. Christia Reading  . TOOTH EXTRACTION  04/16/2017  . TUBAL LIGATION  05/09/1993   Family History  Problem Relation Age of Onset  . Diabetes Mother   . Hypertension Mother   . Diabetes Brother   . Hypertension Brother   . Cancer Father   . Endometriosis Daughter   .  Diabetes Daughter   . HIV Son    Social History   Socioeconomic History  . Marital status: Married    Spouse name: Ronni Rumble  . Number of children: 4  . Years of education: associates  . Highest education level: Not on file  Occupational History  . 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 Smoking status: Former    Years: 1.00    Types: Cigarettes  . Smokeless tobacco: Never  Vaping Use  . Vaping Use: Never used  Substance and Sexual Activity  . Alcohol use: Yes    Alcohol/week: 1.0 standard drink    Types: 1 Standard drinks or equivalent per week    Comment: social  . Drug use: No  . Sexual activity: Yes    Partners: Female  Other Topics Concern  . Not on file   Social History Narrative   Divorced. 4 adult children (3 in [place], one in [place]).   Re-Married 05/2015. Lives with her wife.   Her wife's children live locally.   Social Determinants of Health   Financial Resource Strain: Not on file  Food Insecurity: Not on file  Transportation Needs: Not on file  Physical Activity: Not on file  Stress: Not on file  Social Connections: Not on file   No Known Allergies  Medications  (Not in a hospital admission)    Vitals   Vitals:   12/26/20 1815 12/26/20 1830 12/26/20 1900 12/26/20 1915  BP: (!) 138/94 140/89 120/72 129/83  Pulse: 85 86 86 78  Resp: '16 18 20 17  '$ Temp:      SpO2: 100% 99% 96% 98%  Weight:      Height:         Body mass index is 35.08 kg/m.  Physical Exam   General: Laying comfortably in bed; in no acute distress.  HENT: Normal oropharynx and mucosa. Normal external appearance of ears and nose.  Neck: Supple, no pain or tenderness  CV: No JVD. No peripheral edema.  Pulmonary: Symmetric Chest rise. Normal respiratory effort.  Abdomen: Soft to touch, non-tender.  Ext: No cyanosis, edema, or deformity  Skin: No rash. Normal palpation of skin.   Musculoskeletal: Normal digits and nails by inspection. No clubbing.   Neurologic Examination  Mental status/Cognition: Alert, oriented to self, place, month and year, good attention.  Speech/language: Fluent, comprehension intact, object naming intact, repetition intact.  Cranial nerves:   CN II Pupils equal and reactive to light, no VF deficits    CN III,IV,VI EOM intact, no gaze preference or deviation, no nystagmus    CN V normal sensation in V1, V2, and V3 segments bilaterally    CN VII no asymmetry, no nasolabial fold flattening    CN VIII normal hearing to speech    CN IX & X normal palatal elevation, no uvular deviation    CN XI 5/5 head turn and 5/5 shoulder shrug bilaterally    CN XII midline tongue protrusion    Motor:  Muscle bulk: normal, tone  normal, pronator drift none tremor none Mvmt Root Nerve  Muscle Right Left Comments  SA C5/6 Ax Deltoid 5 5   EF C5/6 Mc Biceps 5 5   EE C6/7/8 Rad Triceps 5 5   WF C6/7 Med FCR     WE C7/8 PIN ECU     F Ab C8/T1 U ADM/FDI 5 5   HF L1/2/3 Fem Illopsoas 4 4  KE L2/3/4 Fem Quad 5 5   DF L4/5 D Peron Tib Ant 5 5   PF S1/2 Tibial Grc/Sol 5 5    Reflexes:  Right Left Comments  Pectoralis      Biceps (C5/6) 2 2   Brachioradialis (C5/6) 2 2    Triceps (C6/7) 2 2    Patellar (L3/4) 2 2    Achilles (S1)      Hoffman      Plantar     Jaw jerk    Sensation:  Light touch intact   Pin prick    Temperature    Vibration   Proprioception    Coordination/Complex Motor:  - Finger to Nose intact - Heel to shin intact - Rapid alternating movement slow throughout - Gait: Deferred.  Labs   CBC:  Recent Labs  Lab 12/26/20 1520  WBC 5.0  HGB 12.3  HCT 40.2  MCV 86.6  PLT [id number]    Basic Metabolic Panel:  Lab Results  Component Value Date   NA 139 12/26/2020   K 3.8 12/26/2020   CO2 25 12/26/2020   GLUCOSE 127 (H) 12/26/2020   BUN 11 12/26/2020   CREATININE 1.13 (H) 12/26/2020   CALCIUM 9.4 12/26/2020   GFRNONAA 59 (L) 12/26/2020   GFRAA >60 12/12/2018   Lipid Panel:  Lab Results  Component Value Date   LDLCALC 103 (H) 09/07/2017   HgbA1c:  Lab Results  Component Value Date   HGBA1C 6.1 (H) 09/07/2017   Urine Drug Screen: No results found for: LABOPIA, COCAINSCRNUR, LABBENZ, AMPHETMU, THCU, LABBARB  Alcohol Level     Component Value Date/Time   ETH <10 12/26/2020 1520    CT Head without contrast: Personally reviewd and CTH was negative for a large hypodensity concerning for a large territory infarct or hyperdensity concerning for an ICH  rEEG:  pending  Impression   Sarah Duncan is a 55 y.o. female with PMH significant for anxiety, depression, constipation, fibroids, lactose intolerance and a single kidney who took "marijuana gummies" last  night and had 2 episodes of seizure-like activity at home during her sleep. Prolonged post ictal and now waking up. ED team had some concern for trouble wird findings, I do not think that she has aphasia out of proportion to her encephalopathy. Her neurologic examination is notable for no focal deficit.  I think it is reasonable to observe her overnight and see if she has any further seizures specially since we have not been able to get a urine sample on her yet and unknown what she actually ingested with the gummies and much prolonged post ictal state than I would expect for her seizures.  Recommendations  - Can get a rEEG in AM or if she would like to go home earliy AM, can do it outpatient in 1 week - No driving for 6 months. I discussed with patient and her wife and daughter at bedside. ______________________________________________________________________   Thank you for the opportunity to take part in the care of this patient. If you have any further questions, please contact the neurology consultation attending.  Signed,  [place] Pager Number [phone] _ _ _   _ __   _ __ _ _  __ __   _ __   __ _

## 2020-12-27 ENCOUNTER — Encounter (HOSPITAL_BASED_OUTPATIENT_CLINIC_OR_DEPARTMENT_OTHER): Payer: Self-pay

## 2020-12-27 ENCOUNTER — Other Ambulatory Visit: Payer: Self-pay

## 2020-12-27 ENCOUNTER — Observation Stay (HOSPITAL_BASED_OUTPATIENT_CLINIC_OR_DEPARTMENT_OTHER)
Admission: EM | Admit: 2020-12-27 | Discharge: 2020-12-29 | Disposition: A | Discharge status: Home / Self Care | Payer: Medicare (Managed Care) | Attending: Internal Medicine | Admitting: Internal Medicine

## 2020-12-27 DIAGNOSIS — Z79899 Other long term (current) drug therapy: Secondary | ICD-10-CM | POA: Insufficient documentation

## 2020-12-27 DIAGNOSIS — R569 Unspecified convulsions: Secondary | ICD-10-CM | POA: Diagnosis present

## 2020-12-27 DIAGNOSIS — I10 Essential (primary) hypertension: Secondary | ICD-10-CM | POA: Insufficient documentation

## 2020-12-27 DIAGNOSIS — R4701 Aphasia: Secondary | ICD-10-CM | POA: Diagnosis not present

## 2020-12-27 DIAGNOSIS — Z87891 Personal history of nicotine dependence: Secondary | ICD-10-CM | POA: Insufficient documentation

## 2020-12-27 DIAGNOSIS — Y900 Blood alcohol level of less than 20 mg/100 ml: Secondary | ICD-10-CM | POA: Diagnosis not present

## 2020-12-27 DIAGNOSIS — Z20822 Contact with and (suspected) exposure to covid-19: Secondary | ICD-10-CM | POA: Diagnosis not present

## 2020-12-27 LAB — CBC WITH DIFFERENTIAL/PLATELET
Abs Immature Granulocytes: 0.02 10*3/uL (ref 0.00–0.07)
Basophils Absolute: 0 10*3/uL (ref 0.0–0.1)
Basophils Relative: 1 %
Eosinophils Absolute: 0.2 10*3/uL (ref 0.0–0.5)
Eosinophils Relative: 4 %
HCT: 39.7 % (ref 36.0–46.0)
Hemoglobin: 12.7 g/dL (ref 12.0–15.0)
Immature Granulocytes: 0 %
Lymphocytes Relative: 45 %
Lymphs Abs: 2.4 10*3/uL (ref 0.7–4.0)
MCH: 27.3 pg (ref 26.0–34.0)
MCHC: 32 g/dL (ref 30.0–36.0)
MCV: 85.2 fL (ref 80.0–100.0)
Monocytes Absolute: 0.8 10*3/uL (ref 0.1–1.0)
Monocytes Relative: 14 %
Neutro Abs: 2 10*3/uL (ref 1.7–7.7)
Neutrophils Relative %: 36 %
Platelets: 253 10*3/uL (ref 150–400)
RBC: 4.66 MIL/uL (ref 3.87–5.11)
RDW: 14.6 % (ref 11.5–15.5)
WBC: 5.4 10*3/uL (ref 4.0–10.5)
nRBC: 0 % (ref 0.0–0.2)

## 2020-12-27 LAB — COMPREHENSIVE METABOLIC PANEL
ALT: 22 U/L (ref 0–44)
AST: 26 U/L (ref 15–41)
Albumin: 3.8 g/dL (ref 3.5–5.0)
Alkaline Phosphatase: 151 U/L — ABNORMAL HIGH (ref 38–126)
Anion gap: 9 (ref 5–15)
BUN: 12 mg/dL (ref 6–20)
CO2: 28 mmol/L (ref 22–32)
Calcium: 9.4 mg/dL (ref 8.9–10.3)
Chloride: 103 mmol/L (ref 98–111)
Creatinine, Ser: 0.96 mg/dL (ref 0.44–1.00)
GFR, Estimated: >60 mL/min (ref >60)
Glucose, Bld: 85 mg/dL (ref 70–99)
Potassium: 3.5 mmol/L (ref 3.5–5.1)
Sodium: 140 mmol/L (ref 135–145)
Total Bilirubin: 0.3 mg/dL (ref 0.3–1.2)
Total Protein: 7.4 g/dL (ref 6.5–8.1)

## 2020-12-27 LAB — RAPID URINE DRUG SCREEN, HOSP PERFORMED
Amphetamines: NOT DETECTED
Barbiturates: NOT DETECTED
Benzodiazepines: NOT DETECTED
Cocaine: NOT DETECTED
Opiates: NOT DETECTED
Tetrahydrocannabinol: POSITIVE — AB

## 2020-12-27 LAB — RESP PANEL BY RT-PCR (FLU A&B, COVID) ARPGX2
Influenza A by PCR: NEGATIVE
Influenza B by PCR: NEGATIVE
SARS Coronavirus 2 by RT PCR: NEGATIVE

## 2020-12-27 LAB — ETHANOL: Alcohol, Ethyl (B): <10 mg/dL (ref <10)

## 2020-12-27 MED ORDER — SODIUM CHLORIDE 0.9 % IV BOLUS
1000.0000 mL | Freq: Once | INTRAVENOUS | Status: AC
  Administered 2020-12-27: 1000 mL via INTRAVENOUS

## 2020-12-27 NOTE — ED Notes (Signed)
Pt ambulatory to bathroom without difficulty.  

## 2020-12-27 NOTE — ED Triage Notes (Signed)
"  We were at hospital yesterday for accidental overdose from gummy edibles and had seizures, they checked her out yesterday and discharged her. Today she is still kind of disoriented and not herself and has been tearful" per wife.   "Headache, and I don't feel like myself" per pt Alert and oriented x 5

## 2020-12-27 NOTE — ED Notes (Signed)
Snacks provided ok'd by PA

## 2020-12-27 NOTE — Plan of Care (Signed)
Discussed briefly w/ Dr. Darl Householder, patient seen yesterday by Neurology, as a code stroke for c/f seizure and prolonged post-ictal state. She was discharged prior to completing EEG and head CT was negative for acute intracranial process.   She has continued to have slow clearing of her encephalopathy, not worsening per EDP, but not back to baseline with intermittent episodes of not responding (without eye deviation compared to first episode did demonstrate eye deviation). No observed events in ED, her exam remains non-focal for Dr. Darl Householder, with no concern for ongoing seizure activity. Labs notable for UDS + for THC, CBC and CMP reassuring with only mild Alk Phos elevation (151), not checked recently prior to today.   Continue to suspect clearing toxic-metabolic state but MRI brain w/o contrast in addition to routine EEG is reasonable to exclude acute intracranial process.  Please consult neurology again formally if these studies are positive.   Sarah Noe MD-PhD [facility] [phone]  Available 7 PM to 7 AM, outside of these hours please call Neurologist on call as listed on Amion.

## 2020-12-27 NOTE — ED Provider Notes (Signed)
[facility] Provider Note   CSN: [id number] Arrival date & time: 12/27/20  1816     History Chief Complaint  Patient presents with   Seizures    Sarah Duncan is a 55 y.o. female with history of anemia, anxiety, hypertension.  Presents to the emergency department with a chief complaint of "feeling not herself."    Per chart review patient presented to the emergency department yesterday after seizure-like activity and presented with aphasia.  Code stroke was called and patient was evaluated by neurology.  CTA of head and neck were unremarkable.  EEG was planned for following morning or to be performed at  close neurology follow-up.  Patient's wife at bedside states that today patient has been "staring off into space and mouthing things."  Patient will respond and answer during these episodes.  Patient was also reports that patient has been "delayed" in responses.  Reports that patient's behavior has not gone back to baseline after event that brought her to emergency department yesterday.  Patient endorses headache.  States that headache has been constant since yesterday.  Headache is located throughout her entire head.  Patient denies any aggravating factors.  Patient had no relief with Tylenol.  Patient endorses bilateral blurry vision.    Patient denies any seizures, numbness, weakness, facial asymmetry, slurred speech, neck pain, back pain, SI, HI, AVH.  Patient denies any drug or alcohol use today.  No recent falls or injuries.   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    Patient Active Problem List   Diagnosis Date Noted    Right leg pain 07/16/2019   S/P lumbar spinal fusion 12/10/2018   Degenerative disc disease, lumbar 12/10/2018    Class: Chronic   Spinal stenosis of lumbar region 12/10/2018    Class: Chronic   Surgery, elective    Generalized anxiety disorder 09/07/2017   Essential hypertension 07/31/2017   Other hyperlipidemia 03/06/2017   Class 2 obesity with serious comorbidity and body mass index (BMI) of 35.0 to 35.9 in adult 01/08/2017   Other chronic pain 12/04/2016   Prediabetes 10/18/2016   Vitamin D deficiency 10/18/2016   Depression 11/23/2015   Single kidney 10/12/2015   Chronic lower back pain 06/18/2015    Past Surgical History:  Procedure Laterality Date   ABDOMINAL HYSTERECTOMY  2007   CESAREAN SECTION  11/19/1989   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 Christia Reading   TOOTH EXTRACTION  04/16/2017   TUBAL LIGATION  05/09/1993     OB History     Gravida  4   Para  4   Term  4   Preterm      AB      Living  4      SAB      IAB      Ectopic      Multiple      Live Births              Family History  Problem Relation Age of Onset   Diabetes Mother    Hypertension Mother    Diabetes Brother    Hypertension Brother    Cancer Father    Endometriosis Daughter    Diabetes Daughter    HIV Son     Social History   Tobacco Use   Smoking status: Former    Years: 1.00    Types: Cigarettes   Smokeless tobacco: Never  Vaping Use   Vaping Use: Never used  Substance Use Topics   Alcohol use: Yes    Alcohol/week: 1.0 standard drink    Types: 1 Standard drinks or equivalent per week    Comment: social   Drug use: No    Home Medications Prior to Admission medications   Medication Sig Start Date End Date Taking? Authorizing Provider  busPIRone (BUSPAR) 15 MG tablet Take 15 mg by mouth 2 (two) times daily.  12/07/17   [provider]  diazepam (VALIUM) 5 MG tablet Take 1 by mouth 1  hour  pre-procedure with very light food. May bring 2nd tablet to appointment. 05/22/19   Magnus Sinning, MD  diazepam (VALIUM) 5 MG tablet Take one tablet at the MRI and may repeat one time after 30 minutes if no effect. 06/11/19   Jessy Oto, MD  DULoxetine (CYMBALTA) 60 MG capsule Take 1 capsule (60 mg total) by mouth daily. Patient taking differently: Take 60 mg by mouth at bedtime.  09/07/17   Harrison Mons, PA  HYDROcodone-acetaminophen (NORCO/VICODIN) 5-325 MG tablet Take 1 tablet by mouth every 6 (six) hours as needed for moderate pain. 06/11/19   Jessy Oto, MD  Melatonin 10 MG TABS Take 20 mg by mouth at bedtime as needed (sleep).     [provider]  Multiple Vitamin (MULTIVITAMIN WITH MINERALS) TABS tablet Take 2 tablets by mouth daily.     [provider]  polyethylene glycol (MIRALAX / GLYCOLAX) 17 g packet Take 17 g by mouth daily as needed for mild constipation. 12/13/18   Jessy Oto, MD  tiZANidine (ZANAFLEX) 4 MG tablet Take 0.5-1 tablets (2-4 mg total) by mouth every 6 (six) hours as needed for muscle spasms. Not more than 3 times daily. Patient taking differently: Take 4 mg by mouth 2 (two) times a day.  10/08/17   Harrison Mons, PA  traMADol (ULTRAM) 50 MG tablet Take 2 tablets (100 mg total) by mouth every 6 (six) hours as needed. 04/24/19   Jessy Oto, MD    Allergies    Patient has no known allergies.  Review of Systems   Review of Systems  Constitutional:  Negative for chills and fever.  Eyes:  Negative for visual disturbance.  Respiratory:  Negative for shortness of breath.   Cardiovascular:  Negative for chest pain.  Gastrointestinal:  Negative for abdominal pain, nausea and vomiting.  Genitourinary:  Negative for difficulty urinating.  Musculoskeletal:  Negative for back pain, neck pain and neck stiffness.  Skin:  Negative for color change and rash.  Neurological:  Positive for headaches. Negative for dizziness, tremors,  seizures, syncope, facial asymmetry, speech difficulty, weakness, light-headedness and numbness.  Psychiatric/Behavioral:  Negative for confusion.    Physical Exam Updated Vital Signs BP 134/83   Pulse 79   Temp 98.2 F (36.8 C) (Oral)   Resp (!) 21   Ht '5\' 7"'$  (1.702 m)   Wt 102.1 kg   SpO2 98%   BMI 35.24 kg/m   Physical Exam Vitals and nursing  note reviewed.  Constitutional:      General: She is not in acute distress.    Appearance: She is not ill-appearing, toxic-appearing or diaphoretic.  Eyes:     General: No scleral icterus.       Right eye: No discharge.        Left eye: No discharge.     Extraocular Movements: Extraocular movements intact.     Pupils: Pupils are equal, round, and reactive to light.  Cardiovascular:     Rate and Rhythm: Normal rate.  Pulmonary:     Effort: Pulmonary effort is normal. No bradypnea or respiratory distress.     Breath sounds: Normal breath sounds. No stridor.  Abdominal:     Palpations: Abdomen is soft.     Tenderness: There is no abdominal tenderness.  Musculoskeletal:     Cervical back: Normal range of motion and neck supple. No edema, erythema, signs of trauma, rigidity, torticollis or crepitus. No pain with movement, spinous process tenderness or muscular tenderness. Normal range of motion.     Comments: No midline tenderness or deformity to cervical, thoracic, or lumbar spine  Skin:    General: Skin is warm and dry.  Neurological:     General: No focal deficit present.     Mental Status: She is alert.     GCS: GCS eye subscore is 4. GCS verbal subscore is 5. GCS motor subscore is 6.     Cranial Nerves: No cranial nerve deficit or facial asymmetry.     Sensory: Sensation is intact.     Motor: No weakness, tremor, seizure activity or pronator drift.     Coordination: Romberg sign positive. Finger-Nose-Finger Test normal.     Gait: Gait is intact. Gait normal.     Comments: CN II-XII intact, equal grip strength, +5 strength to  bilateral upper and lower extremities, sensation to light touch intact to bilateral upper and lower extremities.  Psychiatric:        Behavior: Behavior is cooperative.    ED Results / Procedures / Treatments   Labs (all labs ordered are listed, but only abnormal results are displayed) Labs Reviewed  COMPREHENSIVE METABOLIC PANEL - Abnormal; Notable for the following components:      Result Value   Alkaline Phosphatase 151 (*)    All other components within normal limits  RAPID URINE DRUG SCREEN, HOSP PERFORMED - Abnormal; Notable for the following components:   Tetrahydrocannabinol POSITIVE (*)    All other components within normal limits  RESP PANEL BY RT-PCR (FLU A&B, COVID) ARPGX2  CBC WITH DIFFERENTIAL/PLATELET  ETHANOL    EKG EKG Interpretation  Date/Time:  Monday December 27 2020 20:39:06 EDT Ventricular Rate:  70 PR Interval:  162 QRS Duration: 84 QT Interval:  427 QTC Calculation: 461 R Axis:   43 Text Interpretation: Sinus rhythm Baseline wander in lead(s) V4 No significant change since last tracing Confirmed by Wandra Arthurs [phone]) on 12/27/2020 8:41:54 PM  Radiology CT HEAD WO CONTRAST (5MM)  Result Date: 12/26/2020 CLINICAL DATA:  Seizure, nontraumatic (Age >= 41y) EXAM: CT HEAD WITHOUT CONTRAST TECHNIQUE: Contiguous axial images were obtained from the base of the skull through the vertex without intravenous contrast. COMPARISON:  None. FINDINGS: Brain: No acute intracranial abnormality. Specifically, no hemorrhage, hydrocephalus, mass lesion, acute infarction, or significant intracranial injury. Vascular: No hyperdense vessel or unexpected calcification. Skull: No acute calvarial abnormality. Sinuses/Orbits: No acute findings Other: None IMPRESSION: Normal study. Electronically Signed   By: Rolm Baptise M.D.  On: 12/26/2020 16:58   CT ANGIO HEAD CODE STROKE  Result Date: 12/26/2020 CLINICAL DATA:  Stroke/TIA, assess extracranial arteries; Stroke/TIA, assess  intracranial arteries EXAM: CT ANGIOGRAPHY HEAD AND NECK TECHNIQUE: Multidetector CT imaging of the head and neck was performed using the standard protocol during bolus administration of intravenous contrast. Multiplanar CT image reconstructions and MIPs were obtained to evaluate the vascular anatomy. Carotid stenosis measurements (when applicable) are obtained utilizing NASCET criteria, using the distal internal carotid diameter as the denominator. CONTRAST:  87m OMNIPAQUE IOHEXOL 350 MG/ML SOLN COMPARISON:  None. FINDINGS: CTA NECK FINDINGS SKELETON: There is no bony spinal canal stenosis. No lytic or blastic lesion. OTHER NECK: Normal pharynx, larynx and major salivary glands. No cervical lymphadenopathy. Unremarkable thyroid gland. UPPER CHEST: No pneumothorax or pleural effusion. No nodules or masses. AORTIC ARCH: There is no calcific atherosclerosis of the aortic arch. There is no aneurysm, dissection or hemodynamically significant stenosis of the visualized portion of the aorta. Conventional 3 vessel aortic branching pattern. The visualized proximal subclavian arteries are widely patent. RIGHT CAROTID SYSTEM: Normal without aneurysm, dissection or stenosis. LEFT CAROTID SYSTEM: Normal without aneurysm, dissection or stenosis. VERTEBRAL ARTERIES: Left dominant configuration. Both origins are clearly patent. There is no dissection, occlusion or flow-limiting stenosis to the skull base (V1-V3 segments). CTA HEAD FINDINGS POSTERIOR CIRCULATION: --Vertebral arteries: Normal V4 segments. --Inferior cerebellar arteries: Normal. --Basilar artery: Normal. --Superior cerebellar arteries: Normal. --Posterior cerebral arteries (PCA): Normal. ANTERIOR CIRCULATION: --Intracranial internal carotid arteries: Normal. --Anterior cerebral arteries (ACA): Normal. Both A1 segments are present. Patent anterior communicating artery (a-comm). --Middle cerebral arteries (MCA): Normal. VENOUS SINUSES: As permitted by contrast timing,  patent. ANATOMIC VARIANTS: None Review of the MIP images confirms the above findings. IMPRESSION: Normal CTA of the head and neck. Electronically Signed   By: KUlyses JarredM.D.   On: 12/26/2020 21:26   CT ANGIO NECK CODE STROKE  Result Date: 12/26/2020 CLINICAL DATA:  Stroke/TIA, assess extracranial arteries; Stroke/TIA, assess intracranial arteries EXAM: CT ANGIOGRAPHY HEAD AND NECK TECHNIQUE: Multidetector CT imaging of the head and neck was performed using the standard protocol during bolus administration of intravenous contrast. Multiplanar CT image reconstructions and MIPs were obtained to evaluate the vascular anatomy. Carotid stenosis measurements (when applicable) are obtained utilizing NASCET criteria, using the distal internal carotid diameter as the denominator. CONTRAST:  739mOMNIPAQUE IOHEXOL 350 MG/ML SOLN COMPARISON:  None. FINDINGS: CTA NECK FINDINGS SKELETON: There is no bony spinal canal stenosis. No lytic or blastic lesion. OTHER NECK: Normal pharynx, larynx and major salivary glands. No cervical lymphadenopathy. Unremarkable thyroid gland. UPPER CHEST: No pneumothorax or pleural effusion. No nodules or masses. AORTIC ARCH: There is no calcific atherosclerosis of the aortic arch. There is no aneurysm, dissection or hemodynamically significant stenosis of the visualized portion of the aorta. Conventional 3 vessel aortic branching pattern. The visualized proximal subclavian arteries are widely patent. RIGHT CAROTID SYSTEM: Normal without aneurysm, dissection or stenosis. LEFT CAROTID SYSTEM: Normal without aneurysm, dissection or stenosis. VERTEBRAL ARTERIES: Left dominant configuration. Both origins are clearly patent. There is no dissection, occlusion or flow-limiting stenosis to the skull base (V1-V3 segments). CTA HEAD FINDINGS POSTERIOR CIRCULATION: --Vertebral arteries: Normal V4 segments. --Inferior cerebellar arteries: Normal. --Basilar artery: Normal. --Superior cerebellar arteries:  Normal. --Posterior cerebral arteries (PCA): Normal. ANTERIOR CIRCULATION: --Intracranial internal carotid arteries: Normal. --Anterior cerebral arteries (ACA): Normal. Both A1 segments are present. Patent anterior communicating artery (a-comm). --Middle cerebral arteries (MCA): Normal. VENOUS SINUSES: As permitted by contrast timing, patent. ANATOMIC VARIANTS:  None Review of the MIP images confirms the above findings. IMPRESSION: Normal CTA of the head and neck. Electronically Signed   By: Ulyses Jarred M.D.   On: 12/26/2020 21:26    Procedures Procedures   Medications Ordered in ED Medications - No data to display  ED Course  I have reviewed the triage vital signs and the nursing notes.  Pertinent labs & imaging results that were available during my care of the patient were reviewed by me and considered in my medical decision making (see chart for details).  Clinical Course as of 12/28/20 0221  Mon Dec 27, 2020  2234 Spoke to Dr. Sidney Ace who will admit the patient for transfer. [PB]    Clinical Course User Index [PB] Dyann Ruddle   MDM Rules/Calculators/A&P                           Alert 55 year old female in no acute distress, nontoxic appearing.  Presents to the emergency department with a chief complaint of "feeling not herself."  Per chart review patient presented to the emergency department yesterday after seizure-like activity and presented with aphasia.  Code stroke was called and patient was evaluated by neurology.  CTA of head and neck were unremarkable.  EEG was planned for following morning or to be performed at  close neurology follow-up.  Patient's behavior has not returned to baseline after event that brought her to the emergency department yesterday.  Patient has had episodes of "staring off in space and mouthing things."  Patient is alert to person, place, and time.  No neurological deficits noted on exam.  Patient is alert, responsive all questions, and  follows all commands.  Will obtain UDS, CMP, ethanol, CBC, and EKG. UDS positive for marijuana Ethanol less than 10 CMP unremarkable CBC unremarkable EKG shows sinus rhythm with no significant change from previous tracing  Patient was discussed with and evaluated by Dr. Darl Householder.  Due to patient's continued change behavior we will consult neurology and hospitalist team for admission to received EEG.  Dr. Darl Householder spoke with neurologist Dr. Curly Shores.   Spoke to [facility] who agreed to accept the patient for admission.   Final Clinical Impression(s) / ED Diagnoses Final diagnoses:  None    Rx / DC Orders ED Discharge Orders     None        Loni Beckwith, PA-C 12/28/20 0230    Drenda Freeze, MD 12/30/20 [phone]

## 2020-12-28 ENCOUNTER — Observation Stay (HOSPITAL_COMMUNITY): Payer: Medicare (Managed Care)

## 2020-12-28 DIAGNOSIS — R569 Unspecified convulsions: Secondary | ICD-10-CM

## 2020-12-28 DIAGNOSIS — I10 Essential (primary) hypertension: Secondary | ICD-10-CM | POA: Diagnosis not present

## 2020-12-28 DIAGNOSIS — G9340 Encephalopathy, unspecified: Secondary | ICD-10-CM | POA: Diagnosis not present

## 2020-12-28 DIAGNOSIS — Z79899 Other long term (current) drug therapy: Secondary | ICD-10-CM | POA: Diagnosis not present

## 2020-12-28 DIAGNOSIS — Z87891 Personal history of nicotine dependence: Secondary | ICD-10-CM | POA: Diagnosis not present

## 2020-12-28 DIAGNOSIS — Z20822 Contact with and (suspected) exposure to covid-19: Secondary | ICD-10-CM | POA: Diagnosis not present

## 2020-12-28 DIAGNOSIS — Y900 Blood alcohol level of less than 20 mg/100 ml: Secondary | ICD-10-CM | POA: Diagnosis not present

## 2020-12-28 DIAGNOSIS — R4701 Aphasia: Secondary | ICD-10-CM | POA: Diagnosis not present

## 2020-12-28 LAB — CBC
HCT: 40.5 % (ref 36.0–46.0)
Hemoglobin: 12.7 g/dL (ref 12.0–15.0)
MCH: 26.7 pg (ref 26.0–34.0)
MCHC: 31.4 g/dL (ref 30.0–36.0)
MCV: 85.3 fL (ref 80.0–100.0)
Platelets: 267 10*3/uL (ref 150–400)
RBC: 4.75 MIL/uL (ref 3.87–5.11)
RDW: 14.2 % (ref 11.5–15.5)
WBC: 5.8 10*3/uL (ref 4.0–10.5)
nRBC: 0 % (ref 0.0–0.2)

## 2020-12-28 LAB — HIV ANTIBODY (ROUTINE TESTING W REFLEX): HIV Screen 4th Generation wRfx: NONREACTIVE

## 2020-12-28 LAB — CREATININE, SERUM
Creatinine, Ser: 1.05 mg/dL — ABNORMAL HIGH (ref 0.44–1.00)
GFR, Estimated: >60 mL/min (ref >60)

## 2020-12-28 MED ORDER — BUSPIRONE HCL 10 MG PO TABS
15.0000 mg | ORAL_TABLET | Freq: Two times a day (BID) | ORAL | Status: DC
  Administered 2020-12-28 – 2020-12-29 (×2): 15 mg via ORAL
  Filled 2020-12-28 (×2): qty 2

## 2020-12-28 MED ORDER — DULOXETINE HCL 60 MG PO CPEP
60.0000 mg | ORAL_CAPSULE | Freq: Every day | ORAL | Status: DC
  Administered 2020-12-28: 60 mg via ORAL
  Filled 2020-12-28: qty 1

## 2020-12-28 MED ORDER — MORPHINE SULFATE (PF) 2 MG/ML IV SOLN
2.0000 mg | INTRAVENOUS | Status: DC | PRN
Start: 2020-12-28 — End: 2020-12-29
  Administered 2020-12-28 – 2020-12-29 (×2): 2 mg via INTRAVENOUS
  Filled 2020-12-28 (×2): qty 1

## 2020-12-28 MED ORDER — ENOXAPARIN SODIUM 40 MG/0.4ML IJ SOSY
40.0000 mg | PREFILLED_SYRINGE | INTRAMUSCULAR | Status: DC
  Administered 2020-12-28: 40 mg via SUBCUTANEOUS
  Filled 2020-12-28: qty 0.4

## 2020-12-28 MED ORDER — ACETAMINOPHEN 325 MG PO TABS
ORAL_TABLET | ORAL | Status: AC
  Administered 2020-12-28: 650 mg
  Filled 2020-12-28: qty 2

## 2020-12-28 MED ORDER — TIZANIDINE HCL 4 MG PO TABS
2.0000 mg | ORAL_TABLET | Freq: Four times a day (QID) | ORAL | Status: DC | PRN
  Administered 2020-12-28: 2 mg via ORAL
  Filled 2020-12-28: qty 1

## 2020-12-28 NOTE — H&P (Signed)
History and Physical    Sarah Duncan [id number] DOB: June 07, 1965 DOA: 12/27/2020  Referring MD/NP/PA: EDP PCP:  Patient coming from: Home  Chief Complaint: Not acting right  HPI: Sarah Duncan is a 55 year old female with history of osteoarthritis, anxiety, depression, DJD, fibroids, solitary kidney, was seen in [facility] ED on Sunday 8/2 after 2 generalized tonic-clonic seizures while sleeping, as witnessed by her wife.  History of consuming 2 THC Gummies the night before at a party, partner also consumed the same and reportedly was groggy and slept for 6 hours thereafter. -Mental status improved while in the emergency room, was a little slow but answering questions appropriately, work-up was unremarkable including labs and CTA head and neck, subsequently discharged home. -On Monday 8/8 she was groggy all day, extremely slightly slow to respond, foggy, noted to be mumbling and staring off into space at times, brought to [place] ED, labs again were unremarkable, case was discussed with neurology, recommended MRI and EEG, transferred from [place] ED to [facility] today.  She denies any changes in her medications, takes Cymbalta Zanaflex and BuSpar which has been unchanged for years, no fevers or chills, only illicit use was THC Gummies on Saturday night  Review of Systems: As per HPI otherwise 14 point review of systems negative.   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 Christia Reading   TOOTH EXTRACTION  04/16/2017   TUBAL LIGATION  05/09/1993     reports that she has quit smoking. Her smoking use included cigarettes. She has never used smokeless tobacco. She reports current alcohol use of about 1.0 standard drink of alcohol per week. She reports that she does not use drugs.  No Known Allergies  Family History  Problem Relation Age of Onset   Diabetes Mother    Hypertension Mother    Diabetes Brother    Hypertension Brother    Cancer Father    Endometriosis Daughter    Diabetes Daughter    HIV Son      Prior to Admission medications   Medication Sig Start Date End Date Taking? Authorizing Provider  busPIRone (BUSPAR) 15 MG tablet Take 15 mg by mouth 2 (two) times daily.  12/07/17  Yes [provider]  DULoxetine (CYMBALTA) 60 MG capsule Take 1 capsule (60 mg total) by mouth daily. Patient taking differently: Take 60 mg by mouth at bedtime. 09/07/17  Yes Harrison Mons, PA  Melatonin 10 MG TABS Take 20 mg by mouth daily as needed (sleep).   Yes [provider]  tiZANidine (ZANAFLEX) 4 MG tablet Take 0.5-1 tablets (2-4 mg total) by mouth every 6 (six) hours as needed for muscle spasms. Not more than 3 times daily. Patient taking differently: Take 4 mg by mouth at bedtime. 10/08/17  Yes Jeffery, Domingo Mend, PA  baclofen (LIORESAL) 10 MG tablet  Take 10 mg by mouth 3 (three) times daily as needed for pain. Patient not taking: Reported on 12/28/2020 12/05/20   [provider]  diazepam (VALIUM) 5 MG tablet Take 1 by mouth 1 hour  pre-procedure with very light food. May bring 2nd tablet to appointment. Patient not taking: Reported on 12/28/2020 05/22/19   Magnus Sinning, MD  diazepam (VALIUM) 5 MG tablet Take one tablet at the MRI and may repeat one time after 30 minutes if no effect. Patient not taking: Reported on 12/28/2020 06/11/19   Jessy Oto, MD  HYDROcodone-acetaminophen (NORCO/VICODIN) 5-325 MG tablet Take  1 tablet by mouth every 6 (six) hours as needed for moderate pain. Patient not taking: Reported on 12/28/2020 06/11/19   Jessy Oto, MD  Multiple Vitamin (MULTIVITAMIN WITH MINERALS) TABS tablet Take 2 tablets by mouth daily.  Patient not taking: Reported on 12/28/2020    [provider]  polyethylene glycol (MIRALAX / GLYCOLAX) 17 g packet Take 17 g by mouth daily as needed for mild constipation. Patient not taking: Reported on 12/28/2020 12/13/18   Jessy Oto, MD  traMADol (ULTRAM) 50 MG tablet Take 2 tablets (100 mg total) by mouth every 6 (six) hours as needed. Patient not taking: Reported on 12/28/2020 04/24/19   Jessy Oto, MD    Physical Exam: Vitals:   12/28/20 0900 12/28/20 1000 12/28/20 1244 12/28/20 1500  BP: 137/90 (!) 138/91 (!) 143/84 137/87  Pulse: 75 82 67 63  Resp: (!) 24 (!) '23 18 18  '$ Temp:    97.9 F (36.6 C)  TempSrc:    Oral  SpO2: 97% 96% 100% 99%  Weight:      Height:        Gen: AAOx3, no distress HEENT: No JVD, extraocular movements intact CVS: S1-S2, regular rate rhythm Lungs: Clear bilaterally Abdomen: Soft, nontender, bowel sounds present Extremities: No edema Skin: No rash on exposed skin Neuro: Cranial nerves are intact, gross motor strength is normal in upper and lower extremities, sensation to light touch intact, DTR 2+ Psych: Appropriate mood and affect  Labs on Admission: I have personally reviewed following labs and imaging studies  CBC: Recent Labs  Lab 12/26/20 1520 12/27/20 2036  WBC 5.0 5.4  NEUTROABS  --  2.0  HGB 12.3 12.7  HCT 40.2 39.7  MCV 86.6 85.2  PLT 252 [id number]   Basic Metabolic Panel: Recent Labs  Lab 12/26/20 1520 12/27/20 2036  NA 139 140  K 3.8 3.5  CL 106 103  CO2 25 28  GLUCOSE 127* 85  BUN 11 12  CREATININE 1.13* 0.96  CALCIUM 9.4 9.4   GFR: Estimated Creatinine Clearance: 84.2 mL/min (by C-G formula based on SCr of 0.96 mg/dL). Liver Function Tests: Recent Labs  Lab 12/27/20 2036  AST 26   ALT 22  ALKPHOS 151*  BILITOT 0.3  PROT 7.4  ALBUMIN 3.8   No results for input(s): LIPASE, AMYLASE in the last 168 hours. No results for input(s): AMMONIA in the last 168 hours. Coagulation Profile: No results for input(s): INR, PROTIME in the last 168 hours. Cardiac Enzymes: No results for input(s): CKTOTAL, CKMB, CKMBINDEX, TROPONINI in the last 168 hours. BNP (last 3 results) No results for input(s): PROBNP in the last 8760 hours. HbA1C: No results for input(s): HGBA1C in the last 72 hours. CBG: Recent Labs  Lab 12/26/20 1612  GLUCAP 118*   Lipid Profile: No results for input(s): CHOL, HDL, LDLCALC, TRIG, CHOLHDL, LDLDIRECT in the last  72 hours. Thyroid Function Tests: No results for input(s): TSH, T4TOTAL, FREET4, T3FREE, THYROIDAB in the last 72 hours. Anemia Panel: No results for input(s): VITAMINB12, FOLATE, FERRITIN, TIBC, IRON, RETICCTPCT in the last 72 hours. Urine analysis:    Component Value Date/Time   COLORURINE YELLOW 12/09/2018 1515   APPEARANCEUR CLEAR 12/09/2018 1515   LABSPEC 1.020 12/09/2018 1515   PHURINE 5.0 12/09/2018 1515   GLUCOSEU NEGATIVE 12/09/2018 1515   HGBUR NEGATIVE 12/09/2018 1515   BILIRUBINUR NEGATIVE 12/09/2018 1515   BILIRUBINUR negative 10/12/2015 [place] 12/09/2018 1515   PROTEINUR NEGATIVE 12/09/2018 1515   UROBILINOGEN 0.2 10/12/2015 1614   NITRITE NEGATIVE 12/09/2018 1515   LEUKOCYTESUR NEGATIVE 12/09/2018 1515   Sepsis Labs: '@LABRCNTIP'$ (procalcitonin:4,lacticidven:4) ) Recent Results (from the past 240 hour(s))  Resp Panel by RT-PCR (Flu A&B, Covid) Nasopharyngeal Swab     Status: None   Collection Time: 12/26/20  7:15 PM   Specimen: Nasopharyngeal Swab; Nasopharyngeal(NP) swabs in vial transport medium  Result Value Ref Range Status   SARS Coronavirus 2 by RT PCR NEGATIVE NEGATIVE Final    Comment: (NOTE) SARS-CoV-2 target nucleic acids are NOT DETECTED.  The SARS-CoV-2 RNA is generally detectable  in upper respiratory specimens during the acute phase of infection. The lowest concentration of SARS-CoV-2 viral copies this assay can detect is 138 copies/mL. A negative result does not preclude SARS-Cov-2 infection and should not be used as the sole basis for treatment or other patient management decisions. A negative result may occur with  improper specimen collection/handling, submission of specimen other than nasopharyngeal swab, presence of viral mutation(s) within the areas targeted by this assay, and inadequate number of viral copies(<138 copies/mL). A negative result must be combined with clinical observations, patient history, and epidemiological information. The expected result is Negative.  Fact Sheet for Patients:  EntrepreneurPulse.com.au  Fact Sheet for Healthcare Providers:  IncredibleEmployment.be  This test is no t yet approved or cleared by the [place] FDA and  has been authorized for detection and/or diagnosis of SARS-CoV-2 by FDA under an Emergency Use Authorization (EUA). This EUA will remain  in effect (meaning this test can be used) for the duration of the COVID-19 declaration under Section 564(b)(1) of the Act, 21 U.S.C.section 360bbb-3(b)(1), unless the authorization is terminated  or revoked sooner.       Influenza A by PCR NEGATIVE NEGATIVE Final   Influenza B by PCR NEGATIVE NEGATIVE Final    Comment: (NOTE) The Xpert Xpress SARS-CoV-2/FLU/RSV plus assay is intended as an aid in the diagnosis of influenza from Nasopharyngeal swab specimens and should not be used as a sole basis for treatment. Nasal washings and aspirates are unacceptable for Xpert Xpress SARS-CoV-2/FLU/RSV testing.  Fact Sheet for Patients: EntrepreneurPulse.com.au  Fact Sheet for Healthcare Providers: IncredibleEmployment.be  This test is not yet approved or cleared by the [place] FDA and has  been authorized for detection and/or diagnosis of SARS-CoV-2 by FDA under an Emergency Use Authorization (EUA). This EUA will remain in effect (meaning this test can be used) for the duration of the COVID-19 declaration under Section 564(b)(1) of the Act, 21 U.S.C. section 360bbb-3(b)(1), unless the authorization is terminated or revoked.  Performed at [facility], [place] [address], [place], [place] [address]   Resp Panel by RT-PCR (Flu A&B, Covid) Urine, Clean Catch     Status: None   Collection Time: 12/27/20  8:25 PM   Specimen: Urine, Clean Catch; Nasopharyngeal(NP) swabs in vial transport medium  Result Value Ref Range Status   SARS Coronavirus 2 by RT PCR NEGATIVE NEGATIVE Final    Comment: (NOTE) SARS-CoV-2 target nucleic acids are NOT DETECTED.  The SARS-CoV-2 RNA is generally detectable in upper respiratory specimens during the acute phase of infection. The lowest concentration of SARS-CoV-2 viral copies this assay can detect is 138 copies/mL. A negative result does not preclude SARS-Cov-2 infection and should not be used as the sole basis for treatment or other patient management decisions. A negative result may occur with  improper specimen collection/handling, submission of specimen other than nasopharyngeal swab, presence of viral mutation(s) within the areas targeted by this assay, and inadequate number of viral copies(<138 copies/mL). A negative result must be combined with clinical observations, patient history, and epidemiological information. The expected result is Negative.  Fact Sheet for Patients:  EntrepreneurPulse.com.au  Fact Sheet for Healthcare Providers:  IncredibleEmployment.be  This test is no t yet approved or cleared by the [place] FDA and  has been authorized for detection and/or diagnosis of SARS-CoV-2 by FDA under an Emergency Use Authorization (EUA). This EUA will remain  in effect (meaning this  test can be used) for the duration of the COVID-19 declaration under Section 564(b)(1) of the Act, 21 U.S.C.section 360bbb-3(b)(1), unless the authorization is terminated  or revoked sooner.       Influenza A by PCR NEGATIVE NEGATIVE Final   Influenza B by PCR NEGATIVE NEGATIVE Final    Comment: (NOTE) The Xpert Xpress SARS-CoV-2/FLU/RSV plus assay is intended as an aid in the diagnosis of influenza from Nasopharyngeal swab specimens and should not be used as a sole basis for treatment. Nasal washings and aspirates are unacceptable for Xpert Xpress SARS-CoV-2/FLU/RSV testing.  Fact Sheet for Patients: EntrepreneurPulse.com.au  Fact Sheet for Healthcare Providers: IncredibleEmployment.be  This test is not yet approved or cleared by the [place] FDA and has been authorized for detection and/or diagnosis of SARS-CoV-2 by FDA under an Emergency Use Authorization (EUA). This EUA will remain in effect (meaning this test can be used) for the duration of the COVID-19 declaration under Section 564(b)(1) of the Act, 21 U.S.C. section 360bbb-3(b)(1), unless the authorization is terminated or revoked.  Performed at [facility], [address] [place], [place] [address]      Radiological Exams on Admission: CT HEAD WO CONTRAST (5MM)  Result Date: 12/26/2020 CLINICAL DATA:  Seizure, nontraumatic (Age >= 69y) EXAM: CT HEAD WITHOUT CONTRAST TECHNIQUE: Contiguous axial images were obtained from the base of the skull through the vertex without intravenous contrast. COMPARISON:  None. FINDINGS: Brain: No acute intracranial abnormality. Specifically, no hemorrhage, hydrocephalus, mass lesion, acute infarction, or significant intracranial injury. Vascular: No hyperdense vessel or unexpected calcification. Skull: No acute calvarial abnormality. Sinuses/Orbits: No acute findings Other: None IMPRESSION: Normal study. Electronically Signed   By: Rolm Baptise M.D.   On: 12/26/2020 16:58   CT ANGIO HEAD CODE STROKE  Result Date: 12/26/2020 CLINICAL DATA:  Stroke/TIA, assess extracranial arteries; Stroke/TIA, assess intracranial arteries EXAM: CT ANGIOGRAPHY HEAD AND NECK TECHNIQUE: Multidetector CT imaging of the head and neck was performed using the standard protocol during bolus administration of intravenous contrast. Multiplanar CT image reconstructions and MIPs were obtained to evaluate the vascular anatomy. Carotid stenosis measurements (when applicable) are obtained utilizing NASCET criteria, using the distal internal carotid diameter as the denominator. CONTRAST:  53m OMNIPAQUE IOHEXOL 350 MG/ML SOLN COMPARISON:  None. FINDINGS: CTA NECK FINDINGS SKELETON: There is no bony spinal canal stenosis.  No lytic or blastic lesion. OTHER NECK: Normal pharynx, larynx and major salivary glands. No cervical lymphadenopathy. Unremarkable thyroid gland. UPPER CHEST: No pneumothorax or pleural effusion. No nodules or masses. AORTIC ARCH: There is no calcific atherosclerosis of the aortic arch. There is no aneurysm, dissection or hemodynamically significant stenosis of the visualized portion of the aorta. Conventional 3 vessel aortic branching pattern. The visualized proximal subclavian arteries are widely patent. RIGHT CAROTID SYSTEM: Normal without aneurysm, dissection or stenosis. LEFT CAROTID SYSTEM: Normal without aneurysm, dissection or stenosis. VERTEBRAL ARTERIES: Left dominant configuration. Both origins are clearly patent. There is no dissection, occlusion or flow-limiting stenosis to the skull base (V1-V3 segments). CTA HEAD FINDINGS POSTERIOR CIRCULATION: --Vertebral arteries: Normal V4 segments. --Inferior cerebellar arteries: Normal. --Basilar artery: Normal. --Superior cerebellar arteries: Normal. --Posterior cerebral arteries (PCA): Normal. ANTERIOR CIRCULATION: --Intracranial internal carotid arteries: Normal. --Anterior cerebral arteries (ACA): Normal.  Both A1 segments are present. Patent anterior communicating artery (a-comm). --Middle cerebral arteries (MCA): Normal. VENOUS SINUSES: As permitted by contrast timing, patent. ANATOMIC VARIANTS: None Review of the MIP images confirms the above findings. IMPRESSION: Normal CTA of the head and neck. Electronically Signed   By: Ulyses Jarred M.D.   On: 12/26/2020 21:26   CT ANGIO NECK CODE STROKE  Result Date: 12/26/2020 CLINICAL DATA:  Stroke/TIA, assess extracranial arteries; Stroke/TIA, assess intracranial arteries EXAM: CT ANGIOGRAPHY HEAD AND NECK TECHNIQUE: Multidetector CT imaging of the head and neck was performed using the standard protocol during bolus administration of intravenous contrast. Multiplanar CT image reconstructions and MIPs were obtained to evaluate the vascular anatomy. Carotid stenosis measurements (when applicable) are obtained utilizing NASCET criteria, using the distal internal carotid diameter as the denominator. CONTRAST:  43m OMNIPAQUE IOHEXOL 350 MG/ML SOLN COMPARISON:  None. FINDINGS: CTA NECK FINDINGS SKELETON: There is no bony spinal canal stenosis. No lytic or blastic lesion. OTHER NECK: Normal pharynx, larynx and major salivary glands. No cervical lymphadenopathy. Unremarkable thyroid gland. UPPER CHEST: No pneumothorax or pleural effusion. No nodules or masses. AORTIC ARCH: There is no calcific atherosclerosis of the aortic arch. There is no aneurysm, dissection or hemodynamically significant stenosis of the visualized portion of the aorta. Conventional 3 vessel aortic branching pattern. The visualized proximal subclavian arteries are widely patent. RIGHT CAROTID SYSTEM: Normal without aneurysm, dissection or stenosis. LEFT CAROTID SYSTEM: Normal without aneurysm, dissection or stenosis. VERTEBRAL ARTERIES: Left dominant configuration. Both origins are clearly patent. There is no dissection, occlusion or flow-limiting stenosis to the skull base (V1-V3 segments). CTA HEAD  FINDINGS POSTERIOR CIRCULATION: --Vertebral arteries: Normal V4 segments. --Inferior cerebellar arteries: Normal. --Basilar artery: Normal. --Superior cerebellar arteries: Normal. --Posterior cerebral arteries (PCA): Normal. ANTERIOR CIRCULATION: --Intracranial internal carotid arteries: Normal. --Anterior cerebral arteries (ACA): Normal. Both A1 segments are present. Patent anterior communicating artery (a-comm). --Middle cerebral arteries (MCA): Normal. VENOUS SINUSES: As permitted by contrast timing, patent. ANATOMIC VARIANTS: None Review of the MIP images confirms the above findings. IMPRESSION: Normal CTA of the head and neck. Electronically Signed   By: KUlyses JarredM.D.   On: 12/26/2020 21:26    EKG: Independently reviewed.   Assessment/Plan  Encephalopathy -Suspect postictal state -Now improving, almost back to baseline, will check EEG and MRI brain -PT OT eval  Recent seizures on 8/7 -Likely secondary to [place] may have been [place] -CTA head and neck, labs, unremarkable  DJD -Continue Zanaflex  Anxiety, depression -Continue BuSpar and Cymbalta  DVT prophylaxis: Lovenox Code Status: Full code Family Communication: Wife at bedside Disposition Plan:  Home tomorrow if stable Admission status: Observation  Domenic Polite MD [facility]   12/28/2020, 4:21 PM

## 2020-12-28 NOTE — Plan of Care (Signed)
  Problem: Education: Goal: Expressions of having a comfortable level of knowledge regarding the disease process will increase Outcome: Progressing   Problem: Coping: Goal: Ability to adjust to condition or change in health will improve Outcome: Progressing Goal: Ability to identify appropriate support needs will improve Outcome: Progressing   Problem: Health Behavior/Discharge Planning: Goal: Compliance with prescribed medication regimen will improve Outcome: Progressing   Problem: Medication: Goal: Risk for medication side effects will decrease Outcome: Progressing   Problem: Clinical Measurements: Goal: Complications related to the disease process, condition or treatment will be avoided or minimized Outcome: Progressing Goal: Diagnostic test results will improve Outcome: Progressing   Problem: Safety: Goal: Verbalization of understanding the information provided will improve Outcome: Progressing   Problem: Self-Concept: Goal: Level of anxiety will decrease Outcome: Progressing

## 2020-12-28 NOTE — Plan of Care (Signed)

## 2020-12-28 NOTE — ED Notes (Signed)
Ambulatory to bathroom to void. Gait steady. No signs of distress.

## 2020-12-28 NOTE — ED Notes (Signed)
Pt ambulatory to restroom, tolerated well. Pt given snacks and drink per request.

## 2020-12-29 ENCOUNTER — Observation Stay (HOSPITAL_COMMUNITY): Payer: Medicare (Managed Care)

## 2020-12-29 DIAGNOSIS — G9340 Encephalopathy, unspecified: Secondary | ICD-10-CM | POA: Diagnosis not present

## 2020-12-29 DIAGNOSIS — R569 Unspecified convulsions: Secondary | ICD-10-CM | POA: Diagnosis not present

## 2020-12-29 LAB — COMPREHENSIVE METABOLIC PANEL
ALT: 19 U/L (ref 0–44)
AST: 25 U/L (ref 15–41)
Albumin: 3.2 g/dL — ABNORMAL LOW (ref 3.5–5.0)
Alkaline Phosphatase: 134 U/L — ABNORMAL HIGH (ref 38–126)
Anion gap: 8 (ref 5–15)
BUN: 9 mg/dL (ref 6–20)
CO2: 29 mmol/L (ref 22–32)
Calcium: 9.6 mg/dL (ref 8.9–10.3)
Chloride: 102 mmol/L (ref 98–111)
Creatinine, Ser: 1.09 mg/dL — ABNORMAL HIGH (ref 0.44–1.00)
GFR, Estimated: >60 mL/min (ref >60)
Glucose, Bld: 159 mg/dL — ABNORMAL HIGH (ref 70–99)
Potassium: 3.7 mmol/L (ref 3.5–5.1)
Sodium: 139 mmol/L (ref 135–145)
Total Bilirubin: 0.4 mg/dL (ref 0.3–1.2)
Total Protein: 6.8 g/dL (ref 6.5–8.1)

## 2020-12-29 LAB — CBC
HCT: 36.7 % (ref 36.0–46.0)
Hemoglobin: 11.5 g/dL — ABNORMAL LOW (ref 12.0–15.0)
MCH: 26.6 pg (ref 26.0–34.0)
MCHC: 31.3 g/dL (ref 30.0–36.0)
MCV: 85 fL (ref 80.0–100.0)
Platelets: 258 10*3/uL (ref 150–400)
RBC: 4.32 MIL/uL (ref 3.87–5.11)
RDW: 14.2 % (ref 11.5–15.5)
WBC: 5.7 10*3/uL (ref 4.0–10.5)
nRBC: 0 % (ref 0.0–0.2)

## 2020-12-29 MED ORDER — ACETAMINOPHEN 325 MG PO TABS
650.0000 mg | ORAL_TABLET | Freq: Four times a day (QID) | ORAL | Status: DC | PRN
  Administered 2020-12-29: 650 mg via ORAL
  Filled 2020-12-29: qty 2

## 2020-12-29 NOTE — Procedures (Signed)
Patient Name: LAGUITA BROMBERG  MRN: [id number]  Epilepsy Attending: Lora Havens  Referring Physician/Provider: Dr Domenic Polite Date: 12/29/2020 Duration: 23.33 mins  Patient history: 55 year old female with history of seizures who presented with altered mental status.  EEG to evaluate for seizures.  Level of alertness: Awake, drowsy  AEDs during EEG study: None  Technical aspects: This EEG study was done with scalp electrodes positioned according to the 10-20 International system of electrode placement. Electrical activity was acquired at a sampling rate of '500Hz'$  and reviewed with a high frequency filter of '70Hz'$  and a low frequency filter of '1Hz'$ . EEG data were recorded continuously and digitally stored.   Description: The posterior dominant rhythm consists of 8-'9Hz'$  activity of moderate voltage (25-35 uV) seen predominantly in posterior head regions, symmetric and reactive to eye opening and eye closing. Drowsiness was characterized by attenuation of the posterior background rhythm. Hyperventilation and photic stimulation were not performed.     IMPRESSION: This study is within normal limits. No seizures or epileptiform discharges were seen throughout the recording.  Zamyia Gowell Barbra Sarks

## 2020-12-29 NOTE — TOC Transition Note (Signed)
Transition of Care [facility]) - CM/SW Discharge Note   Patient Details  Name: Sarah Duncan MRN: [id number] Date of Birth: 1965/05/27  [facility] CM/SW Contact:  Pollie Friar, RN Phone Number: 12/29/2020, 1:15 PM   Clinical Narrative:    Pt discharging home with self care. No needs per PT.  Home DME: scooter/ walker Pt denies issues with home medications. Pt has transportation home.   Final next level of care: Home/Self Care Barriers to Discharge: No Barriers Identified   Patient Goals and CMS Choice        Discharge Placement                       Discharge Plan and Services                                     Social Determinants of Health (SDOH) Interventions     Readmission Risk Interventions No flowsheet data found.

## 2020-12-29 NOTE — Plan of Care (Signed)

## 2020-12-29 NOTE — Evaluation (Signed)
Physical Therapy Evaluation & Discharge Patient Details Name: Sarah Duncan MRN: [id number] DOB: 1966/01/30 Today's Date: 12/29/2020   History of Present Illness  55 y/o female presented to [place] ED initially on 8/7 following seizure activity after taking "edible" where she was observed and d/c'ed. Returned to ED on 8/8 with chief complaint of "feeling not herself", headache, and bilateral blurry vision. Initial CT head were unremarkable on 8/7. Transferred to [facility] for MRI and EEG. MRI negative. EEG pending. PMH: osteoarthritis, anxiety, depression, fibroids, solitary kidney.  Clinical Impression  PTA, patient lives with wife and reports independence with mobility but wife assists with bathing/dressing during episodes of increased back pain. Patient currently functioning at independent level for mobility with no AD. Patient with reports of 9/10 pain in head and monitored throughout session. No further skilled PT needs required acutely. No PT follow up recommended at this time.     Follow Up Recommendations No PT follow up    Equipment Recommendations  None recommended by PT    Recommendations for Other Services       Precautions / Restrictions Precautions Precautions: None Precaution Comments: seizure Restrictions Weight Bearing Restrictions: No      Mobility  Bed Mobility Overal bed mobility: Independent                  Transfers Overall transfer level: Independent Equipment used: None                Ambulation/Gait Ambulation/Gait assistance: Independent Gait Distance (Feet): 200 Feet Assistive device: None Gait Pattern/deviations: WFL(Within Functional Limits)   Gait velocity interpretation: >2.62 ft/sec, indicative of community ambulatory    Stairs Stairs: Yes Stairs assistance: Independent Stair Management: No rails;Alternating pattern;Forwards Number of Stairs: 3    Wheelchair Mobility    Modified Rankin (Stroke Patients  Only)       Balance Overall balance assessment: No apparent balance deficits (not formally assessed)                                           Pertinent Vitals/Pain Pain Assessment: 0-10 Pain Score: 9  Pain Location: head Pain Descriptors / Indicators: Headache Pain Intervention(s): Monitored during session    Home Living Family/patient expects to be discharged to:: Private residence Living Arrangements: Spouse/significant other Available Help at Discharge: Family;Available PRN/intermittently Type of Home: House Home Access: Stairs to enter   Entrance Stairs-Number of Steps: 1 Home Layout: One level Home Equipment: Mardy Hoppe - 2 wheels;Bedside commode;Electric scooter      Prior Function Level of Independence: Needs assistance   Gait / Transfers Assistance Needed: ambulates without AD. Uses scooter for community distances.  ADL's / Homemaking Assistance Needed: helps with bathing and dressing during episodes of increased back pain  Comments: not working, on disability     Hand Dominance        Extremity/Trunk Assessment   Upper Extremity Assessment Upper Extremity Assessment: Overall WFL for tasks assessed    Lower Extremity Assessment Lower Extremity Assessment: Overall WFL for tasks assessed    Cervical / Trunk Assessment Cervical / Trunk Assessment: Normal  Communication   Communication: No difficulties  Cognition Arousal/Alertness: Awake/alert Behavior During Therapy: WFL for tasks assessed/performed Overall Cognitive Status: Within Functional Limits for tasks assessed  General Comments      Exercises     Assessment/Plan    PT Assessment Patent does not need any further PT services  PT Problem List         PT Treatment Interventions      PT Goals (Current goals can be found in the Care Plan section)  Acute Rehab PT Goals Patient Stated Goal: to go home PT Goal  Formulation: All assessment and education complete, DC therapy Time For Goal Achievement: 01/12/21 Potential to Achieve Goals: Good    Frequency     Barriers to discharge        Co-evaluation               AM-PAC PT "6 Clicks" Mobility  Outcome Measure Help needed turning from your back to your side while in a flat bed without using bedrails?: None Help needed moving from lying on your back to sitting on the side of a flat bed without using bedrails?: None Help needed moving to and from a bed to a chair (including a wheelchair)?: None Help needed standing up from a chair using your arms (e.g., wheelchair or bedside chair)?: None Help needed to walk in hospital room?: None Help needed climbing 3-5 steps with a railing? : None 6 Click Score: 24    End of Session   Activity Tolerance: Patient tolerated treatment well Patient left: in bed;with call bell/phone within reach;with nursing/sitter in room Nurse Communication: Mobility status PT Visit Diagnosis: Muscle weakness (generalized) (M62.81)    Time: [id number] PT Time Calculation (min) (ACUTE ONLY): 16 min   Charges:   PT Evaluation $PT Eval Low Complexity: 1 Low          Kayce Betty A. Gilford Rile PT, DPT Acute Rehabilitation Services Pager [phone] Office [phone]   Linna Hoff 12/29/2020, 9:03 AM

## 2020-12-29 NOTE — Discharge Summary (Signed)
Physician Discharge Summary  Sarah Duncan [id number] DOB: [birth year] DOA: 12/27/2020  PCP: Harrison Mons, PA  Admit date: 12/27/2020 Discharge date: 12/29/2020  Admitted From: Home Disposition:  Home  Discharge Condition:Stable CODE STATUS:FULL Diet recommendation:  Regular   Brief/Interim Summary: HPI: Sarah Duncan is a 55 year old female with history of osteoarthritis, anxiety, depression, DJD, fibroids, solitary kidney, was seen in [facility] ED on Sunday 8/2 after 2 generalized tonic-clonic seizures while sleeping, as witnessed by her wife.  History of consuming 2 THC Gummies the night before at a party, partner also consumed the same and reportedly was groggy and slept for 6 hours thereafter. -Mental status improved while in the emergency room, was a little slow but answering questions appropriately, work-up was unremarkable including labs and CTA head and neck, subsequently discharged home. -On Monday 8/8 she was groggy all day, extremely slightly slow to respond, foggy, noted to be mumbling and staring off into space at times, brought to [place] ED, labs again were unremarkable, case was discussed with neurology, recommended MRI and EEG, transferred from [place] ED to [facility] today.  She denies any changes in her medications, takes Cymbalta Zanaflex and BuSpar which has been unchanged for years, no fevers or chills, only illicit use was THC Gummies on Saturday night  Hospital Course:  Patient's hospital course remained stable.  She was completely alert and oriented today.  Denies any complaints except for some headache.  Her seizure episode was most likely secondary to marijuana intake.  EEG was done which did not show an epileptiform discharges or seizures.  She was also seen by physical therapy, no follow-up recommended.  She is medically stable for discharge home today.     Discharge Diagnoses:  Active Problems:   Seizures ([place])    Seizure [facility])    Discharge Instructions  Discharge Instructions     Diet general   Complete by: As directed    Discharge instructions   Complete by: As directed    1)Please stop taking marijuana   Increase activity slowly   Complete by: As directed       Allergies as of 12/29/2020   No Known Allergies      Medication List     STOP taking these medications    baclofen 10 MG tablet Commonly known as: LIORESAL   diazepam 5 MG tablet Commonly known as: VALIUM   HYDROcodone-acetaminophen 5-325 MG tablet Commonly known as: NORCO/VICODIN       TAKE these medications    busPIRone 15 MG tablet Commonly known as: BUSPAR Take 15 mg by mouth 2 (two) times daily.   DULoxetine 60 MG capsule Commonly known as: Cymbalta Take 1 capsule (60 mg total) by mouth daily. What changed: when to take this   Melatonin 10 MG Tabs Take 20 mg by mouth daily as needed (sleep).   tiZANidine 4 MG tablet Commonly known as: ZANAFLEX Take 0.5-1 tablets (2-4 mg total) by mouth every 6 (six) hours as needed for muscle spasms. Not more than 3 times daily. What changed:  how much to take when to take this additional instructions       ASK your doctor about these medications    multivitamin with minerals Tabs tablet Take 2 tablets by mouth daily.   polyethylene glycol 17 g packet Commonly known as: MIRALAX / GLYCOLAX Take 17 g by mouth daily as needed for mild constipation.   traMADol 50 MG tablet Commonly known as: ULTRAM Take 2  tablets (100 mg total) by mouth every 6 (six) hours as needed.        Follow-up Information     Jacqulynn Cadet [place], [place]. Schedule an appointment as soon as possible for a visit in 1 week(s).   Specialty: Family Medicine Contact information: [place] [address] [phone]                No Known Allergies  Consultations: None   Procedures/Studies: CT HEAD WO CONTRAST (5MM)  Result Date:  12/26/2020 CLINICAL DATA:  Seizure, nontraumatic (Age >= 67y) EXAM: CT HEAD WITHOUT CONTRAST TECHNIQUE: Contiguous axial images were obtained from the base of the skull through the vertex without intravenous contrast. COMPARISON:  None. FINDINGS: Brain: No acute intracranial abnormality. Specifically, no hemorrhage, hydrocephalus, mass lesion, acute infarction, or significant intracranial injury. Vascular: No hyperdense vessel or unexpected calcification. Skull: No acute calvarial abnormality. Sinuses/Orbits: No acute findings Other: None IMPRESSION: Normal study. Electronically Signed   By: Rolm Baptise M.D.   On: 12/26/2020 16:58   MR BRAIN WO CONTRAST  Result Date: 12/28/2020 CLINICAL DATA:  Seizure, abnormal neuro exam seizure EXAM: MRI HEAD WITHOUT CONTRAST TECHNIQUE: Multiplanar, multiecho pulse sequences of the brain and surrounding structures were obtained without intravenous contrast. COMPARISON:  CT head 12/26/2020. FINDINGS: Motion limited study.  Within this limitation: Brain: No acute infarction, hemorrhage, hydrocephalus, extra-axial collection or mass lesion. There are a few scattered T2 hyperintensities in the white matter, which are nonspecific but within normal limits for patient age. The hippocampi are within normal limits and symmetric in size/signal. Vascular: Major arterial flow voids are maintained at the skull base. Skull and upper cervical spine: Normal marrow signal. Sinuses/Orbits: Clear sinuses.  Unremarkable orbits. Other: No mastoid effusions. IMPRESSION: No evidence of acute intracranial abnormality. Electronically Signed   By: Margaretha Sheffield MD   On: 12/28/2020 19:29   EEG adult  Result Date: 12/29/2020 Lora Havens, MD     12/29/2020 12:38 PM Patient Name: Sarah Duncan MRN: [id number] Epilepsy Attending: Lora Havens Referring Physician/Provider: Dr Domenic Polite Date: 12/29/2020 Duration: 23.33 mins Patient history: 55 year old female with history of  seizures who presented with altered mental status.  EEG to evaluate for seizures. Level of alertness: Awake, drowsy AEDs during EEG study: None Technical aspects: This EEG study was done with scalp electrodes positioned according to the 10-20 International system of electrode placement. Electrical activity was acquired at a sampling rate of '500Hz'$  and reviewed with a high frequency filter of '70Hz'$  and a low frequency filter of '1Hz'$ . EEG data were recorded continuously and digitally stored. Description: The posterior dominant rhythm consists of 8-'9Hz'$  activity of moderate voltage (25-35 uV) seen predominantly in posterior head regions, symmetric and reactive to eye opening and eye closing. Drowsiness was characterized by attenuation of the posterior background rhythm. Hyperventilation and photic stimulation were not performed.   IMPRESSION: This study is within normal limits. No seizures or epileptiform discharges were seen throughout the recording. [place]   CT ANGIO HEAD CODE STROKE  Result Date: 12/26/2020 CLINICAL DATA:  Stroke/TIA, assess extracranial arteries; Stroke/TIA, assess intracranial arteries EXAM: CT ANGIOGRAPHY HEAD AND NECK TECHNIQUE: Multidetector CT imaging of the head and neck was performed using the standard protocol during bolus administration of intravenous contrast. Multiplanar CT image reconstructions and MIPs were obtained to evaluate the vascular anatomy. Carotid stenosis measurements (when applicable) are obtained utilizing NASCET criteria, using the distal internal carotid diameter as the denominator. CONTRAST:  26m OMNIPAQUE IOHEXOL 350 MG/ML SOLN COMPARISON:  None. FINDINGS: CTA NECK FINDINGS SKELETON: There is no bony spinal canal stenosis. No lytic or blastic lesion. OTHER NECK: Normal pharynx, larynx and major salivary glands. No cervical lymphadenopathy. Unremarkable thyroid gland. UPPER CHEST: No pneumothorax or pleural effusion. No nodules or masses. AORTIC ARCH: There is  no calcific atherosclerosis of the aortic arch. There is no aneurysm, dissection or hemodynamically significant stenosis of the visualized portion of the aorta. Conventional 3 vessel aortic branching pattern. The visualized proximal subclavian arteries are widely patent. RIGHT CAROTID SYSTEM: Normal without aneurysm, dissection or stenosis. LEFT CAROTID SYSTEM: Normal without aneurysm, dissection or stenosis. VERTEBRAL ARTERIES: Left dominant configuration. Both origins are clearly patent. There is no dissection, occlusion or flow-limiting stenosis to the skull base (V1-V3 segments). CTA HEAD FINDINGS POSTERIOR CIRCULATION: --Vertebral arteries: Normal V4 segments. --Inferior cerebellar arteries: Normal. --Basilar artery: Normal. --Superior cerebellar arteries: Normal. --Posterior cerebral arteries (PCA): Normal. ANTERIOR CIRCULATION: --Intracranial internal carotid arteries: Normal. --Anterior cerebral arteries (ACA): Normal. Both A1 segments are present. Patent anterior communicating artery (a-comm). --Middle cerebral arteries (MCA): Normal. VENOUS SINUSES: As permitted by contrast timing, patent. ANATOMIC VARIANTS: None Review of the MIP images confirms the above findings. IMPRESSION: Normal CTA of the head and neck. Electronically Signed   By: KUlyses JarredM.D.   On: 12/26/2020 21:26   CT ANGIO NECK CODE STROKE  Result Date: 12/26/2020 CLINICAL DATA:  Stroke/TIA, assess extracranial arteries; Stroke/TIA, assess intracranial arteries EXAM: CT ANGIOGRAPHY HEAD AND NECK TECHNIQUE: Multidetector CT imaging of the head and neck was performed using the standard protocol during bolus administration of intravenous contrast. Multiplanar CT image reconstructions and MIPs were obtained to evaluate the vascular anatomy. Carotid stenosis measurements (when applicable) are obtained utilizing NASCET criteria, using the distal internal carotid diameter as the denominator. CONTRAST:  778mOMNIPAQUE IOHEXOL 350 MG/ML SOLN  COMPARISON:  None. FINDINGS: CTA NECK FINDINGS SKELETON: There is no bony spinal canal stenosis. No lytic or blastic lesion. OTHER NECK: Normal pharynx, larynx and major salivary glands. No cervical lymphadenopathy. Unremarkable thyroid gland. UPPER CHEST: No pneumothorax or pleural effusion. No nodules or masses. AORTIC ARCH: There is no calcific atherosclerosis of the aortic arch. There is no aneurysm, dissection or hemodynamically significant stenosis of the visualized portion of the aorta. Conventional 3 vessel aortic branching pattern. The visualized proximal subclavian arteries are widely patent. RIGHT CAROTID SYSTEM: Normal without aneurysm, dissection or stenosis. LEFT CAROTID SYSTEM: Normal without aneurysm, dissection or stenosis. VERTEBRAL ARTERIES: Left dominant configuration. Both origins are clearly patent. There is no dissection, occlusion or flow-limiting stenosis to the skull base (V1-V3 segments). CTA HEAD FINDINGS POSTERIOR CIRCULATION: --Vertebral arteries: Normal V4 segments. --Inferior cerebellar arteries: Normal. --Basilar artery: Normal. --Superior cerebellar arteries: Normal. --Posterior cerebral arteries (PCA): Normal. ANTERIOR CIRCULATION: --Intracranial internal carotid arteries: Normal. --Anterior cerebral arteries (ACA): Normal. Both A1 segments are present. Patent anterior communicating artery (a-comm). --Middle cerebral arteries (MCA): Normal. VENOUS SINUSES: As permitted by contrast timing, patent. ANATOMIC VARIANTS: None Review of the MIP images confirms the above findings. IMPRESSION: Normal CTA of the head and neck. Electronically Signed   By: KeUlyses Jarred.D.   On: 12/26/2020 21:26      Subjective:  Patient seen and examined at the bedside this morning. Alert and oriented. Hemodynamically stable for discharge.   Discharge Exam: Vitals:   12/29/20 0759 12/29/20 1145  BP: 134/80 135/82  Pulse: 79 71  Resp: 20 20  Temp: 98 F (36.7 C) 98.2 F (36.8  C)  SpO2: 100%  97%   Vitals:   12/28/20 2353 12/29/20 0349 12/29/20 0759 12/29/20 1145  BP: 124/73 132/75 134/80 135/82  Pulse: 91 74 79 71  Resp: '16 17 20 20  '$ Temp: 98.6 F (37 C) 97.7 F (36.5 C) 98 F (36.7 C) 98.2 F (36.8 C)  TempSrc: Oral Oral Oral Oral  SpO2: 98% 99% 100% 97%  Weight:      Height:        General: Pt is alert, awake, not in acute distress Cardiovascular: RRR, S1/S2 +, no rubs, no gallops Respiratory: CTA bilaterally, no wheezing, no rhonchi Abdominal: Soft, NT, ND, bowel sounds + Extremities: no edema, no cyanosis    The results of significant diagnostics from this hospitalization (including imaging, microbiology, ancillary and laboratory) are listed below for reference.     Microbiology: Recent Results (from the past 240 hour(s))  Resp Panel by RT-PCR (Flu A&B, Covid) Nasopharyngeal Swab     Status: None   Collection Time: 12/26/20  7:15 PM   Specimen: Nasopharyngeal Swab; Nasopharyngeal(NP) swabs in vial transport medium  Result Value Ref Range Status   SARS Coronavirus 2 by RT PCR NEGATIVE NEGATIVE Final    Comment: (NOTE) SARS-CoV-2 target nucleic acids are NOT DETECTED.  The SARS-CoV-2 RNA is generally detectable in upper respiratory specimens during the acute phase of infection. The lowest concentration of SARS-CoV-2 viral copies this assay can detect is 138 copies/mL. A negative result does not preclude SARS-Cov-2 infection and should not be used as the sole basis for treatment or other patient management decisions. A negative result may occur with  improper specimen collection/handling, submission of specimen other than nasopharyngeal swab, presence of viral mutation(s) within the areas targeted by this assay, and inadequate number of viral copies(<138 copies/mL). A negative result must be combined with clinical observations, patient history, and epidemiological information. The expected result is Negative.  Fact Sheet for Patients:   EntrepreneurPulse.com.au  Fact Sheet for Healthcare Providers:  IncredibleEmployment.be  This test is no t yet approved or cleared by the [place] FDA and  has been authorized for detection and/or diagnosis of SARS-CoV-2 by FDA under an Emergency Use Authorization (EUA). This EUA will remain  in effect (meaning this test can be used) for the duration of the COVID-19 declaration under Section 564(b)(1) of the Act, 21 U.S.C.section 360bbb-3(b)(1), unless the authorization is terminated  or revoked sooner.       Influenza A by PCR NEGATIVE NEGATIVE Final   Influenza B by PCR NEGATIVE NEGATIVE Final    Comment: (NOTE) The Xpert Xpress SARS-CoV-2/FLU/RSV plus assay is intended as an aid in the diagnosis of influenza from Nasopharyngeal swab specimens and should not be used as a sole basis for treatment. Nasal washings and aspirates are unacceptable for Xpert Xpress SARS-CoV-2/FLU/RSV testing.  Fact Sheet for Patients: EntrepreneurPulse.com.au  Fact Sheet for Healthcare Providers: IncredibleEmployment.be  This test is not yet approved or cleared by the [place] FDA and has been authorized for detection and/or diagnosis of SARS-CoV-2 by FDA under an Emergency Use Authorization (EUA). This EUA will remain in effect (meaning this test can be used) for the duration of the COVID-19 declaration under Section 564(b)(1) of the Act, 21 U.S.C. section 360bbb-3(b)(1), unless the authorization is terminated or revoked.  Performed at [facility], [place] [address], [place], [place] [address]   Resp Panel by RT-PCR (Flu A&B, Covid) Urine, Clean Catch     Status: None   Collection Time: 12/27/20  8:25 PM   Specimen: Urine, Clean Catch; Nasopharyngeal(NP) swabs in vial transport medium  Result Value Ref Range Status   SARS Coronavirus 2 by RT PCR NEGATIVE NEGATIVE Final    Comment: (NOTE) SARS-CoV-2 target  nucleic acids are NOT DETECTED.  The SARS-CoV-2 RNA is generally detectable in upper respiratory specimens during the acute phase of infection. The lowest concentration of SARS-CoV-2 viral copies this assay can detect is 138 copies/mL. A negative result does not preclude SARS-Cov-2 infection and should not be used as the sole basis for treatment or other patient management decisions. A negative result may occur with  improper specimen collection/handling, submission of specimen other than nasopharyngeal swab, presence of viral mutation(s) within the areas targeted by this assay, and inadequate number of viral copies(<138 copies/mL). A negative result must be combined with clinical observations, patient history, and epidemiological information. The expected result is Negative.  Fact Sheet for Patients:  EntrepreneurPulse.com.au  Fact Sheet for Healthcare Providers:  IncredibleEmployment.be  This test is no t yet approved or cleared by the [place] FDA and  has been authorized for detection and/or diagnosis of SARS-CoV-2 by FDA under an Emergency Use Authorization (EUA). This EUA will remain  in effect (meaning this test can be used) for the duration of the COVID-19 declaration under Section 564(b)(1) of the Act, 21 U.S.C.section 360bbb-3(b)(1), unless the authorization is terminated  or revoked sooner.       Influenza A by PCR NEGATIVE NEGATIVE Final   Influenza B by PCR NEGATIVE NEGATIVE Final    Comment: (NOTE) The Xpert Xpress SARS-CoV-2/FLU/RSV plus assay is intended as an aid in the diagnosis of influenza from Nasopharyngeal swab specimens and should not be used as a sole basis for treatment. Nasal washings and aspirates are unacceptable for Xpert Xpress SARS-CoV-2/FLU/RSV testing.  Fact Sheet for Patients: EntrepreneurPulse.com.au  Fact Sheet for Healthcare  Providers: IncredibleEmployment.be  This test is not yet approved or cleared by the [place] FDA and has been authorized for detection and/or diagnosis of SARS-CoV-2 by FDA under an Emergency Use Authorization (EUA). This EUA will remain in effect (meaning this test can be used) for the duration of the COVID-19 declaration under Section 564(b)(1) of the Act, 21 U.S.C. section 360bbb-3(b)(1), unless the authorization is terminated or revoked.  Performed at [facility], [address] [place], [place] [address]      Labs: BNP (last 3 results) No results for input(s): BNP in the last 8760 hours. Basic Metabolic Panel: Recent Labs  Lab 12/26/20 1520 12/27/20 2036 12/28/20 1737 12/29/20 0341  NA 139 140  --  139  K 3.8 3.5  --  3.7  CL 106 103  --  102  CO2 25 28  --  29  GLUCOSE 127* 85  --  159*  BUN 11 12  --  9  CREATININE 1.13* 0.96 1.05* 1.09*  CALCIUM 9.4 9.4  --  9.6   Liver Function Tests: Recent Labs  Lab 12/27/20 2036 12/29/20 0341  AST 26 25  ALT 22 19  ALKPHOS 151* 134*  BILITOT 0.3 0.4  PROT 7.4 6.8  ALBUMIN 3.8 3.2*   No results for input(s): LIPASE, AMYLASE in the last 168 hours. No results for input(s): AMMONIA in the last 168 hours. CBC: Recent Labs  Lab 12/26/20 1520 12/27/20 2036 12/28/20 1737 12/29/20 0341  WBC 5.0 5.4 5.8 5.7  NEUTROABS  --  2.0  --   --   HGB 12.3 12.7 12.7 11.5*  HCT 40.2 39.7 40.5 36.7  MCV 86.6 85.2 85.3 85.0  PLT 252 253 267 258   Cardiac Enzymes: No results for input(s): CKTOTAL, CKMB, CKMBINDEX, TROPONINI in the last 168 hours. BNP: Invalid input(s): POCBNP CBG: Recent Labs  Lab 12/26/20 1612  GLUCAP 118*   D-Dimer No results for input(s): DDIMER in the last 72 hours. Hgb A1c No results for input(s): HGBA1C in the last 72 hours. Lipid Profile No results for input(s): CHOL, HDL, LDLCALC, TRIG, CHOLHDL, LDLDIRECT in the last 72 hours. Thyroid function studies No  results for input(s): TSH, T4TOTAL, T3FREE, THYROIDAB in the last 72 hours.  Invalid input(s): FREET3 Anemia work up No results for input(s): VITAMINB12, FOLATE, FERRITIN, TIBC, IRON, RETICCTPCT in the last 72 hours. Urinalysis    Component Value Date/Time   COLORURINE YELLOW 12/09/2018 1515   APPEARANCEUR CLEAR 12/09/2018 1515   LABSPEC 1.020 12/09/2018 1515   PHURINE 5.0 12/09/2018 1515   GLUCOSEU NEGATIVE 12/09/2018 1515   HGBUR NEGATIVE 12/09/2018 1515   BILIRUBINUR NEGATIVE 12/09/2018 1515   BILIRUBINUR negative 10/12/2015 [place] 12/09/2018 1515   PROTEINUR NEGATIVE 12/09/2018 1515   UROBILINOGEN 0.2 10/12/2015 1614   NITRITE NEGATIVE 12/09/2018 1515   LEUKOCYTESUR NEGATIVE 12/09/2018 1515   Sepsis Labs Invalid input(s): PROCALCITONIN,  WBC,  LACTICIDVEN Microbiology Recent Results (from the past 240 hour(s))  Resp Panel by RT-PCR (Flu A&B, Covid) Nasopharyngeal Swab     Status: None   Collection Time: 12/26/20  7:15 PM   Specimen: Nasopharyngeal Swab; Nasopharyngeal(NP) swabs in vial transport medium  Result Value Ref Range Status   SARS Coronavirus 2 by RT PCR NEGATIVE NEGATIVE Final    Comment: (NOTE) SARS-CoV-2 target nucleic acids are NOT DETECTED.  The SARS-CoV-2 RNA is generally detectable in upper respiratory specimens during the acute phase of infection. The lowest concentration of SARS-CoV-2 viral copies this assay can detect is 138 copies/mL. A negative result does not preclude SARS-Cov-2 infection and should not be used as the sole basis for treatment or other patient management decisions. A negative result may occur with  improper specimen collection/handling, submission of specimen other than nasopharyngeal swab, presence of viral mutation(s) within the areas targeted by this assay, and inadequate number of viral copies(<138 copies/mL). A negative result must be combined with clinical observations, patient history, and  epidemiological information. The expected result is Negative.  Fact Sheet for Patients:  EntrepreneurPulse.com.au  Fact Sheet for Healthcare Providers:  IncredibleEmployment.be  This test is no t yet approved or cleared by the [place] FDA and  has been authorized for detection and/or diagnosis of SARS-CoV-2 by FDA under an Emergency Use Authorization (EUA). This EUA will remain  in effect (meaning this test can be used) for the duration of the COVID-19 declaration under Section 564(b)(1) of the Act, 21 U.S.C.section 360bbb-3(b)(1), unless the authorization is terminated  or revoked sooner.       Influenza A by PCR NEGATIVE NEGATIVE Final   Influenza B by PCR NEGATIVE NEGATIVE Final    Comment: (NOTE) The Xpert Xpress SARS-CoV-2/FLU/RSV plus assay is intended as an aid in the diagnosis of influenza from Nasopharyngeal swab specimens and should not be used as a sole basis for treatment. Nasal washings and aspirates are unacceptable for Xpert Xpress SARS-CoV-2/FLU/RSV testing.  Fact Sheet for Patients: EntrepreneurPulse.com.au  Fact Sheet for Healthcare Providers: IncredibleEmployment.be  This test is not yet approved or cleared by the [place] FDA and has been authorized for detection and/or diagnosis of SARS-CoV-2  by FDA under an Emergency Use Authorization (EUA). This EUA will remain in effect (meaning this test can be used) for the duration of the COVID-19 declaration under Section 564(b)(1) of the Act, 21 U.S.C. section 360bbb-3(b)(1), unless the authorization is terminated or revoked.  Performed at [facility], [place] [address] [place], [place] [address]   Resp Panel by RT-PCR (Flu A&B, Covid) Urine, Clean Catch     Status: None   Collection Time: 12/27/20  8:25 PM   Specimen: Urine, Clean Catch; Nasopharyngeal(NP) swabs in vial transport medium  Result Value Ref Range Status    SARS Coronavirus 2 by RT PCR NEGATIVE NEGATIVE Final    Comment: (NOTE) SARS-CoV-2 target nucleic acids are NOT DETECTED.  The SARS-CoV-2 RNA is generally detectable in upper respiratory specimens during the acute phase of infection. The lowest concentration of SARS-CoV-2 viral copies this assay can detect is 138 copies/mL. A negative result does not preclude SARS-Cov-2 infection and should not be used as the sole basis for treatment or other patient management decisions. A negative result may occur with  improper specimen collection/handling, submission of specimen other than nasopharyngeal swab, presence of viral mutation(s) within the areas targeted by this assay, and inadequate number of viral copies(<138 copies/mL). A negative result must be combined with clinical observations, patient history, and epidemiological information. The expected result is Negative.  Fact Sheet for Patients:  EntrepreneurPulse.com.au  Fact Sheet for Healthcare Providers:  IncredibleEmployment.be  This test is no t yet approved or cleared by the [place] FDA and  has been authorized for detection and/or diagnosis of SARS-CoV-2 by FDA under an Emergency Use Authorization (EUA). This EUA will remain  in effect (meaning this test can be used) for the duration of the COVID-19 declaration under Section 564(b)(1) of the Act, 21 U.S.C.section 360bbb-3(b)(1), unless the authorization is terminated  or revoked sooner.       Influenza A by PCR NEGATIVE NEGATIVE Final   Influenza B by PCR NEGATIVE NEGATIVE Final    Comment: (NOTE) The Xpert Xpress SARS-CoV-2/FLU/RSV plus assay is intended as an aid in the diagnosis of influenza from Nasopharyngeal swab specimens and should not be used as a sole basis for treatment. Nasal washings and aspirates are unacceptable for Xpert Xpress SARS-CoV-2/FLU/RSV testing.  Fact Sheet for  Patients: EntrepreneurPulse.com.au  Fact Sheet for Healthcare Providers: IncredibleEmployment.be  This test is not yet approved or cleared by the [place] FDA and has been authorized for detection and/or diagnosis of SARS-CoV-2 by FDA under an Emergency Use Authorization (EUA). This EUA will remain in effect (meaning this test can be used) for the duration of the COVID-19 declaration under Section 564(b)(1) of the Act, 21 U.S.C. section 360bbb-3(b)(1), unless the authorization is terminated or revoked.  Performed at [facility], [address] [place], [place] [address]     Please note: You were cared for by a hospitalist during your hospital stay. Once you are discharged, your primary care physician will handle any further medical issues. Please note that NO REFILLS for any discharge medications will be authorized once you are discharged, as it is imperative that you return to your primary care physician (or establish a relationship with a primary care physician if you do not have one) for your post hospital discharge needs so that they can reassess your need for medications and monitor your lab values.    Time coordinating discharge: 40 minutes  SIGNED:   Shelly Coss, MD  [facility]  12/29/2020, 12:55 PM Pager [phone]  If 7PM-7AM, please contact night-coverage [email or web address] Password [email or web address]

## 2020-12-29 NOTE — Progress Notes (Signed)
EEG complete - results pending 

## 2021-02-18 ENCOUNTER — Encounter: Payer: Medicare (Managed Care) | Admitting: Physical Medicine & Rehabilitation

## 2021-03-03 ENCOUNTER — Encounter
Payer: Medicare (Managed Care) | Attending: Physical Medicine & Rehabilitation | Admitting: Physical Medicine & Rehabilitation

## 2021-03-03 ENCOUNTER — Other Ambulatory Visit: Payer: Self-pay

## 2021-03-03 ENCOUNTER — Encounter: Payer: Self-pay | Admitting: Physical Medicine & Rehabilitation

## 2021-03-03 VITALS — BP 127/87 | HR 85 | Ht 67.0 in | Wt 200.0 lb

## 2021-03-03 DIAGNOSIS — M47817 Spondylosis without myelopathy or radiculopathy, lumbosacral region: Secondary | ICD-10-CM | POA: Insufficient documentation

## 2021-03-03 MED ORDER — DIAZEPAM 10 MG PO TABS: 10.0000 mg | ORAL_TABLET | Freq: Once | ORAL | 0 refills | Status: AC

## 2021-03-03 NOTE — Patient Instructions (Signed)
Will ask pre authorization for  RIght L3-4-5 Medial branch blocks

## 2021-03-03 NOTE — Progress Notes (Signed)
Subjective:    Patient ID: Sarah Duncan, female    DOB: [birth year], 55 y.o.   MRN: [id number]  HPI  Bilateral low back pain with RLE sciatica, had lumbar laminectomy surgery in [place] in 2016, never "bounced back " from this, had lumbar fusion with Dr Louanne Skye, right transforaminal L3-4 and L4-5 on 12/10/2018. Has tried PT which caused increased pain   Dr Ernestina Patches did facet injections but not medial branch blocks at L2-L3 on 05/28/2019, orthopedic follow-up 2 weeks later patient states it was not helpful.  No immediate postinjection pain scores were recorded.  Dr Francesco Runner did injections at [facility], caudal [facility] which according to MD notes resolve the right lower extremity pain.  The patient also had recommendation for a right sacroiliac injection.  Do not see that this occurred however.  Also seen by Dr. Renaldo Reel who recommended sacroiliac injection and if unsuccessful lumbar medial branch blocks above the level of the fusion.  The fusion was incorrectly identified as L4-5 L5-S1, the correct levels are L3-4 L4-5 based on postoperative x-rays as well as operative report Pain Inventory Average Pain 7 Pain Right Now 7 My pain is constant, stabbing, and aching  In the last 24 hours, has pain interfered with the following? General activity 7 Relation with others 7 Enjoyment of life 7 What TIME of day is your pain at its worst? morning , daytime, evening, and night Sleep (in general) Fair  Pain is worse with: walking, bending, sitting, standing, and some activites Pain improves with: rest Relief from Meds: 4  how many minutes can you walk? 20 minutes (use electrical scooter) ability to climb steps?  no do you drive?  yes Do you have any goals in this area?  yes  disabled: date disabled 2016 I need assistance with the following:  dressing and bathing Do you have any goals in this area?  yes  trouble walking spasms depression anxiety  Any changes since last visit?  yes CT/MRI [facility])  Any changes since last visit?  no    Family History  Problem Relation Age of Onset   Diabetes Mother    Hypertension Mother    Diabetes Brother    Hypertension Brother    Cancer Father    Endometriosis Daughter    Diabetes Daughter    HIV Son    Social History   Socioeconomic History   Marital status: Married    Spouse name: Ronni Rumble   Number of children: 4   Years of education: associates   Highest education level: Not on file  Occupational History   Occupation: Stay at home spouse    Comment: out of work since 03/2015  Tobacco Use   Smoking status: Former    Years: 1.00    Types: Cigarettes   Smokeless tobacco: Never  Vaping Use   Vaping Use: Never used  Substance and Sexual Activity   Alcohol use: Yes    Alcohol/week: 1.0 standard drink    Types: 1 Standard drinks or equivalent per week    Comment: social   Drug use: No   Sexual activity: Yes    Partners: Female  Other Topics Concern   Not on file  Social History Narrative   Divorced. 4 adult children (3 in [place], one in [place]).   Re-Married 05/2015. Lives with her wife.   Her wife's children live locally.   Social Determinants of Health   Financial Resource Strain: Not on file  Food Insecurity: Not on file  Transportation Needs: Not on file  Physical Activity: Not on file  Stress: Not on file  Social Connections: Not on file   Past Surgical History:  Procedure Laterality Date   ABDOMINAL HYSTERECTOMY  2007   CESAREAN SECTION  11/19/1989   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   BP 127/87   Pulse 85   Ht 5\' 7"  (1.702 m)   Wt 200 lb (90.7 kg)   SpO2 96%   BMI 31.32 kg/m   Opioid Risk Score:   Fall Risk Score:  `1  Depression screen PHQ 2/9  Depression screen [facility] 2/9 03/03/2021 10/08/2017 09/07/2017 06/05/2017 04/17/2017 02/28/2017 01/08/2017  Decreased Interest 2 0 2 1 2  0 2  Down, Depressed, Hopeless 2 0 2 1 2  0 1  PHQ - 2 Score 4 0 4 2 4  0 3  Altered sleeping 2 - 3 2 1  - 3  Tired, decreased energy 2 - 2 1 2  - 3  Change in appetite 2 - 2 1 2  - 1  Feeling bad or failure about yourself  2 - 2 1 2  - 1  Trouble concentrating 1 - 2 1 2  - 1  Moving slowly or fidgety/restless 1 - 3 2 1  - 3  Suicidal thoughts 0 - 0 0 0 - 0  PHQ-9 Score 14 - 18 10 14  - 15  Difficult doing work/chores - - Somewhat difficult Somewhat difficult Somewhat difficult - Somewhat difficult  Some recent data might be hidden    Review of Systems  Constitutional:  Positive for unexpected weight change.       Weight gain & weight loss  Respiratory:  Positive for apnea.   Musculoskeletal:  Positive for back pain and gait problem.  Psychiatric/Behavioral:  Positive for dysphoric mood.        Anxiety      Objective:   Physical Exam Vitals and nursing note reviewed.  Constitutional:      Appearance: She is obese.  HENT:     Head: Normocephalic and atraumatic.  Eyes:     Extraocular Movements: Extraocular movements intact.     Conjunctiva/sclera: Conjunctivae normal.     Pupils: Pupils are equal, round, and reactive to light.  Cardiovascular:     Rate and Rhythm: Normal rate and regular rhythm.     Heart sounds: Normal heart sounds.  Pulmonary:     Effort: Pulmonary effort is normal. No respiratory distress.     Breath sounds: Normal breath sounds.  Abdominal:     General: Abdomen is flat. Bowel sounds are normal. There is no distension.     Palpations: Abdomen is soft. There is no mass.  Musculoskeletal:      Comments: Mild tenderness palpation at level of fusion as well as above and below. Lumbar range of motion is reduced with flexion extension lateral bending and rotation. Distraction test negative Fabers positive at low  back area mainly above the waist Thigh thrust positive at low back area mainly above the waist There is pain over the PSIS/sacral area to palpation  Skin:    General: Skin is warm and dry.  Neurological:     Mental Status: She is alert and oriented to person, place, and time.  Psychiatric:        Mood and Affect: Mood normal.        Behavior: Behavior normal.          Assessment & Plan:  1.  Chronic low back pain no radicular component.  She has history of L3-4 L4-5 fusion.  She has had a caudal epidural which resolved the radicular pain.  She has had L2-3 facet intra-articular injections but no medial branch blocks at that level.  Medial branch blocks at the level of the L2-3 joints with the L1-2 medial branches.  A greater than 50% response to medial branch blocks x2 at that level would be indicative of probable longer duration response following radiofrequency neurotomy of the the same nerves.  She has had  right greater than left sided pain.  Will block the right side and left left side serve as controlled.  If this is helpful at follow-up repeat x1 If not helpful would recommend sacroiliac injection. Above discussed with the patient who agrees with plan.  No medications were prescribed except for Valium 10 mg prior to injection

## 2021-04-12 ENCOUNTER — Ambulatory Visit: Payer: Medicare (Managed Care) | Admitting: Physical Medicine & Rehabilitation

## 2021-04-22 ENCOUNTER — Encounter: Payer: Self-pay | Admitting: Physical Medicine & Rehabilitation

## 2021-04-22 ENCOUNTER — Encounter
Payer: Medicare (Managed Care) | Attending: Physical Medicine & Rehabilitation | Admitting: Physical Medicine & Rehabilitation

## 2021-04-22 ENCOUNTER — Other Ambulatory Visit: Payer: Self-pay

## 2021-04-22 VITALS — BP 125/83 | HR 83 | Temp 97.9°F | Ht 67.0 in | Wt 200.0 lb

## 2021-04-22 DIAGNOSIS — M47817 Spondylosis without myelopathy or radiculopathy, lumbosacral region: Secondary | ICD-10-CM | POA: Diagnosis not present

## 2021-04-22 NOTE — Patient Instructions (Signed)

## 2021-04-22 NOTE — Progress Notes (Signed)
Right lumbar  L4 medial branch blocks and L5 dorsal ramus injection under fluoroscopic guidance  Indication: Right Lumbar pain which is not relieved by medication management or other conservative care and interfering with self-care and mobility.  Informed consent was obtained after describing risks and benefits of the procedure with the patient, this includes bleeding, bruising, infection, paralysis and medication side effects. The patient wishes to proceed and has given written consent. The patient was placed in a prone position. The lumbar area was marked and prepped with Betadine. One ML of 1% lidocaine was injected into each of 3 areas into the skin and subcutaneous tissue. Then a 22-gauge 5in spinal needle was inserted targeting the junction of the Right S1 superior articular process and sacral ala junction. Needle was advanced under fluoroscopic guidance. Bone contact was made.Isovue 200 was injected x0.5 mL demonstrating no intravascular uptake. Then a solution containing 2% MPF lidocaine was injected x0.5 mL. Then the Right L5 superior articular process in transverse process junction was targeted. Bone contact was made.Isovue 200 was injected x0.5 mL demonstrating no intravascular uptake. Then a solution containing 2% MPF lidocaine was injected x0.5 mL.  Patient tolerated procedure well. Post procedure instructions were given. Please refer to post procedure form.

## 2021-04-22 NOTE — Progress Notes (Signed)
  PROCEDURE RECORD [facility]   Name: Sarah Duncan DOB:20-Feb-1966 MRN: [id number]  Date:04/22/2021  Physician: Alysia Penna, MD    Nurse/CMA: Truman Hayward, CMA   Allergies: No Known Allergies  Consent Signed: Yes.    Is patient diabetic? Yes.    CBG today? Just diagnosed, not sure  Pregnant: Yes.   LMP: No LMP recorded. Patient has had a hysterectomy. (age 53-55)  Anticoagulants: no Anti-inflammatory: no Antibiotics: no  Procedure: Right L3-4-5 Medial Branch Block  Position: Prone Start Time: 1:04 pm End Time: 1:09 pm  Fluoro Time: 27  RN/CMA Truman Hayward, CMA Janiya Millirons, CMA    Time 12:39 pm 1:16 pm    BP 125/83 120/83    Pulse 83 88    Respirations 16 16    O2 Sat 99 98    S/S 6 6    Pain Level 7/10 2/10     D/C home with wife, patient A & O X 3, D/C instructions reviewed, and sits independently.

## 2021-05-16 IMAGING — MR MRI LUMBAR SPINE WITHOUT CONTRAST
4 of 5 series · 27 of 48 positions shown · non-contrast
Comparison: 07/09/2017

CLINICAL DATA: Chronic low back pain radiating down the right leg

EXAM:
MRI LUMBAR SPINE WITHOUT CONTRAST
TECHNIQUE: Multiplanar, multisequence MR imaging of the lumbar spine was
performed. No intravenous contrast was administered.

[Series 3: T2 · sagittal · 4.0mm · 1.09mm/px · 6 of 16 slices shown (1 of 2)]
[im 1/16]
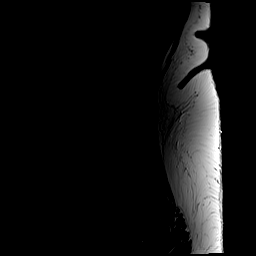
[im 4/16]
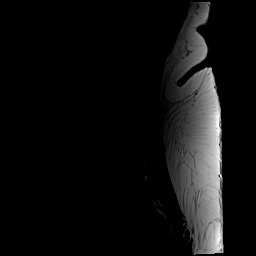
[im 7/16]
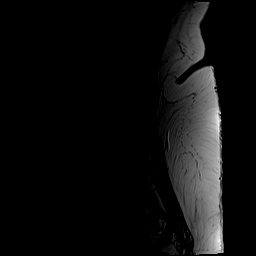
[im 10/16]
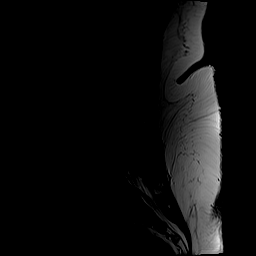
[im 13/16]
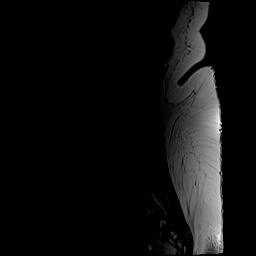
[im 16/16]
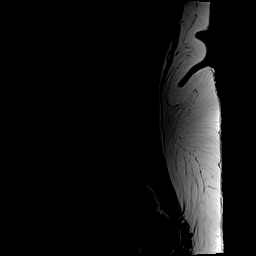

[Series 5: T1 · sagittal · 4.0mm · 1.09mm/px · 6 of 16 slices shown (1 of 2)]
[im 1/16]
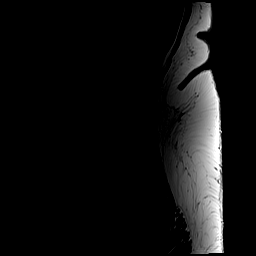
[im 4/16]
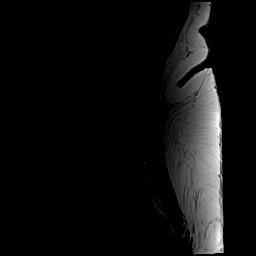
[im 7/16]
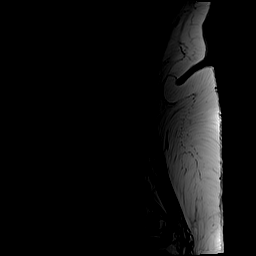
[im 10/16]
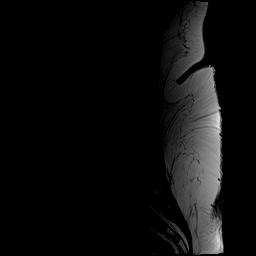
[im 13/16]
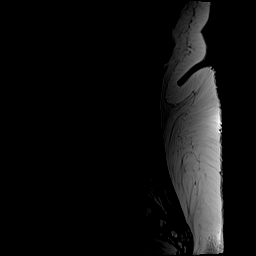
[im 16/16]
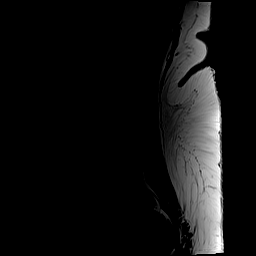

[Series 6: T2 · axial · 4.0mm · 0.39mm/px · z∈[-67,+163]mm · 9 of 41 slices shown (2 of 2)]
[im 1/41]
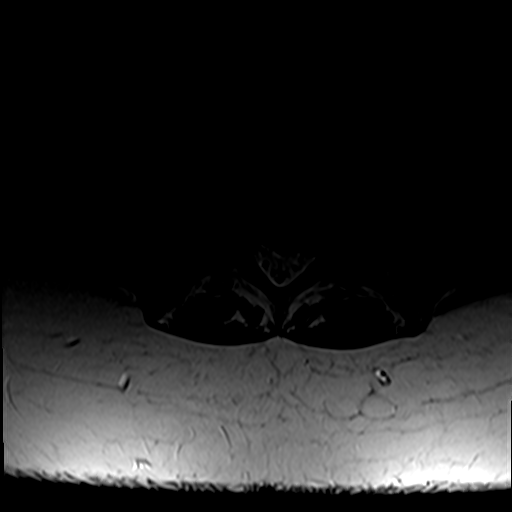
[im 6/41]
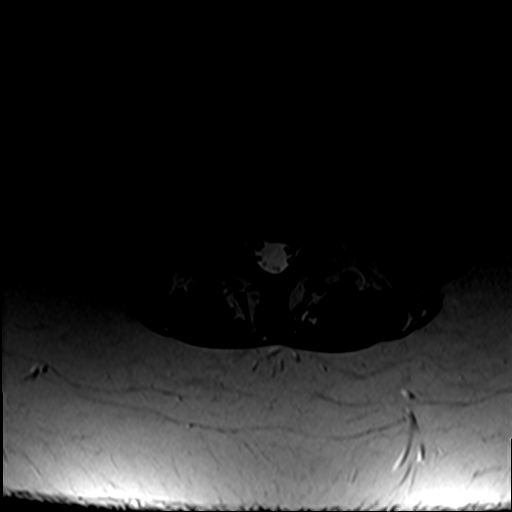
[im 12/41]
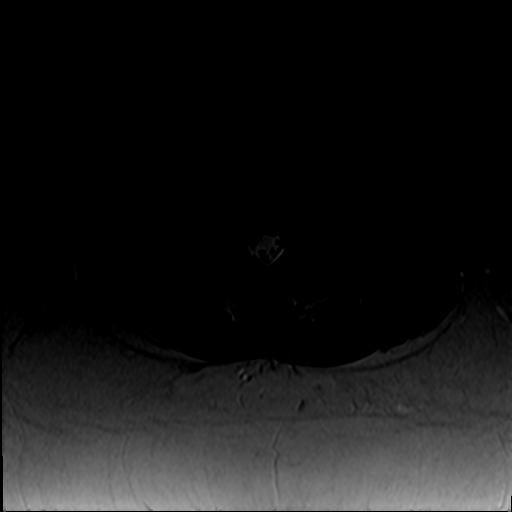
[im 18/41]
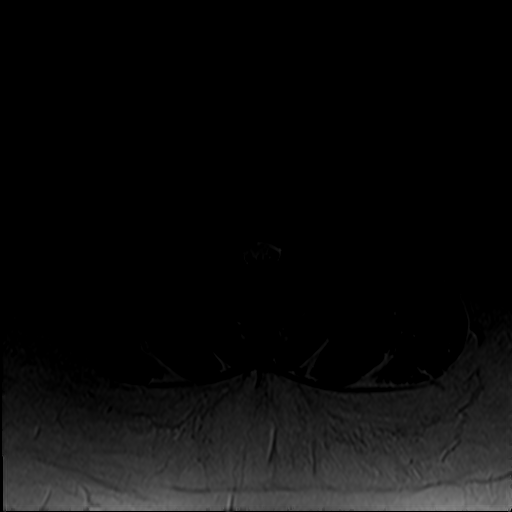
[im 21/41]
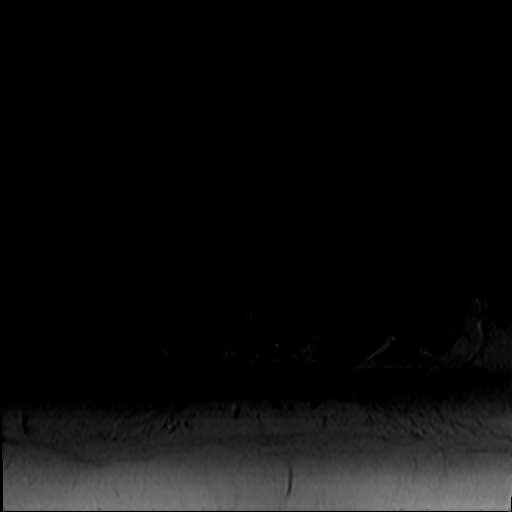
[im 23/41]
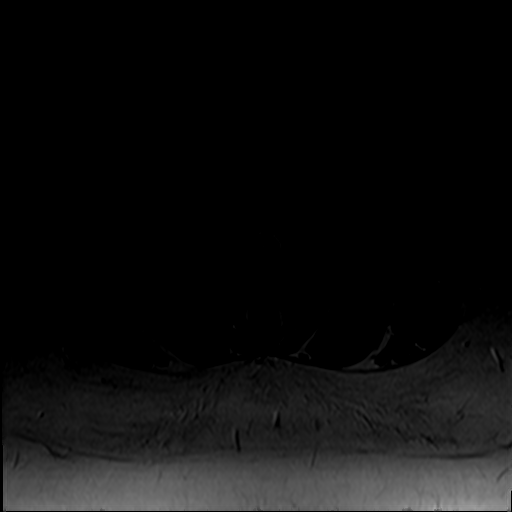
[im 29/41]
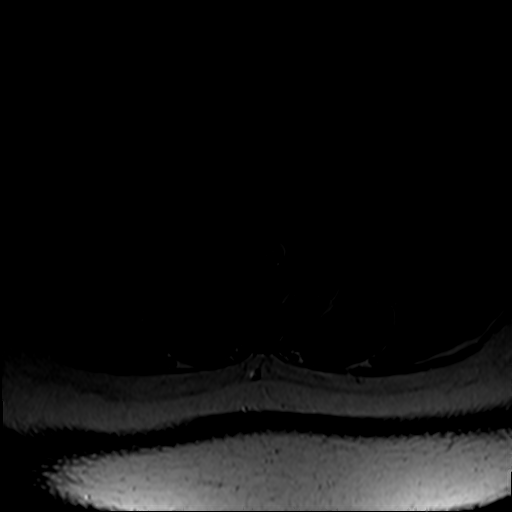
[im 35/41]
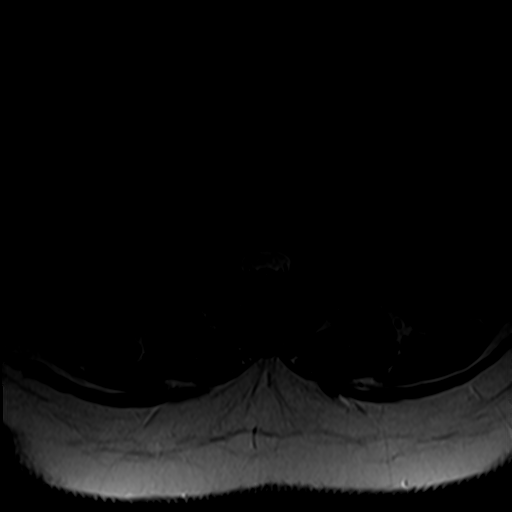
[im 41/41]
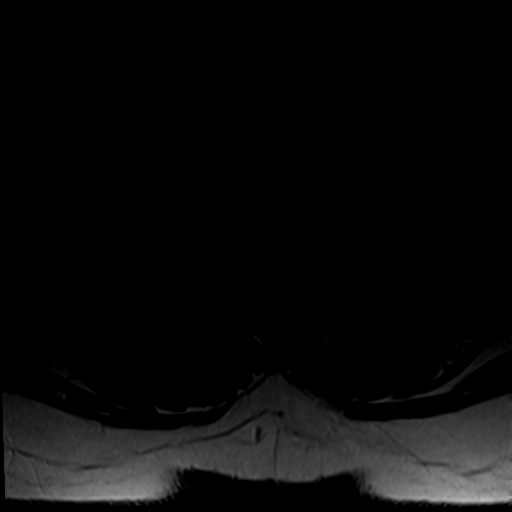

[Series 7: T1 · axial · 4.0mm · 0.39mm/px · z∈[-67,+135]mm · 6 of 41 slices shown (2 of 2)]
[im 1/41]
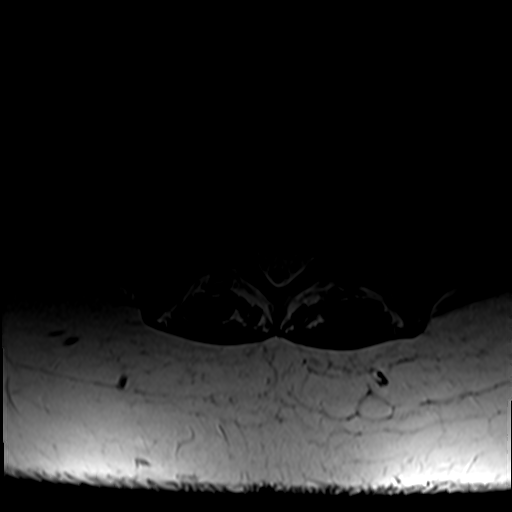
[im 6/41]
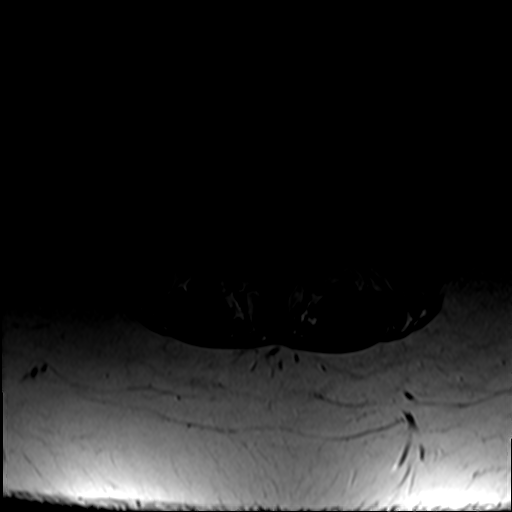
[im 12/41]
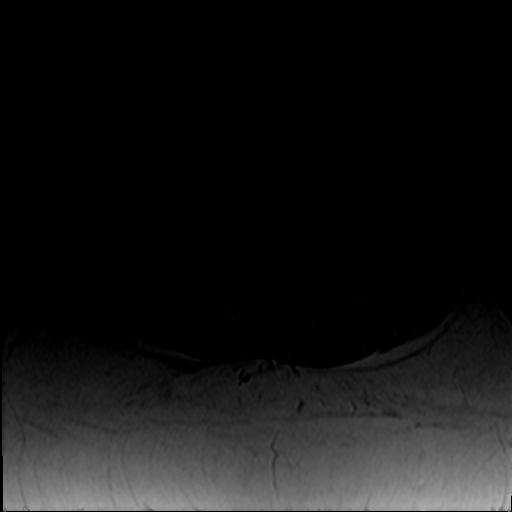
[im 18/41]
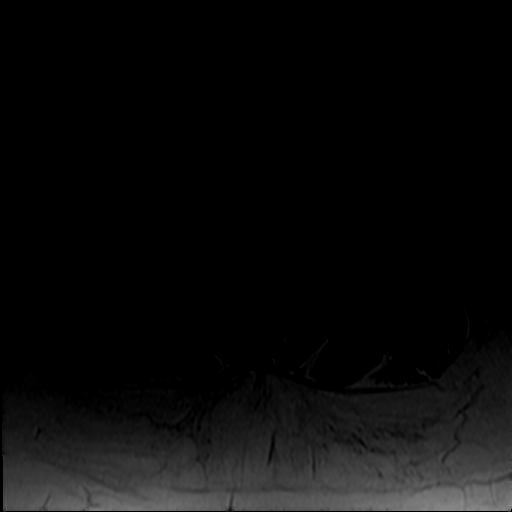
[im 21/41]
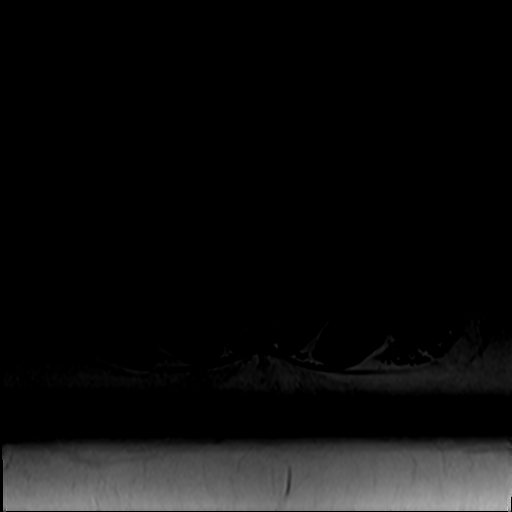
[im 35/41]
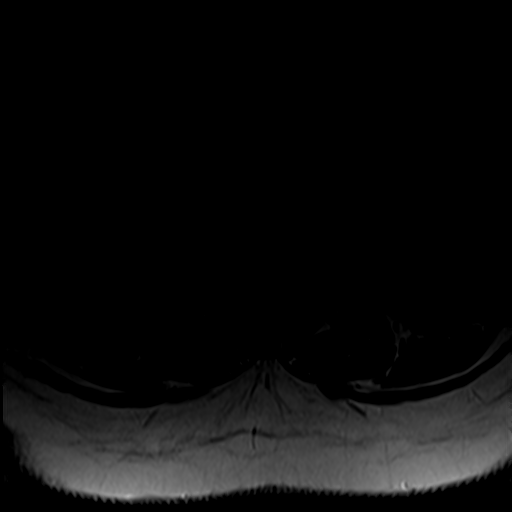

[27 of 48 positions shown; findings below may reference images not displayed]

FINDINGS: Segmentation:  Standard.

Alignment:  Physiologic.

Vertebrae:  No fracture, evidence of discitis, or bone lesion.

Conus medullaris and cauda equina: Conus extends to the T12 level.
Conus and cauda equina appear normal.

Paraspinal and other soft tissues: No acute paraspinal abnormality.

Disc levels:

Disc spaces: Degenerative disc disease with disc height loss at
L4-5.

T12-L1: No significant disc bulge. No evidence of neural foraminal
stenosis. No central canal stenosis.

L1-L2: No significant disc bulge. No evidence of neural foraminal
stenosis. No central canal stenosis. Mild bilateral facet
arthropathy.

L2-L3: No significant disc bulge. No evidence of neural foraminal
stenosis. No central canal stenosis.

L3-L4: Broad-based disc bulge with a small central disc protrusion.
Mild bilateral facet arthropathy. Right lateral recess narrowing. No
foraminal stenosis. No central canal stenosis.

L4-L5: Broad-based disc bulge. Moderate right and mild left facet
arthropathy. Mild bilateral foraminal narrowing. No central canal
stenosis.

L5-S1: No significant disc bulge. No evidence of neural foraminal
stenosis. No central canal stenosis.
IMPRESSION: 1. At L3-4 there is a broad-based disc bulge with a small central
disc protrusion. Mild bilateral facet arthropathy. Right lateral
recess narrowing.
2. At L4-5 there is a broad-based disc bulge. Moderate right and
mild left facet arthropathy. Mild bilateral foraminal narrowing.

## 2021-06-12 ENCOUNTER — Encounter: Payer: Self-pay | Admitting: Physical Medicine & Rehabilitation

## 2021-06-13 ENCOUNTER — Encounter: Payer: Self-pay | Admitting: *Deleted

## 2021-06-13 ENCOUNTER — Other Ambulatory Visit: Payer: Self-pay | Admitting: *Deleted

## 2021-06-13 MED ORDER — DIAZEPAM 10 MG PO TABS: ORAL_TABLET | ORAL | 0 refills | Status: DC

## 2021-06-14 ENCOUNTER — Encounter: Payer: Self-pay | Admitting: Physical Medicine & Rehabilitation

## 2021-06-14 ENCOUNTER — Encounter
Payer: Medicare (Managed Care) | Attending: Physical Medicine & Rehabilitation | Admitting: Physical Medicine & Rehabilitation

## 2021-06-14 ENCOUNTER — Other Ambulatory Visit: Payer: Self-pay

## 2021-06-14 VITALS — BP 146/88 | HR 73 | Temp 97.7°F | Ht 67.0 in | Wt 213.0 lb

## 2021-06-14 DIAGNOSIS — M47817 Spondylosis without myelopathy or radiculopathy, lumbosacral region: Secondary | ICD-10-CM

## 2021-06-14 MED ORDER — DIAZEPAM 10 MG PO TABS: ORAL_TABLET | ORAL | 0 refills | Status: DC

## 2021-06-14 NOTE — Progress Notes (Signed)
Right lumbar  L4 medial branch blocks and L5 dorsal ramus injection under fluoroscopic guidance  Indication: Right Lumbar pain which is not relieved by medication management or other conservative care and interfering with self-care and mobility.  Informed consent was obtained after describing risks and benefits of the procedure with the patient, this includes bleeding, bruising, infection, paralysis and medication side effects. The patient wishes to proceed and has given written consent. The patient was placed in a prone position. The lumbar area was marked and prepped with Betadine. One ML of 1% lidocaine was injected into each of 3 areas into the skin and subcutaneous tissue. Then a 22-gauge 5in spinal needle was inserted targeting the junction of the Right S1 superior articular process and sacral ala junction. Needle was advanced under fluoroscopic guidance. Bone contact was made.Isovue 200 was injected x0.5 mL demonstrating no intravascular uptake. Then a solution containing 2% MPF lidocaine was injected x0.5 mL. Then the Right L5 superior articular process in transverse process junction was targeted. Bone contact was made.Isovue 200 was injected x0.5 mL demonstrating no intravascular uptake. Then a solution containing 2% MPF lidocaine was injected x0.5 mL.  Patient tolerated procedure well. Post procedure instructions were given. Please refer to post procedure form.  Pre injection pain 7/10 Post injection  pain  0/10

## 2021-06-14 NOTE — Progress Notes (Signed)
°  PROCEDURE RECORD [facility]   Name: Sarah Duncan DOB:June 30, 1965 MRN: [id number]  Date:06/14/2021  Physician: Alysia Penna, MD    Nurse/CMA: Jorja Loa MA  Allergies: No Known Allergies  Consent Signed: Yes.    Is patient diabetic? Yes.    CBG today? No checking   Pregnant: No. LMP: No LMP recorded. Patient has had a hysterectomy. (age 56-55)  Anticoagulants: no Anti-inflammatory: no Antibiotics: no  Procedure: Right L4-5 Medial Branch Block  Position: Prone Start Time: 3:33 pm  End Time: 3:41 pm  Fluoro Time: 26  RN/CMA Amiria Orrison MA Dhilan Brauer MA    Time 3:23 pm 3:42 pm    BP 146/88 147/88    Pulse 73 74    Respirations 16 16    O2 Sat 99 98    S/S 6 6    Pain Level 7/10 0/10     D/C home with Wife, patient A & O X 3, D/C instructions reviewed, and sits independently.         Subjective:    Patient ID: Sarah Duncan, female    DOB: 13-May-1966, 56 y.o.   MRN: [id number]  HPI    Review of Systems     Objective:   Physical Exam        Assessment & Plan:

## 2021-06-14 NOTE — Patient Instructions (Addendum)

## 2021-07-28 ENCOUNTER — Encounter
Payer: Medicare (Managed Care) | Attending: Physical Medicine & Rehabilitation | Admitting: Physical Medicine & Rehabilitation

## 2021-07-28 DIAGNOSIS — M47817 Spondylosis without myelopathy or radiculopathy, lumbosacral region: Secondary | ICD-10-CM | POA: Insufficient documentation

## 2021-08-05 ENCOUNTER — Encounter: Payer: Medicare (Managed Care) | Admitting: Physical Medicine & Rehabilitation

## 2021-08-05 ENCOUNTER — Other Ambulatory Visit: Payer: Self-pay

## 2021-08-05 ENCOUNTER — Encounter: Payer: Self-pay | Admitting: Physical Medicine & Rehabilitation

## 2021-08-05 VITALS — BP 121/86 | HR 80 | Ht 67.0 in | Wt 210.0 lb

## 2021-08-05 DIAGNOSIS — M47817 Spondylosis without myelopathy or radiculopathy, lumbosacral region: Secondary | ICD-10-CM | POA: Diagnosis not present

## 2021-08-05 NOTE — Progress Notes (Signed)
? ?Subjective:  ? ? Patient ID: Sarah Duncan, female    DOB: Nov 26, 1965, 56 y.o.   MRN: [id number] ? ?HPI ? ?56 year old female with chronic low back pain.  She underwent L3-4 L4-5 transforaminal fusion performed by Dr. Louanne Skye on 12/10/2018.  No postoperative complications but had persistent pain.  The patient has had pain at the lumbosacral junction.  Provocative maneuvers have demonstrated increased pain with extension of the lumbar spine.  Her pain is worse on the right side than on the left side. ?06/14/2021 right L4 medial branch right L5 dorsal ramus injections performed 2 block pain from the right L5-S1 facet joint complex ?Pre injection pain 7/10 ?Post injection  pain  0/10 which lasted a couple hours ? ?04/22/21 right L4 medial branch right L5 dorsal ramus injections performed 2 block pain from the right L5-S1 facet joint complex ?Pre injection pain 7/10 ?Post injection  pain  2/10 which lasted a couple hours ?We discussed the anatomy of her lumbar spine.  She is fused from L3-L5, the L5-S1 level remains unfused and is the symptomatic level based on examination as well as medial branch blocks. ? ? ?Pain Inventory ?Average Pain 7 ?Pain Right Now 9 ?My pain is constant, sharp, and aching ? ?In the last 24 hours, has pain interfered with the following? ?General activity 7 ?Relation with others 7 ?Enjoyment of life 7 ?What TIME of day is your pain at its worst? daytime and evening ?Sleep (in general) Fair ? ?Pain is worse with: walking, bending, sitting, and standing ?Pain improves with: rest and medication ?Relief from Meds: 6 ? ?Family History  ?Problem Relation Age of Onset  ? Diabetes Mother   ? Hypertension Mother   ? Diabetes Brother   ? Hypertension Brother   ? Cancer Father   ? Endometriosis Daughter   ? Diabetes Daughter   ? HIV Son   ? ?Social History  ? ?Socioeconomic History  ? Marital status: Married  ?  Spouse name: Ronni Rumble  ? Number of children: 4  ? Years of education: associates   ? Highest education level: Not on file  ?Occupational History  ? Occupation: Stay at home spouse  ?  Comment: out of work since 03/2015  ?Tobacco Use  ? Smoking status: Former  ?  Years: 1.00  ?  Types: Cigarettes  ? Smokeless tobacco: Never  ?Vaping Use  ? Vaping Use: Never used  ?Substance and Sexual Activity  ? Alcohol use: Yes  ?  Alcohol/week: 1.0 standard drink  ?  Types: 1 Standard drinks or equivalent per week  ?  Comment: social  ? Drug use: No  ? Sexual activity: Yes  ?  Partners: Female  ?Other Topics Concern  ? Not on file  ?Social History Narrative  ? Divorced. 4 adult children (3 in [place], one in [place]).  ? Re-Married 05/2015. Lives with her wife.  ? Her wife's children live locally.  ? ?Social Determinants of Health  ? ?Financial Resource Strain: Not on file  ?Food Insecurity: Not on file  ?Transportation Needs: Not on file  ?Physical Activity: Not on file  ?Stress: Not on file  ?Social Connections: Not on file  ? ?Past Surgical History:  ?Procedure Laterality Date  ? ABDOMINAL HYSTERECTOMY  2007  ? CESAREAN SECTION  11/19/1989  ? NEPHRECTOMY LIVING DONOR Left   ? donated to her cousin  ? SPINE SURGERY  07/13/2015  ? Lumbar - Dr. Christia Reading  ? TOOTH EXTRACTION  04/16/2017  ?  TUBAL LIGATION  05/09/1993  ? ?Past Surgical History:  ?Procedure Laterality Date  ? ABDOMINAL HYSTERECTOMY  2007  ? CESAREAN SECTION  11/19/1989  ? NEPHRECTOMY LIVING DONOR Left   ? donated to her cousin  ? SPINE SURGERY  07/13/2015  ? Lumbar - Dr. Christia Reading  ? TOOTH EXTRACTION  04/16/2017  ? TUBAL LIGATION  05/09/1993  ? ?Past Medical History:  ?Diagnosis Date  ? Anemia   ? Anxiety   ? Arthritis   ? Constipation   ? Degeneration of cervical intervertebral disc   ? Depression   ? Fibroids   ? Herniated lumbar intervertebral disc 06/18/2015  ? Lactose intolerance   ? Palpitations   ? Shortness of breath   ? 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 ?BP 121/86   Pulse 80   Ht '5\' 7"'$  (1.702 m)   Wt 210 lb (95.3 kg)    SpO2 99%   BMI 32.89 kg/m?  ? ?Opioid Risk Score:   ?Fall Risk Score:  `1 ? ?Depression screen PHQ 2/9 ? ?Depression screen [facility] 2/9 08/05/2021 06/14/2021 03/03/2021 10/08/2017 09/07/2017 06/05/2017 04/17/2017  ?Decreased Interest '1 1 2 '$ 0 '2 1 2  '$ ?Down, Depressed, Hopeless '1 1 2 '$ 0 '2 1 2  '$ ?PHQ - 2 Score '2 2 4 '$ 0 '4 2 4  '$ ?Altered sleeping - - 2 - '3 2 1  '$ ?Tired, decreased energy - - 2 - '2 1 2  '$ ?Change in appetite - - 2 - '2 1 2  '$ ?Feeling bad or failure about yourself  - - 2 - '2 1 2  '$ ?Trouble concentrating - - 1 - '2 1 2  '$ ?Moving slowly or fidgety/restless - - 1 - '3 2 1  '$ ?Suicidal thoughts - - 0 - 0 0 0  ?PHQ-9 Score - - 14 - '18 10 14  '$ ?Difficult doing work/chores - - - - Somewhat difficult Somewhat difficult Somewhat difficult  ?Some recent data might be hidden  ?  ? ?Review of Systems  ?Musculoskeletal:  Positive for back pain.  ?All other systems reviewed and are negative. ? ?   ?Objective:  ? Physical Exam ?Vitals and nursing note reviewed.  ?Constitutional:   ?   Appearance: She is obese.  ?HENT:  ?   Head: Normocephalic and atraumatic.  ?Eyes:  ?   Extraocular Movements: Extraocular movements intact.  ?   Conjunctiva/sclera: Conjunctivae normal.  ?   Pupils: Pupils are equal, round, and reactive to light.  ?Musculoskeletal:  ?   Comments: Pain to palpation lumbar paraspinals right greater than left side ?Pain with extension and limitation to 25% of normal extension.  Patient has some pain with lumbar flexion but not severe, she gets to about 75 % of normal flexion ? ?Negative straight leg raising bilaterally  ?Skin: ?   General: Skin is warm and dry.  ?Neurological:  ?   Mental Status: She is alert and oriented to person, place, and time.  ?   Comments: Motor strength is 5/5 bilateral hip flexor knee extensor ankle dorsiflexors ?Sensation normal in the lower extremities  ?Psychiatric:     ?   Mood and Affect: Mood normal.     ?   Behavior: Behavior normal.  ? ? ? ? ? ?   ?Assessment & Plan:  ?#1.  Lumbar spondylosis with  pain at the unfused L5-S1 facet joint complex mainly on the right side.  She has had an average relief of 85% for the duration  of local anesthetic between the 2 sets medial branch blocks performed on 06/14/2021 as well as 04/22/2021.  We will schedule for right L4 medial branch and right L5 dorsal ramus radiofrequency neurotomy under fluoroscopic guidance.  We discussed the average duration of response to radiofrequency neurotomy which is in the 6 to 37-monthrange. ?Discussed with patient agrees with plan ? ?

## 2021-09-23 ENCOUNTER — Encounter: Payer: Self-pay | Admitting: Physical Medicine & Rehabilitation

## 2021-09-23 ENCOUNTER — Encounter
Payer: Medicare (Managed Care) | Attending: Physical Medicine & Rehabilitation | Admitting: Physical Medicine & Rehabilitation

## 2021-09-23 VITALS — BP 134/88 | HR 74 | Ht 67.0 in | Wt 206.0 lb

## 2021-09-23 DIAGNOSIS — M47817 Spondylosis without myelopathy or radiculopathy, lumbosacral region: Secondary | ICD-10-CM | POA: Diagnosis present

## 2021-09-23 NOTE — Progress Notes (Signed)
?  PROCEDURE RECORD ?[facility] ? ? ?Name: Sarah Duncan ?DOB:07/05/1965 ?MRN: [id number] ? ?Date:09/23/2021  Physician: Alysia Penna, MD   ? ?Nurse/CMA: Ronnald Ramp, RMA ? ?Allergies: No Known Allergies ? ?Consent Signed: Yes.    Is patient diabetic? Yes.    CBG today? 70 ? ?Pregnant: No. LMP: No LMP recorded. Patient has had a hysterectomy. (age 54-55) ? ?Anticoagulants: no ?Anti-inflammatory: no ?Antibiotics: no ? ?Procedure: Right L4 medial branch, L5 dorsal ramus ablation  Position: Prone ?Start Time: 10:06 am  End Time: 10:30 am  Fluoro Time: 1:15 ? ?RN/CMA Ronnald Ramp, RMA Ronnald Ramp, RMA    ?Time 9:48  am 10:37    ?BP 126/85 134/88    ?Pulse 77 76    ?Respirations 16 18    ?O2 Sat 95 95    ?S/S 6 6    ?Pain Level 7/10 0/10    ? ?D/C home with wife, patient A & O X 3, D/C instructions reviewed, and sits independently. ? ? ? ? ? ? ? ?

## 2021-09-23 NOTE — Patient Instructions (Signed)
You had a radio frequency procedure today This was done to alleviate joint pain in your lumbar area We injected lidocaine which is a local anesthetic.  You may experience soreness at the injection sites. You may also experienced some irritation of the nerves that were heated I'm recommending ice for 30 minutes every 2 hours as needed for the next 24-48 hours   

## 2021-09-23 NOTE — Progress Notes (Signed)
RightL5 dorsal ramus., Right L4  medial branch radio frequency neurotomy under fluoroscopic guidance  ? ?Indication: Low back pain due to lumbar spondylosis which has been relieved on 2 occasions by greater than 50% by lumbar medial branch blocks at corresponding levels. ? ?Informed consent was obtained after describing risks and benefits of the procedure with the patient, this includes bleeding, bruising, infection, paralysis and medication side effects. The patient wishes to proceed and has given written consent. The patient was placed in a prone position. The lumbar and sacral area was marked and prepped with Betadine. A 25-gauge 1-1/2 inch needle was inserted into the skin and subcutaneous tissue at 3 sites in one ML of 1% lidocaine was injected into each site. Then a 18-gauge 10 cm radio frequency needle with a 1 cm curved active tip was inserted targeting the Right S1 SAP/sacral ala junction. Bone contact was made and confirmed with lateral imaging.  motor stimulation at 2 Hz confirm proper needle location followed by injection of 53m 2% MPF lidocaine. Then the Right L5 SAP/transverse process junction was targeted. Bone contact was made and confirmed with lateral imaging.  motor stimulation at 2 Hz confirm proper needle location followed by injection of 174m2% MPF lidocaine. Radio frequency lesion being at 8082 for 90 seconds was performed. Needles were removed. Post procedure instructions and vital signs were performed. Patient tolerated procedure well. Followup appointment was given.  ?

## 2021-10-19 ENCOUNTER — Encounter: Payer: Self-pay | Admitting: Physical Medicine & Rehabilitation

## 2021-11-02 ENCOUNTER — Ambulatory Visit: Payer: Self-pay

## 2021-11-02 ENCOUNTER — Ambulatory Visit (INDEPENDENT_AMBULATORY_CARE_PROVIDER_SITE_OTHER): Payer: Medicare (Managed Care) | Admitting: Specialist

## 2021-11-02 ENCOUNTER — Encounter: Payer: Self-pay | Admitting: Specialist

## 2021-11-02 VITALS — BP 125/77 | HR 71 | Ht 67.0 in | Wt 197.8 lb

## 2021-11-02 DIAGNOSIS — M48062 Spinal stenosis, lumbar region with neurogenic claudication: Secondary | ICD-10-CM

## 2021-11-02 DIAGNOSIS — M5136 Other intervertebral disc degeneration, lumbar region: Secondary | ICD-10-CM | POA: Diagnosis not present

## 2021-11-02 DIAGNOSIS — Z981 Arthrodesis status: Secondary | ICD-10-CM

## 2021-11-02 DIAGNOSIS — M545 Low back pain, unspecified: Secondary | ICD-10-CM

## 2021-11-02 DIAGNOSIS — G8929 Other chronic pain: Secondary | ICD-10-CM

## 2021-11-02 MED ORDER — DIAZEPAM 5 MG PO TABS: ORAL_TABLET | ORAL | 0 refills | Status: DC

## 2021-11-02 MED ORDER — TRAMADOL HCL 50 MG PO TABS: 50.0000 mg | ORAL_TABLET | Freq: Four times a day (QID) | ORAL | 0 refills | Status: DC | PRN

## 2021-11-02 MED ORDER — GABAPENTIN 100 MG PO CAPS: 100.0000 mg | ORAL_CAPSULE | Freq: Every day | ORAL | 2 refills | Status: DC

## 2021-11-02 NOTE — Progress Notes (Signed)
Office Visit Note   [name]           Date of Birth: 09-14-1965           MRN: [id number] Visit Date: 11/02/2021              Requested by: Harrison Mons, [address] [place],  [place] [id number] PCP: Harrison Mons, PA   Assessment & Plan: Visit Diagnoses:  1. Chronic midline low back pain without sciatica   2. Spinal stenosis of lumbar region with neurogenic claudication   3. Degenerative disc disease, lumbar   4. S/P lumbar spinal fusion     Plan: Avoid frequent bending and stooping  No lifting greater than 10 lbs. May use ice or moist heat for pain. Weight loss is of benefit. Best medication for lumbar disc disease is arthritis medications like motrin, celebrex and naprosyn but you only have one kidney so don't use arthritis meds like these. Exercise is important to improve your indurance and does allow people to function better inspite of back pain.  Ice. Hot showers in the AM.  Hemp CBD capsules, amazon.com 5,000-7,000 mg per bottle, 60 capsules per bottle, take one capsule twice a day.  Follow-Up Instructions: No follow-ups on file.    Follow-Up Instructions: No follow-ups on file.   Orders:  Orders Placed This Encounter  Procedures   XR Lumbar Spine 2-3 Views   No orders of the defined types were placed in this encounter.     Procedures: No procedures performed   Clinical Data: No additional findings.   Subjective: Chief Complaint  Patient presents with   Lower Back - Pain    56 year old female with history of L3-4 and L4-5 TLIFs in 2020 now with recurring pain into the back transverse and It is worse with bending and stooping and lifting and with twist like vacuuming and sweeping and also with riding in car and  She also has pain with walking. RFA and joint block did not give substantial relief. Pain is constant 6-7 and as bad as a 10.   Review of Systems  Constitutional: Negative.   HENT:  Negative.    Eyes: Negative.   Respiratory: Negative.    Cardiovascular: Negative.   Gastrointestinal: Negative.   Endocrine: Negative.   Genitourinary: Negative.   Musculoskeletal: Negative.   Skin: Negative.   Allergic/Immunologic: Negative.   Neurological: Negative.   Hematological: Negative.   Psychiatric/Behavioral: Negative.       Objective: Vital Signs: BP 125/77 (BP Location: Left Arm, Patient Position: Sitting, Cuff Size: Large)   Pulse 71   Ht '5\' 7"'$  (1.702 m)   Wt 197 lb 12.8 oz (89.7 kg)   BMI 30.98 kg/m   Physical Exam Constitutional:      Appearance: She is well-developed.  HENT:     Head: Normocephalic and atraumatic.  Eyes:     Pupils: Pupils are equal, round, and reactive to light.  Pulmonary:     Effort: Pulmonary effort is normal.     Breath sounds: Normal breath sounds.  Abdominal:     General: Bowel sounds are normal.     Palpations: Abdomen is soft.  Musculoskeletal:     Cervical back: Normal range of motion and neck supple.     Lumbar back: Negative right straight leg raise test.  Skin:    General: Skin is warm and dry.  Neurological:     Mental Status: She is alert and  oriented to person, place, and time.  Psychiatric:        Behavior: Behavior normal.        Thought Content: Thought content normal.        Judgment: Judgment normal.    Back Exam   Tenderness  The patient is experiencing tenderness in the lumbar.  Range of Motion  Extension:  abnormal  Flexion:  abnormal  Lateral bend right:  abnormal  Lateral bend left:  abnormal  Rotation right:  abnormal  Rotation left:  abnormal   Muscle Strength  Right Quadriceps:  5/5  Right Hamstrings:  5/5  Left Hamstrings:  5/5   Tests  Straight leg raise right: negative  Reflexes  Patellar:  2/4 Achilles:  2/4  Other  Toe walk: normal Heel walk: normal     Specialty Comments:  No specialty comments available.  Imaging: No results found.   PMFS History: Patient  Active Problem List   Diagnosis Date Noted   Degenerative disc disease, lumbar 12/10/2018    Priority: High    Class: Chronic   Spinal stenosis of lumbar region 12/10/2018    Priority: High    Class: Chronic   Seizure ([place]) 12/28/2020   Seizures ([place]) 12/27/2020   Right leg pain 07/16/2019   S/P lumbar spinal fusion 12/10/2018   Surgery, elective    Generalized anxiety disorder 09/07/2017   Essential hypertension 07/31/2017   Other hyperlipidemia 03/06/2017   Class 2 obesity with serious comorbidity and body mass index (BMI) of 35.0 to 35.9 in adult 01/08/2017   Other chronic pain 12/04/2016   Prediabetes 10/18/2016   Vitamin D deficiency 10/18/2016   Depression 11/23/2015   Single kidney 10/12/2015   Chronic lower back pain 06/18/2015   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 Christia Reading   TOOTH EXTRACTION  04/16/2017   TUBAL LIGATION  05/09/1993   Social History   Occupational History   Occupation: Stay at home spouse    Comment: out of work since 03/2015  Tobacco Use   Smoking status: Former    Years: 1.00    Types: Cigarettes   Smokeless tobacco: Never  Vaping Use   Vaping Use: Never used  Substance and Sexual Activity   Alcohol use: Yes     Alcohol/week: 1.0 standard drink of alcohol    Types: 1 Standard drinks or equivalent per week    Comment: social   Drug use: No   Sexual activity: Yes    Partners: Female

## 2021-11-02 NOTE — Patient Instructions (Signed)
  Plan: Avoid frequent bending and stooping  No lifting greater than 10 lbs. May use ice or moist heat for pain. Weight loss is of benefit. Best medication for lumbar disc disease is arthritis medications like motrin, celebrex and naprosyn but you only have one kidney so don't use arthritis meds like these. Exercise is important to improve your indurance and does allow people to function better inspite of back pain.  Ice. Hot showers in the AM.  Hemp CBD capsules, amazon.com 5,000-7,000 mg per bottle, 60 capsules per bottle, take one capsule twice a day.  Follow-Up Instructions: No follow-ups on file.

## 2021-11-04 ENCOUNTER — Ambulatory Visit
Admission: RE | Admit: 2021-11-04 | Discharge: 2021-11-04 | Disposition: A | Discharge status: Home / Self Care | Payer: Medicare (Managed Care) | Source: Ambulatory Visit | Attending: Specialist | Admitting: Specialist

## 2021-11-04 DIAGNOSIS — M5136 Other intervertebral disc degeneration, lumbar region: Secondary | ICD-10-CM

## 2021-11-04 DIAGNOSIS — G8929 Other chronic pain: Secondary | ICD-10-CM

## 2021-11-04 DIAGNOSIS — Z981 Arthrodesis status: Secondary | ICD-10-CM

## 2021-11-04 MED ORDER — GADOBENATE DIMEGLUMINE 529 MG/ML IV SOLN
18.0000 mL | Freq: Once | INTRAVENOUS | Status: AC | PRN
  Administered 2021-11-04: 18 mL via INTRAVENOUS

## 2021-11-16 ENCOUNTER — Ambulatory Visit (INDEPENDENT_AMBULATORY_CARE_PROVIDER_SITE_OTHER): Payer: Medicare (Managed Care) | Admitting: Specialist

## 2021-11-16 ENCOUNTER — Encounter: Payer: Self-pay | Admitting: Specialist

## 2021-11-16 VITALS — BP 99/68 | HR 84 | Ht 67.0 in | Wt 198.0 lb

## 2021-11-16 DIAGNOSIS — M5136 Other intervertebral disc degeneration, lumbar region: Secondary | ICD-10-CM

## 2021-11-16 DIAGNOSIS — R292 Abnormal reflex: Secondary | ICD-10-CM

## 2021-11-16 DIAGNOSIS — M48062 Spinal stenosis, lumbar region with neurogenic claudication: Secondary | ICD-10-CM

## 2021-11-16 DIAGNOSIS — Z981 Arthrodesis status: Secondary | ICD-10-CM

## 2021-11-16 NOTE — Progress Notes (Signed)
Office Visit Note   [name]           Date of Birth: 1965/08/22           MRN: [id number] Visit Date: 11/16/2021              Requested by: Harrison Mons, [address],  [place] [address] PCP: Harrison Mons, PA   Assessment & Plan: Visit Diagnoses:  1. Spinal stenosis of lumbar region with neurogenic claudication   2. Degenerative disc disease, lumbar   3. S/P lumbar spinal fusion   4. Hyperreflexia of lower extremity   5. Hoffman's reflex positive     Plan: Avoid frequent bending and stooping  No lifting greater than 10 lbs. May use ice or moist heat for pain. Weight loss is of benefit. Best medication for lumbar disc disease is arthritis medications like motrin, celebrex and naprosyn. Exercise is important to improve your indurance and does allow people to function better inspite of back pain. Referral to neurology due to hyper reflexia, Hoffman's sign positive and history of a Spontaneous seizure following use of cannibus, there is calcification of the meninges over the interhemisphere vertex of the frontal area of the brain, AVN is within the differential. Follow-Up Instructions: No follow-ups on file.   Orders:  No orders of the defined types were placed in this encounter.  No orders of the defined types were placed in this encounter.     Procedures: No procedures performed   Clinical Data: No additional findings.   Subjective: Chief Complaint  Patient presents with   Lower Back - Follow-up    MRI Review     HPI  Review of Systems   Objective: Vital Signs: BP 99/68 (BP Location: Left Arm, Patient Position: Sitting)   Pulse 84   Ht '5\' 7"'$  (1.702 m)   Wt 198 lb (89.8 kg)   BMI 31.01 kg/m   Physical Exam  Ortho Exam  Specialty Comments:  No specialty comments available.  Imaging: No results found.   PMFS History: Patient Active Problem List   Diagnosis Date Noted    Degenerative disc disease, lumbar 12/10/2018    Priority: High    Class: Chronic   Spinal stenosis of lumbar region 12/10/2018    Priority: High    Class: Chronic   Seizure ([place]) 12/28/2020   Seizures ([place]) 12/27/2020   Right leg pain 07/16/2019   S/P lumbar spinal fusion 12/10/2018   Surgery, elective    Generalized anxiety disorder 09/07/2017   Essential hypertension 07/31/2017   Other hyperlipidemia 03/06/2017   Class 2 obesity with serious comorbidity and body mass index (BMI) of 35.0 to 35.9 in adult 01/08/2017   Other chronic pain 12/04/2016   Prediabetes 10/18/2016   Vitamin D deficiency 10/18/2016   Depression 11/23/2015   Single kidney 10/12/2015   Chronic lower back pain 06/18/2015   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 Christia Reading   TOOTH EXTRACTION  04/16/2017   TUBAL LIGATION  05/09/1993   Social History   Occupational History   Occupation: Stay at home spouse    Comment: out of work since 03/2015  Tobacco Use   Smoking status: Former    Years: 1.00    Types: Cigarettes   Smokeless tobacco: Never  Vaping Use   Vaping Use: Never used  Substance and Sexual Activity   Alcohol use: Yes    Alcohol/week: 1.0 standard drink of alcohol     Types: 1 Standard drinks or equivalent per week    Comment: social   Drug use: No   Sexual activity: Yes    Partners: Female

## 2021-11-16 NOTE — Patient Instructions (Signed)
Avoid frequent bending and stooping  No lifting greater than 10 lbs. May use ice or moist heat for pain. Weight loss is of benefit. Best medication for lumbar disc disease is arthritis medications but you can not take arthritis medication. CBD is a consideration but with your history of seizure with cannbis intake this may preclude the use of the CBD, there is no regulation of this product by the FDA or DEA, it is extremely low amount of cannibis though. Tylenol for pain.  Referral to neurology due to hyper reflexia, Hoffman's sign positive and history of a Spontaneous seizure following use of cannibus, there is calcification of the meninges over the interhemisphere vertex of the frontal area of the brain, AVN is within the differential. Exercise is important to improve your indurance and does allow people to function better inspite of back pain.

## 2021-12-05 ENCOUNTER — Ambulatory Visit: Payer: Medicare (Managed Care) | Admitting: Specialist

## 2022-01-18 NOTE — H&P (Signed)
Subjective:     Patient ID: Sarah Duncan is a 56 y.o. female.   HPI   Returns for follow up discussion prior to planned panniculetcomy. Highest weight 237 lb. Notes 10 lb wt loss with stopping steroids used for back pain. On Ozempic for 6 months. Reports stable at current weight which is her goal for 4 months. Reports over year history sores beneath panniculus. Has tried topical antifungal powders, hygiene measures, antibiotic ointments, towels for over 6 month trial without change. Reports sores occur 2-3 times per month.    PMH significant for donor nephrectomy. Per chart review experienced seizure 12/2020, patient states after use of edible they belive was laces with fentanyl.    Disabled secondary to back pain post fusion. Previously drove trucks. Lives with wife and has custody of 17 yo niece.   Review of Systems Remainder 12 point review negative    Objective:   Physical Exam Cardiovascular:     Rate and Rhythm: Normal rate and regular rhythm.     Heart sounds: Normal heart sounds.  Pulmonary:     Effort: Pulmonary effort is normal.     Breath sounds: Normal breath sounds.   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 Panniculectomy is expected to improve the interference with ADLs.    Reviewed panniculectomy vs abdominoplasty. Reviewed this is not a cosmetic procedure. Reviewed changes with aging, wt gain/loss, will not have as taught skin given significant loss elasticity. Reviewed drains, post op limitations. Reviewed scar maturation over months. Reviewed expected area scars area soft tissue resection  and expected elevation mons. Counseled will not change contour mons, will not affect back or thighs, upper abdomen, or chest soft tissue rolls. Reviewed diastasis repair a part of cosmetic abdominoplasty and will not be performed.   Additional risks including but not limited to bleeding seroma hematoma infection wound healing problems need for additional procedures unacceptable cosmetic result asymmetry blood clots in legs or lungs damage to adjacent structures reviewed.   Plan overnight stay. Drain teaching completed. Rx for oxycodone given. Unable to take NSAIDs due to donor nephrectomy.

## 2022-01-24 ENCOUNTER — Encounter (HOSPITAL_BASED_OUTPATIENT_CLINIC_OR_DEPARTMENT_OTHER)
Admission: RE | Admit: 2022-01-24 | Discharge: 2022-01-24 | Disposition: A | Discharge status: Home / Self Care | Payer: Medicare (Managed Care) | Source: Ambulatory Visit | Attending: Plastic Surgery | Admitting: Plastic Surgery

## 2022-01-24 DIAGNOSIS — Z01812 Encounter for preprocedural laboratory examination: Secondary | ICD-10-CM | POA: Insufficient documentation

## 2022-01-24 LAB — BASIC METABOLIC PANEL
Anion gap: 7 (ref 5–15)
BUN: 9 mg/dL (ref 6–20)
CO2: 25 mmol/L (ref 22–32)
Calcium: 9.8 mg/dL (ref 8.9–10.3)
Chloride: 109 mmol/L (ref 98–111)
Creatinine, Ser: 1.22 mg/dL — ABNORMAL HIGH (ref 0.44–1.00)
GFR, Estimated: 53 mL/min — ABNORMAL LOW (ref >60)
Glucose, Bld: 89 mg/dL (ref 70–99)
Potassium: 5.1 mmol/L (ref 3.5–5.1)
Sodium: 141 mmol/L (ref 135–145)

## 2022-01-24 NOTE — Progress Notes (Signed)

## 2022-01-27 ENCOUNTER — Ambulatory Visit (HOSPITAL_BASED_OUTPATIENT_CLINIC_OR_DEPARTMENT_OTHER): Payer: Medicare (Managed Care) | Admitting: Anesthesiology

## 2022-01-27 ENCOUNTER — Encounter (HOSPITAL_BASED_OUTPATIENT_CLINIC_OR_DEPARTMENT_OTHER)
Admission: RE | Disposition: A | Discharge status: Home / Self Care | Payer: Self-pay | Source: Home / Self Care | Attending: Plastic Surgery

## 2022-01-27 ENCOUNTER — Encounter (HOSPITAL_BASED_OUTPATIENT_CLINIC_OR_DEPARTMENT_OTHER): Payer: Self-pay | Admitting: Plastic Surgery

## 2022-01-27 ENCOUNTER — Ambulatory Visit (HOSPITAL_BASED_OUTPATIENT_CLINIC_OR_DEPARTMENT_OTHER)
Admission: RE | Admit: 2022-01-27 | Discharge: 2022-01-28 | Disposition: A | Discharge status: Home / Self Care | Payer: Medicare (Managed Care) | Attending: Plastic Surgery | Admitting: Plastic Surgery

## 2022-01-27 ENCOUNTER — Other Ambulatory Visit: Payer: Self-pay

## 2022-01-27 DIAGNOSIS — R569 Unspecified convulsions: Secondary | ICD-10-CM | POA: Insufficient documentation

## 2022-01-27 DIAGNOSIS — M793 Panniculitis, unspecified: Secondary | ICD-10-CM

## 2022-01-27 DIAGNOSIS — Z87891 Personal history of nicotine dependence: Secondary | ICD-10-CM | POA: Diagnosis not present

## 2022-01-27 DIAGNOSIS — Z01818 Encounter for other preprocedural examination: Secondary | ICD-10-CM

## 2022-01-27 DIAGNOSIS — I10 Essential (primary) hypertension: Secondary | ICD-10-CM | POA: Diagnosis not present

## 2022-01-27 HISTORY — PX: PANNICULECTOMY: SHX5360

## 2022-01-27 HISTORY — DX: Type 2 diabetes mellitus without complications: E11.9

## 2022-01-27 LAB — GLUCOSE, CAPILLARY
Glucose-Capillary: 76 mg/dL (ref 70–99)
Glucose-Capillary: 101 mg/dL — ABNORMAL HIGH (ref 70–99)

## 2022-01-27 SURGERY — PANNICULECTOMY
Anesthesia: General | Site: Abdomen

## 2022-01-27 MED ORDER — OXYCODONE HCL 5 MG/5ML PO SOLN: 5.0000 mg | Freq: Once | ORAL | Status: DC | PRN

## 2022-01-27 MED ORDER — FENTANYL CITRATE (PF) 100 MCG/2ML IJ SOLN
INTRAMUSCULAR | Status: DC | PRN
  Administered 2022-01-27 (×2): 25 ug via INTRAVENOUS
  Administered 2022-01-27: 100 ug via INTRAVENOUS
  Administered 2022-01-27: 25 ug via INTRAVENOUS
  Administered 2022-01-27: 100 ug via INTRAVENOUS

## 2022-01-27 MED ORDER — GABAPENTIN 300 MG PO CAPS
300.0000 mg | ORAL_CAPSULE | ORAL | Status: AC
  Administered 2022-01-27: 300 mg via ORAL

## 2022-01-27 MED ORDER — ONDANSETRON 4 MG PO TBDP: 4.0000 mg | ORAL_TABLET | Freq: Four times a day (QID) | ORAL | Status: DC | PRN

## 2022-01-27 MED ORDER — ONDANSETRON HCL 4 MG/2ML IJ SOLN: 4.0000 mg | Freq: Four times a day (QID) | INTRAMUSCULAR | Status: DC | PRN

## 2022-01-27 MED ORDER — HEPARIN SODIUM (PORCINE) 5000 UNIT/ML IJ SOLN
5000.0000 [IU] | Freq: Once | INTRAMUSCULAR | Status: AC
  Administered 2022-01-27: 5000 [IU] via SUBCUTANEOUS

## 2022-01-27 MED ORDER — HYDROMORPHONE HCL 1 MG/ML IJ SOLN
0.5000 mg | INTRAMUSCULAR | Status: DC | PRN
  Administered 2022-01-27: 0.5 mg via INTRAVENOUS
  Filled 2022-01-27: qty 0.5

## 2022-01-27 MED ORDER — FENTANYL CITRATE (PF) 100 MCG/2ML IJ SOLN
INTRAMUSCULAR | Status: AC
  Filled 2022-01-27: qty 2

## 2022-01-27 MED ORDER — DULOXETINE HCL 60 MG PO CPEP
60.0000 mg | ORAL_CAPSULE | Freq: Every day | ORAL | Status: DC
  Administered 2022-01-27: 60 mg via ORAL
  Filled 2022-01-27: qty 1

## 2022-01-27 MED ORDER — CEFAZOLIN SODIUM-DEXTROSE 2-4 GM/100ML-% IV SOLN
INTRAVENOUS | Status: AC
  Filled 2022-01-27: qty 100

## 2022-01-27 MED ORDER — ATROPINE SULFATE 0.4 MG/ML IV SOLN
INTRAVENOUS | Status: AC
  Filled 2022-01-27: qty 1

## 2022-01-27 MED ORDER — HYDROMORPHONE HCL 1 MG/ML IJ SOLN
INTRAMUSCULAR | Status: AC
  Filled 2022-01-27: qty 1

## 2022-01-27 MED ORDER — LIDOCAINE 2% (20 MG/ML) 5 ML SYRINGE
INTRAMUSCULAR | Status: AC
  Filled 2022-01-27: qty 5

## 2022-01-27 MED ORDER — LACTATED RINGERS IV SOLN: INTRAVENOUS | Status: DC

## 2022-01-27 MED ORDER — FENTANYL CITRATE (PF) 100 MCG/2ML IJ SOLN
25.0000 ug | INTRAMUSCULAR | Status: DC | PRN
  Administered 2022-01-27 (×3): 50 ug via INTRAVENOUS

## 2022-01-27 MED ORDER — ACETAMINOPHEN 500 MG PO TABS
ORAL_TABLET | ORAL | Status: AC
  Filled 2022-01-27: qty 2

## 2022-01-27 MED ORDER — MIDAZOLAM HCL 5 MG/5ML IJ SOLN
INTRAMUSCULAR | Status: DC | PRN
  Administered 2022-01-27: 2 mg via INTRAVENOUS

## 2022-01-27 MED ORDER — SUGAMMADEX SODIUM 500 MG/5ML IV SOLN
INTRAVENOUS | Status: AC
  Filled 2022-01-27: qty 5

## 2022-01-27 MED ORDER — MIDAZOLAM HCL 2 MG/2ML IJ SOLN
INTRAMUSCULAR | Status: AC
  Filled 2022-01-27: qty 2

## 2022-01-27 MED ORDER — PHENYLEPHRINE 80 MCG/ML (10ML) SYRINGE FOR IV PUSH (FOR BLOOD PRESSURE SUPPORT)
PREFILLED_SYRINGE | INTRAVENOUS | Status: AC
  Filled 2022-01-27: qty 10

## 2022-01-27 MED ORDER — PROPOFOL 10 MG/ML IV BOLUS
INTRAVENOUS | Status: DC | PRN
  Administered 2022-01-27: 160 mg via INTRAVENOUS

## 2022-01-27 MED ORDER — ENOXAPARIN SODIUM 40 MG/0.4ML IJ SOSY
40.0000 mg | PREFILLED_SYRINGE | INTRAMUSCULAR | Status: DC
  Administered 2022-01-28: 40 mg via SUBCUTANEOUS
  Filled 2022-01-27: qty 0.4

## 2022-01-27 MED ORDER — CHLORHEXIDINE GLUCONATE CLOTH 2 % EX PADS: 6.0000 | MEDICATED_PAD | Freq: Once | CUTANEOUS | Status: DC

## 2022-01-27 MED ORDER — LIDOCAINE HCL (CARDIAC) PF 100 MG/5ML IV SOSY
PREFILLED_SYRINGE | INTRAVENOUS | Status: DC | PRN
  Administered 2022-01-27: 60 mg via INTRAVENOUS

## 2022-01-27 MED ORDER — HYDROMORPHONE HCL 1 MG/ML IJ SOLN
INTRAMUSCULAR | Status: DC | PRN
  Administered 2022-01-27: 1 mg via INTRAVENOUS

## 2022-01-27 MED ORDER — SUGAMMADEX SODIUM 200 MG/2ML IV SOLN
INTRAVENOUS | Status: DC | PRN
  Administered 2022-01-27: 200 mg via INTRAVENOUS

## 2022-01-27 MED ORDER — OXYCODONE HCL 5 MG PO TABS: 5.0000 mg | ORAL_TABLET | Freq: Once | ORAL | Status: DC | PRN

## 2022-01-27 MED ORDER — OXYCODONE HCL 5 MG PO TABS
5.0000 mg | ORAL_TABLET | ORAL | Status: DC | PRN
  Administered 2022-01-27 – 2022-01-28 (×4): 10 mg via ORAL
  Filled 2022-01-27 (×4): qty 2

## 2022-01-27 MED ORDER — EPHEDRINE 5 MG/ML INJ
INTRAVENOUS | Status: AC
  Filled 2022-01-27: qty 5

## 2022-01-27 MED ORDER — ROCURONIUM BROMIDE 10 MG/ML (PF) SYRINGE
PREFILLED_SYRINGE | INTRAVENOUS | Status: AC
  Filled 2022-01-27: qty 10

## 2022-01-27 MED ORDER — 0.9 % SODIUM CHLORIDE (POUR BTL) OPTIME
TOPICAL | Status: DC | PRN
  Administered 2022-01-27: 140 mL

## 2022-01-27 MED ORDER — KCL IN DEXTROSE-NACL 20-5-0.45 MEQ/L-%-% IV SOLN
INTRAVENOUS | Status: DC
  Filled 2022-01-27: qty 1000

## 2022-01-27 MED ORDER — SUCCINYLCHOLINE CHLORIDE 200 MG/10ML IV SOSY
PREFILLED_SYRINGE | INTRAVENOUS | Status: AC
  Filled 2022-01-27: qty 10

## 2022-01-27 MED ORDER — ROCURONIUM BROMIDE 100 MG/10ML IV SOLN
INTRAVENOUS | Status: DC | PRN
  Administered 2022-01-27: 50 mg via INTRAVENOUS

## 2022-01-27 MED ORDER — BUSPIRONE HCL 15 MG PO TABS
15.0000 mg | ORAL_TABLET | Freq: Two times a day (BID) | ORAL | Status: DC
  Administered 2022-01-27: 15 mg via ORAL
  Filled 2022-01-27 (×2): qty 1

## 2022-01-27 MED ORDER — GABAPENTIN 300 MG PO CAPS
ORAL_CAPSULE | ORAL | Status: AC
  Filled 2022-01-27: qty 1

## 2022-01-27 MED ORDER — BUPIVACAINE HCL (PF) 0.5 % IJ SOLN
INTRAMUSCULAR | Status: DC | PRN
  Administered 2022-01-27: 30 mL

## 2022-01-27 MED ORDER — METHOCARBAMOL 500 MG PO TABS
500.0000 mg | ORAL_TABLET | Freq: Four times a day (QID) | ORAL | Status: DC | PRN
  Administered 2022-01-27 – 2022-01-28 (×3): 500 mg via ORAL
  Filled 2022-01-27 (×3): qty 1

## 2022-01-27 MED ORDER — SUCCINYLCHOLINE CHLORIDE 200 MG/10ML IV SOSY
PREFILLED_SYRINGE | INTRAVENOUS | Status: DC | PRN
  Administered 2022-01-27: 120 mg via INTRAVENOUS

## 2022-01-27 MED ORDER — ONDANSETRON HCL 4 MG/2ML IJ SOLN
INTRAMUSCULAR | Status: AC
  Filled 2022-01-27: qty 2

## 2022-01-27 MED ORDER — HEPARIN SODIUM (PORCINE) 5000 UNIT/ML IJ SOLN
INTRAMUSCULAR | Status: AC
  Filled 2022-01-27: qty 1

## 2022-01-27 MED ORDER — ACETAMINOPHEN 500 MG PO TABS
1000.0000 mg | ORAL_TABLET | ORAL | Status: AC
  Administered 2022-01-27: 1000 mg via ORAL

## 2022-01-27 MED ORDER — CEFAZOLIN SODIUM-DEXTROSE 2-4 GM/100ML-% IV SOLN
2.0000 g | INTRAVENOUS | Status: AC
  Administered 2022-01-27: 2 g via INTRAVENOUS

## 2022-01-27 MED ORDER — DEXAMETHASONE SODIUM PHOSPHATE 10 MG/ML IJ SOLN
INTRAMUSCULAR | Status: AC
  Filled 2022-01-27: qty 1

## 2022-01-27 MED ORDER — ONDANSETRON HCL 4 MG/2ML IJ SOLN
INTRAMUSCULAR | Status: DC | PRN
  Administered 2022-01-27: 4 mg via INTRAVENOUS

## 2022-01-27 MED ORDER — DEXAMETHASONE SODIUM PHOSPHATE 4 MG/ML IJ SOLN
INTRAMUSCULAR | Status: DC | PRN
  Administered 2022-01-27: 5 mg via INTRAVENOUS

## 2022-01-27 SURGICAL SUPPLY — 58 items
ADH SKN CLS APL DERMABOND .7 (GAUZE/BANDAGES/DRESSINGS) ×2
APL PRP STRL LF DISP 70% ISPRP (MISCELLANEOUS) ×1
APPLIER CLIP 9.375 MED OPEN (MISCELLANEOUS) ×1
APR CLP MED 9.3 20 MLT OPN (MISCELLANEOUS) ×1
BINDER ABDOMINAL 10 UNV 27-48 (MISCELLANEOUS) IMPLANT
BINDER ABDOMINAL 12 SM 30-45 (SOFTGOODS) IMPLANT
BLADE CLIPPER SURG (BLADE) IMPLANT
BLADE SURG 10 STRL SS (BLADE) ×2 IMPLANT
BLADE SURG 11 STRL SS (BLADE) ×1 IMPLANT
BLADE SURG 15 STRL LF DISP TIS (BLADE) IMPLANT
BLADE SURG 15 STRL SS (BLADE) ×1
CANISTER SUCT 1200ML W/VALVE (MISCELLANEOUS) ×1 IMPLANT
CHLORAPREP W/TINT 26 (MISCELLANEOUS) ×1 IMPLANT
CLIP APPLIE 9.375 MED OPEN (MISCELLANEOUS) ×1 IMPLANT
COVER BACK TABLE 60X90IN (DRAPES) ×1 IMPLANT
COVER MAYO STAND STRL (DRAPES) ×1 IMPLANT
DERMABOND ADVANCED (GAUZE/BANDAGES/DRESSINGS) ×2
DERMABOND ADVANCED .7 DNX12 (GAUZE/BANDAGES/DRESSINGS) ×2 IMPLANT
DRAIN CHANNEL 15F RND FF W/TCR (WOUND CARE) ×2 IMPLANT
DRAIN CHANNEL 19F RND (DRAIN) IMPLANT
DRAPE TOP ARMCOVERS (MISCELLANEOUS) ×1 IMPLANT
DRAPE U-SHAPE 76X120 STRL (DRAPES) ×1 IMPLANT
DRAPE UTILITY XL STRL (DRAPES) ×1 IMPLANT
ELECT BLADE 4.0 EZ CLEAN MEGAD (MISCELLANEOUS)
ELECT COATED BLADE 2.86 ST (ELECTRODE) IMPLANT
ELECT REM PT RETURN 9FT ADLT (ELECTROSURGICAL) ×1
ELECTRODE BLDE 4.0 EZ CLN MEGD (MISCELLANEOUS) IMPLANT
ELECTRODE REM PT RTRN 9FT ADLT (ELECTROSURGICAL) ×1 IMPLANT
EVACUATOR SILICONE 100CC (DRAIN) ×2 IMPLANT
GAUZE PAD ABD 8X10 STRL (GAUZE/BANDAGES/DRESSINGS) ×2 IMPLANT
GAUZE XEROFORM 1X8 LF (GAUZE/BANDAGES/DRESSINGS) IMPLANT
GLOVE BIO SURGEON STRL SZ 6 (GLOVE) ×3 IMPLANT
GOWN STRL REUS W/ TWL LRG LVL3 (GOWN DISPOSABLE) ×2 IMPLANT
GOWN STRL REUS W/TWL LRG LVL3 (GOWN DISPOSABLE) ×2
HEMOSTAT ARISTA ABSORB 3G PWDR (HEMOSTASIS) IMPLANT
NDL HYPO 25X1 1.5 SAFETY (NEEDLE) IMPLANT
NEEDLE HYPO 25X1 1.5 SAFETY (NEEDLE) IMPLANT
NS IRRIG 1000ML POUR BTL (IV SOLUTION) ×1 IMPLANT
PACK BASIN DAY SURGERY FS (CUSTOM PROCEDURE TRAY) ×1 IMPLANT
PENCIL SMOKE EVACUATOR (MISCELLANEOUS) ×1 IMPLANT
PIN SAFETY STERILE (MISCELLANEOUS) ×1 IMPLANT
SHEET MEDIUM DRAPE 40X70 STRL (DRAPES) ×2 IMPLANT
SLEEVE SCD COMPRESS KNEE MED (STOCKING) ×1 IMPLANT
SPONGE T-LAP 18X18 ~~LOC~~+RFID (SPONGE) ×2 IMPLANT
STAPLER VISISTAT 35W (STAPLE) ×1 IMPLANT
SUT ETHILON 2 0 FS 18 (SUTURE) ×2 IMPLANT
SUT MNCRL AB 4-0 PS2 18 (SUTURE) ×2 IMPLANT
SUT PDS AB 0 CT 36 (SUTURE) ×1 IMPLANT
SUT PDS AB 2-0 CT2 27 (SUTURE) IMPLANT
SUT PLAIN 5 0 P 3 18 (SUTURE) IMPLANT
SUT VLOC 180 0 24IN GS25 (SUTURE) ×1 IMPLANT
SYR BULB IRRIG 60ML STRL (SYRINGE) ×1 IMPLANT
SYR CONTROL 10ML LL (SYRINGE) IMPLANT
TOWEL GREEN STERILE FF (TOWEL DISPOSABLE) ×1 IMPLANT
TRAY FOLEY W/BAG SLVR 14FR LF (SET/KITS/TRAYS/PACK) IMPLANT
TUBE CONNECTING 20X1/4 (TUBING) ×1 IMPLANT
UNDERPAD 30X36 HEAVY ABSORB (UNDERPADS AND DIAPERS) ×2 IMPLANT
YANKAUER SUCT BULB TIP NO VENT (SUCTIONS) ×1 IMPLANT

## 2022-01-27 NOTE — Op Note (Signed)
Operative Note   DATE OF OPERATION: 9.8.23  LOCATION: [facility]-observation  SURGICAL DIVISION: Plastic Surgery  PREOPERATIVE DIAGNOSES:  Panniculitis  POSTOPERATIVE DIAGNOSES:  same  PROCEDURE:  Panniculectomy  SURGEON: Irene Limbo MD MBA  ASSISTANT: none  ANESTHESIA:  General.   EBL: 45 ml  COMPLICATIONS: None immediate.   INDICATIONS FOR PROCEDURE:  The patient, Sarah Duncan, is a 56 y.o. female born on 1966-04-24, is here for recurrent panniculitis that has failed conservative measures.   FINDINGS: Abdominal soft tissue resection 2156 g  DESCRIPTION OF PROCEDURE:  The patient was marked standing in the preoperative area to mark caudal incision over abdomen marked 7 cm from labial fourchette and extended over lateral abdomen. SQ heparin administered. The patient was taken to the operating room. SCDs were placed and IV antibiotics were given. The patient's operative site was prepped and draped in a sterile fashion. A time out was performed and all information was confirmed to be correct.     Low transverse abdominal incision made caudal to prior scar and carried through superficial fascia to abdominal wall. Skin flap elevated in sub Scarpa's layer, taking care to leave layer of subfascial fat over abdominal wall fascia. Dissection completed toward umbilicus. Umbilicus sharply incised and scissor dissection completed to free from abdominal skin flap. Additional dissection completed in midline toward xiphoid. Wound irrigated and hemostasis obtained. Local anesthetic infiltrated. 39 Fr JP placed in subcutaneous right and left abdomen and secured with 2-0 nylon. Patient then brought to semi sitting position. Caudal extent skin excision marked by palpation. Area marked excised. Superiorly based U shaped skin flap incised for delivery umbilicus. Low transverse abdominal skin incision closed with 0 PDS in superficial fascia. 0 V lock used to close dermis and 4-0 monocryl for  subcuticular skin closure. Umbilicus inset with 4-0 monocryl in dermis and 5-0 plain gut for skin closure. Xeroform bolster placed within umbilicus. Dermabond applied.   Dry dressing and abdominal binder applied. The patient was allowed to wake from anesthesia, extubated and taken to the recovery room in satisfactory condition.   SPECIMENS: none  DRAINS: 72 Fr JP in right and left subcutaneous abdomen  Irene Limbo, MD [facility]astic [facility]  Office/ physician access line after hours [phone]

## 2022-01-27 NOTE — Anesthesia Procedure Notes (Signed)
Procedure Name: Intubation Date/Time: 01/27/2022 11:00 AM  Performed by: Willa Frater, CRNAPre-anesthesia Checklist: Patient identified, Emergency Drugs available, Suction available and Patient being monitored Patient Re-evaluated:Patient Re-evaluated prior to induction Oxygen Delivery Method: Circle system utilized Preoxygenation: Pre-oxygenation with 100% oxygen Induction Type: IV induction and Rapid sequence Laryngoscope Size: Mac and 3 Grade View: Grade I Tube type: Oral Tube size: 7.0 mm Number of attempts: 1 Airway Equipment and Method: Stylet Placement Confirmation: ETT inserted through vocal cords under direct vision, positive ETCO2 and breath sounds checked- equal and bilateral Secured at: 22 cm Tube secured with: Tape Dental Injury: Teeth and Oropharynx as per pre-operative assessment

## 2022-01-27 NOTE — Anesthesia Preprocedure Evaluation (Signed)
Anesthesia Evaluation  Patient identified by MRN, date of birth, ID band Patient awake    Reviewed: Allergy & Precautions, H&P , NPO status , Patient's Chart, lab work & pertinent test results  Airway Mallampati: II   Neck ROM: full    Dental   Pulmonary shortness of breath, former smoker,    breath sounds clear to auscultation       Cardiovascular hypertension,  Rhythm:regular Rate:Normal     Neuro/Psych Seizures -,  PSYCHIATRIC DISORDERS Anxiety Depression    GI/Hepatic   Endo/Other    Renal/GU      Musculoskeletal  (+) Arthritis ,   Abdominal   Peds  Hematology   Anesthesia Other Findings   Reproductive/Obstetrics                             Anesthesia Physical Anesthesia Plan  ASA: 2  Anesthesia Plan: General   Post-op Pain Management:    Induction: Intravenous  PONV Risk Score and Plan: 3 and Ondansetron, Dexamethasone, Midazolam and Treatment may vary due to age or medical condition  Airway Management Planned: Oral ETT  Additional Equipment:   Intra-op Plan:   Post-operative Plan: Extubation in OR  Informed Consent: I have reviewed the patients History and Physical, chart, labs and discussed the procedure including the risks, benefits and alternatives for the proposed anesthesia with the patient or authorized representative who has indicated his/her understanding and acceptance.     Dental advisory given  Plan Discussed with: CRNA, Anesthesiologist and Surgeon  Anesthesia Plan Comments:         Anesthesia Quick Evaluation

## 2022-01-27 NOTE — Transfer of Care (Signed)
Immediate Anesthesia Transfer of Care Note  [name]  Procedure(s) Performed: PANNICULECTOMY (Abdomen)  Patient Location: PACU  Anesthesia Type:General  Level of Consciousness: sedated  Airway & Oxygen Therapy: Patient Spontanous Breathing and Patient connected to face mask oxygen  Post-op Assessment: Report given to RN and Post -op Vital signs reviewed and stable  Post vital signs: Reviewed and stable  Last Vitals:  Vitals Value Taken Time  BP    Temp    Pulse    Resp    SpO2      Last Pain:  Vitals:   01/27/22 1053  TempSrc:   PainSc: 0-No pain         Complications: No notable events documented.

## 2022-01-27 NOTE — Interval H&P Note (Signed)
History and Physical Interval Note:  01/27/2022 10:19 AM  Sarah Duncan  has presented today for surgery, with the diagnosis of panniculitis.  The various methods of treatment have been discussed with the patient and family. After consideration of risks, benefits and other options for treatment, the patient has consented to  Procedure(s): PANNICULECTOMY (N/A) as a surgical intervention.  The patient's history has been reviewed, patient examined, no change in status, stable for surgery.  I have reviewed the patient's chart and labs.  Questions were answered to the patient's satisfaction.     Arnoldo Hooker Kathleen Likins

## 2022-01-28 DIAGNOSIS — M793 Panniculitis, unspecified: Secondary | ICD-10-CM | POA: Diagnosis not present

## 2022-01-28 LAB — GLUCOSE, CAPILLARY: Glucose-Capillary: 97 mg/dL (ref 70–99)

## 2022-01-28 NOTE — Discharge Summary (Signed)
Physician Discharge Summary  Patient ID: Sarah Duncan MRN: [id number] DOB/AGE: 54-Jul-1967 54 y.o.  Admit date: 01/27/2022 Discharge date: 01/28/2022  Admission Diagnoses: Panniculitis  Discharge Diagnoses:  Principal Problem:   Panniculitis   Discharged Condition: stable  Hospital Course: Post operatively patient tolerated diet, oral pain medication and was ambulatory with minimal assist. Instructed on bathing drain care and activity.  Treatments: surgery: panniculectomy 9.8.23  Discharge Exam: Blood pressure 131/83, pulse 66, temperature 97.6 F (36.4 C), resp. rate 18, height '5\' 7"'$  (1.702 m), weight 86.6 kg, SpO2 100 %. Incision/Wound: abdomen soft umbilicus viable incisions intact scnt drainage on dressings drains serosanguinous  Disposition: Discharge disposition: 01-Home or Self Care       Discharge Instructions     Call MD for:  redness, tenderness, or signs of infection (pain, swelling, bleeding, redness, odor or green/yellow discharge around incision site)   Complete by: As directed    Call MD for:  temperature >100.5   Complete by: As directed    Discharge instructions   Complete by: As directed    Ok to remove dressings and shower am 9.9.23. Soap and water ok, pat incisions dry. No creams or ointments over incisions. Do not let drains dangle in shower, attach to lanyard or similar.Strip and record drains twice daily and bring log to clinic visit.  Abdominal binder or compression garment all other times.  Ok to raise arms above shoulders for bathing and dressing.  No house yard work or exercise until cleared by MD.   Patient received all Rx preop. Also ok to use Tylenol as directed for pain. Recommend Miralax or Dulcolax as needed for constipation.   Driving Restrictions   Complete by: As directed    No driving if taking prescription pain medication   Lifting restrictions   Complete by: As directed    No lifting > 5-10 lb until cleared by MD    Nursing communication   Complete by: As directed    Please give am dose Lovenox prior to d/c   Resume previous diet   Complete by: As directed       Allergies as of 01/28/2022   No Known Allergies      Medication List     STOP taking these medications    triamcinolone acetonide 40 MG/ML injection (RADIOLOGY ONLY) Commonly known as: KENALOG-40       TAKE these medications    busPIRone 15 MG tablet Commonly known as: BUSPAR Take 15 mg by mouth 2 (two) times daily.   DULoxetine 60 MG capsule Commonly known as: CYMBALTA Take by mouth.   Melatonin 10 MG Tabs Take 20 mg by mouth daily as needed (sleep).   modafinil 200 MG tablet Commonly known as: PROVIGIL Take by mouth.   Multiple Vitamin tablet Take 1 tablet by mouth daily.   polyethylene glycol 17 g packet Commonly known as: MIRALAX / GLYCOLAX Take 17 g by mouth daily as needed for mild constipation.   rosuvastatin 10 MG tablet Commonly known as: CRESTOR Take by mouth.   Semaglutide(0.25 or 0.'5MG'$ /DOS) 2 MG/1.5ML Sopn Inject into the skin.   tiZANidine 4 MG tablet Commonly known as: ZANAFLEX Take 0.5-1 tablets (2-4 mg total) by mouth every 6 (six) hours as needed for muscle spasms. Not more than 3 times daily.        Follow-up Information     Irene Limbo, MD Follow up in 1 week(s).   Specialty: Plastic Surgery Why: as scheduled Contact information: [address] [place] [address] [phone]                 Signed: Irene Limbo 01/28/2022, 7:52 AM

## 2022-01-28 NOTE — Anesthesia Postprocedure Evaluation (Signed)
Anesthesia Post Note  [name]  Procedure(s) Performed: PANNICULECTOMY (Abdomen)     Patient location during evaluation: PACU Anesthesia Type: General Level of consciousness: awake and alert Pain management: pain level controlled Vital Signs Assessment: post-procedure vital signs reviewed and stable Respiratory status: spontaneous breathing, nonlabored ventilation, respiratory function stable and patient connected to nasal cannula oxygen Cardiovascular status: blood pressure returned to baseline and stable Postop Assessment: no apparent nausea or vomiting Anesthetic complications: no   No notable events documented.  Last Vitals:  Vitals:   01/28/22 0630 01/28/22 0700  BP:  131/83  Pulse: 68 66  Resp:    Temp:  36.4 C  SpO2: 99% 100%    Last Pain:  Vitals:   01/28/22 0700  TempSrc:   PainSc: [place]

## 2022-01-30 ENCOUNTER — Encounter (HOSPITAL_BASED_OUTPATIENT_CLINIC_OR_DEPARTMENT_OTHER): Payer: Self-pay | Admitting: Plastic Surgery

## 2022-02-16 ENCOUNTER — Ambulatory Visit: Payer: Medicare (Managed Care) | Admitting: Specialist

## 2022-02-21 ENCOUNTER — Ambulatory Visit: Payer: Medicare (Managed Care) | Admitting: Physical Medicine & Rehabilitation
